# Patient Record
Sex: Male | Born: 1940 | Race: Black or African American | Hispanic: No | Smoking: Former smoker
Health system: Southern US, Community
[De-identification: ages and names within clinical notes are randomized; demographics above are authoritative.]

## PROBLEM LIST (undated history)

## (undated) DIAGNOSIS — J309 Allergic rhinitis, unspecified: Secondary | ICD-10-CM

## (undated) DIAGNOSIS — J439 Emphysema, unspecified: Secondary | ICD-10-CM

## (undated) DIAGNOSIS — N529 Male erectile dysfunction, unspecified: Secondary | ICD-10-CM

## (undated) DIAGNOSIS — M48061 Spinal stenosis, lumbar region without neurogenic claudication: Secondary | ICD-10-CM

## (undated) DIAGNOSIS — G4700 Insomnia, unspecified: Secondary | ICD-10-CM

## (undated) DIAGNOSIS — N400 Enlarged prostate without lower urinary tract symptoms: Secondary | ICD-10-CM

## (undated) DIAGNOSIS — Q396 Congenital diverticulum of esophagus: Secondary | ICD-10-CM

## (undated) DIAGNOSIS — I700 Atherosclerosis of aorta: Secondary | ICD-10-CM

## (undated) DIAGNOSIS — L568 Other specified acute skin changes due to ultraviolet radiation: Secondary | ICD-10-CM

## (undated) DIAGNOSIS — I495 Sick sinus syndrome: Secondary | ICD-10-CM

## (undated) DIAGNOSIS — M5030 Other cervical disc degeneration, unspecified cervical region: Secondary | ICD-10-CM

## (undated) DIAGNOSIS — C801 Malignant (primary) neoplasm, unspecified: Secondary | ICD-10-CM

## (undated) DIAGNOSIS — I10 Essential (primary) hypertension: Secondary | ICD-10-CM

## (undated) DIAGNOSIS — C61 Malignant neoplasm of prostate: Secondary | ICD-10-CM

## (undated) DIAGNOSIS — D369 Benign neoplasm, unspecified site: Secondary | ICD-10-CM

## (undated) DIAGNOSIS — M1990 Unspecified osteoarthritis, unspecified site: Secondary | ICD-10-CM

## (undated) DIAGNOSIS — G4733 Obstructive sleep apnea (adult) (pediatric): Secondary | ICD-10-CM

## (undated) DIAGNOSIS — E785 Hyperlipidemia, unspecified: Secondary | ICD-10-CM

## (undated) DIAGNOSIS — Z950 Presence of cardiac pacemaker: Secondary | ICD-10-CM

## (undated) DIAGNOSIS — G4730 Sleep apnea, unspecified: Secondary | ICD-10-CM

## (undated) DIAGNOSIS — E876 Hypokalemia: Secondary | ICD-10-CM

## (undated) DIAGNOSIS — I447 Left bundle-branch block, unspecified: Secondary | ICD-10-CM

## (undated) HISTORY — PX: INSERT / REPLACE / REMOVE PACEMAKER: SUR710

## (undated) HISTORY — PX: ANTERIOR CERVICAL DISCECTOMY: SHX1160

## (undated) HISTORY — PX: BACK SURGERY: SHX140

---

## 2005-02-03 ENCOUNTER — Ambulatory Visit: Payer: Self-pay | Admitting: Gastroenterology

## 2005-12-21 HISTORY — PX: HAMMER TOE SURGERY: SHX385

## 2008-02-28 ENCOUNTER — Ambulatory Visit: Payer: Self-pay | Admitting: Internal Medicine

## 2008-03-01 ENCOUNTER — Ambulatory Visit: Payer: Self-pay | Admitting: Gastroenterology

## 2008-04-11 ENCOUNTER — Ambulatory Visit (HOSPITAL_COMMUNITY): Admission: RE | Admit: 2008-04-11 | Discharge: 2008-04-12 | Payer: Self-pay | Admitting: Neurosurgery

## 2009-12-21 HISTORY — PX: PACEMAKER INSERTION: SHX728

## 2010-12-25 ENCOUNTER — Ambulatory Visit: Payer: Self-pay | Admitting: Unknown Physician Specialty

## 2011-03-06 ENCOUNTER — Ambulatory Visit: Payer: Self-pay | Admitting: Cardiology

## 2011-03-10 ENCOUNTER — Ambulatory Visit: Payer: Self-pay | Admitting: Cardiology

## 2011-03-14 ENCOUNTER — Emergency Department: Payer: Self-pay | Admitting: Emergency Medicine

## 2011-05-05 NOTE — Op Note (Signed)
Richard Alexander, Richard Alexander               ACCOUNT NO.:  [id number]   MEDICAL RECORD NO.:  [id number]          PATIENT TYPE:  OIB   LOCATION:  3172                         FACILITY:  [facility]   PHYSICIAN:  Hewitt Shorts, M.D.DATE OF BIRTH:  May 03, 1941   DATE OF PROCEDURE:  04/11/2008  DATE OF DISCHARGE:                               OPERATIVE REPORT   PREOPERATIVE DIAGNOSES:  1. C3-C4 cervical disk herniation.  2. Cervical spondylosis.  3. Cervical degenerative disease.  4. Neck pain.   POSTOPERATIVE DIAGNOSES:  1. C3-C4 cervical disk herniation.  2. Cervical spondylosis.  3. Cervical degenerative disease.  4. Neck pain.   PROCEDURE:  C3-C4 anterior cervical diskectomy arthrodesis with  allograft and Tether cervical plating.   SURGEON:  Hewitt Shorts, MD   ASSISTANT:  Clydene Fake, MD   ANESTHESIA:  General endotracheal.   HISTORY OF PRESENT ILLNESS:  The patient is a 70 year old man who  presented with neck and radicular pain, was found to have a broad based  C3-C4 cervical disk herniation with relative canal stenosis.  Decision  made to proceed with single level anterior cervical diskectomy  arthrodesis.   DESCRIPTION OF PROCEDURE:  The patient was brought to the operating room  and placed under general endotracheal anesthesia.  The patient was  placed in 10 pounds of Holter traction.  The neck was prepped with  Betadine soap and solution and draped in sterile fashion.  A horizontal  incision was made in the left side of the neck.  The line of the  incision was infiltrated with local anesthetic with epinephrine.  Dissection was carried down through the subcutaneous tissue with bipolar  electrocautery.  Electrocautery was used to maintain hemostasis.  Dissection was carried down to the platysma and then dissection was  carried out through the avascular plane leaving the sternocleidomastoid,  carotid artery, and jugular vein laterally and the trachea and esophagus  medially.  The ventral aspect of the vertebral column was identified and  localizing x-rays were taken and the C3-4 intervertebral disk space  identified.  Diskectomy was begun.  The annulus was incised.  We entered  into the disk space, and using pituitary rongeur and micro curettes,  diskectomy was performed.  Anterior osteophytic overgrowth was removed  using a Kerrison punch and then the operating microscope was draped and  brought into the field to provide additional magnification,  illumination, and visualization.  The remainder of the decompression was  performed using microdissection and microsurgical technique.  The  cartilaginous endplates were then further removed using microcurettes  along with the X-Max drill and then posterior osteophytic overgrowth was  removed using the X-Max drill along with a 2-mm Kerrison punch with a  central footplate.  The posterior longitudinal ligament was carefully  removed and we decompressed the spinal canal and thecal sac.  The  decompression extended laterally and we identified the axillary C4  nerves and those were felt to have been decompressed, and the wounds  were irrigated with Bacitracin solution.  Hemostasis was established  with the use of Gelfoam soaked in thrombin, and once decompression  had  been completed, we measured the height of the intervertebral disk  spaces, selected an 8-mm interbody implant.  This allograft was hydrated  with saline solution and then positioned in the intervertebral disk  space and countersunk.  We then selected a 14-mm Tether cervical plate.  It was secured to the vertebra with 4-mm variable-angle screws. We used  a pair of 15-mm screws at C3, a 15-mm screw on the left side of the C4,  and a 12-mm screw on the right side of C4, and x-ray showed the graft,  plate, and screws all to be in good position.  All 4 screws had final  tightening performed, and then the wound was irrigated with Bacitracin  solution  and checked for hemostasis which was established and confirmed,  and then we proceeded with closure.  The platysma was closed with  interrupted and inverted 2-0 undyed Vicryl sutures.  The subcutaneous  and subcuticular layer were closed with interrupted inverted 3-0 undyed  Vicryl sutures.  The skin was reapproximated with Dermabond.  The  procedure was tolerated well.  The estimated blood loss was 50 mL.  Sponge and needle count were correct.  Following surgery, the patient  was placed in a soft cervical collar, reversed from the anesthetic,  extubated, and transferred to the recovery room for further care.      Hewitt Shorts, M.D.  Electronically Signed     RWN/MEDQ  D:  04/11/2008  T:  04/12/2008  Job:  [id number]

## 2011-09-07 ENCOUNTER — Ambulatory Visit: Payer: Self-pay | Admitting: Urology

## 2011-09-15 LAB — CBC
HCT: 42.8
Hemoglobin: 14.4
MCHC: 33.5
MCV: 92.9
Platelets: 303
RBC: 4.61
RDW: 14
WBC: 8.1

## 2013-06-08 ENCOUNTER — Ambulatory Visit: Payer: Self-pay | Admitting: Unknown Physician Specialty

## 2014-04-18 DIAGNOSIS — E7800 Pure hypercholesterolemia, unspecified: Secondary | ICD-10-CM | POA: Insufficient documentation

## 2014-04-18 DIAGNOSIS — I495 Sick sinus syndrome: Secondary | ICD-10-CM | POA: Insufficient documentation

## 2014-04-18 DIAGNOSIS — I10 Essential (primary) hypertension: Secondary | ICD-10-CM | POA: Insufficient documentation

## 2014-09-16 DIAGNOSIS — M5030 Other cervical disc degeneration, unspecified cervical region: Secondary | ICD-10-CM | POA: Insufficient documentation

## 2014-09-16 DIAGNOSIS — G4733 Obstructive sleep apnea (adult) (pediatric): Secondary | ICD-10-CM | POA: Insufficient documentation

## 2014-09-16 DIAGNOSIS — Z9989 Dependence on other enabling machines and devices: Secondary | ICD-10-CM

## 2014-09-16 DIAGNOSIS — G4700 Insomnia, unspecified: Secondary | ICD-10-CM | POA: Insufficient documentation

## 2014-09-16 DIAGNOSIS — Z86010 Personal history of colonic polyps: Secondary | ICD-10-CM | POA: Insufficient documentation

## 2014-09-17 DIAGNOSIS — E876 Hypokalemia: Secondary | ICD-10-CM | POA: Insufficient documentation

## 2014-09-17 DIAGNOSIS — D6489 Other specified anemias: Secondary | ICD-10-CM | POA: Insufficient documentation

## 2014-09-17 DIAGNOSIS — D649 Anemia, unspecified: Secondary | ICD-10-CM | POA: Insufficient documentation

## 2014-09-17 DIAGNOSIS — T502X5A Adverse effect of carbonic-anhydrase inhibitors, benzothiadiazides and other diuretics, initial encounter: Secondary | ICD-10-CM | POA: Insufficient documentation

## 2014-09-17 DIAGNOSIS — Z8639 Personal history of other endocrine, nutritional and metabolic disease: Secondary | ICD-10-CM | POA: Insufficient documentation

## 2015-09-25 DIAGNOSIS — Z79899 Other long term (current) drug therapy: Secondary | ICD-10-CM | POA: Insufficient documentation

## 2016-01-23 DIAGNOSIS — M5417 Radiculopathy, lumbosacral region: Secondary | ICD-10-CM | POA: Insufficient documentation

## 2016-04-09 DIAGNOSIS — R200 Anesthesia of skin: Secondary | ICD-10-CM | POA: Insufficient documentation

## 2016-04-15 ENCOUNTER — Other Ambulatory Visit: Payer: Self-pay | Admitting: Neurology

## 2016-04-15 DIAGNOSIS — M5417 Radiculopathy, lumbosacral region: Secondary | ICD-10-CM

## 2016-05-01 ENCOUNTER — Ambulatory Visit
Admission: RE | Admit: 2016-05-01 | Discharge: 2016-05-01 | Disposition: A | Discharge status: Home / Self Care | Payer: BC Managed Care – PPO | Source: Ambulatory Visit | Attending: Neurology | Admitting: Neurology

## 2016-05-01 DIAGNOSIS — M5417 Radiculopathy, lumbosacral region: Secondary | ICD-10-CM

## 2016-05-12 ENCOUNTER — Other Ambulatory Visit: Payer: Self-pay | Admitting: Internal Medicine

## 2016-05-12 DIAGNOSIS — M5417 Radiculopathy, lumbosacral region: Secondary | ICD-10-CM

## 2016-06-01 ENCOUNTER — Ambulatory Visit
Admission: RE | Admit: 2016-06-01 | Discharge: 2016-06-01 | Disposition: A | Discharge status: Home / Self Care | Payer: BC Managed Care – PPO | Source: Ambulatory Visit | Attending: Internal Medicine | Admitting: Internal Medicine

## 2016-06-01 DIAGNOSIS — M5417 Radiculopathy, lumbosacral region: Secondary | ICD-10-CM | POA: Diagnosis not present

## 2016-06-01 DIAGNOSIS — M5126 Other intervertebral disc displacement, lumbar region: Secondary | ICD-10-CM | POA: Diagnosis not present

## 2016-06-01 HISTORY — DX: Essential (primary) hypertension: I10

## 2016-06-01 LAB — POCT I-STAT CREATININE: CREATININE: 0.9 mg/dL (ref 0.61–1.24)

## 2016-06-01 MED ORDER — IOPAMIDOL (ISOVUE-300) INJECTION 61%
100.0000 mL | Freq: Once | INTRAVENOUS | Status: AC | PRN
  Administered 2016-06-01: 100 mL via INTRAVENOUS

## 2016-07-21 ENCOUNTER — Ambulatory Visit: Payer: BC Managed Care – PPO | Admitting: Pain Medicine

## 2016-09-14 ENCOUNTER — Other Ambulatory Visit: Payer: Self-pay | Admitting: Neurosurgery

## 2016-09-14 DIAGNOSIS — M48062 Spinal stenosis, lumbar region with neurogenic claudication: Secondary | ICD-10-CM

## 2016-09-14 DIAGNOSIS — G9519 Other vascular myelopathies: Secondary | ICD-10-CM

## 2016-09-14 DIAGNOSIS — M4800 Spinal stenosis, site unspecified: Secondary | ICD-10-CM

## 2016-09-21 ENCOUNTER — Other Ambulatory Visit: Payer: BC Managed Care – PPO

## 2016-09-22 ENCOUNTER — Ambulatory Visit: Payer: BC Managed Care – PPO

## 2016-09-25 ENCOUNTER — Ambulatory Visit: Payer: BC Managed Care – PPO

## 2016-09-25 ENCOUNTER — Other Ambulatory Visit: Payer: BC Managed Care – PPO

## 2016-09-29 ENCOUNTER — Ambulatory Visit
Admission: RE | Admit: 2016-09-29 | Discharge: 2016-09-29 | Disposition: A | Discharge status: Home / Self Care | Payer: BC Managed Care – PPO | Source: Ambulatory Visit | Attending: Neurosurgery | Admitting: Neurosurgery

## 2016-09-29 DIAGNOSIS — M48062 Spinal stenosis, lumbar region with neurogenic claudication: Secondary | ICD-10-CM | POA: Insufficient documentation

## 2016-09-29 DIAGNOSIS — M5127 Other intervertebral disc displacement, lumbosacral region: Secondary | ICD-10-CM | POA: Insufficient documentation

## 2016-09-29 DIAGNOSIS — M1288 Other specific arthropathies, not elsewhere classified, other specified site: Secondary | ICD-10-CM | POA: Insufficient documentation

## 2016-09-29 DIAGNOSIS — G9519 Other vascular myelopathies: Secondary | ICD-10-CM

## 2016-09-29 DIAGNOSIS — M4800 Spinal stenosis, site unspecified: Secondary | ICD-10-CM

## 2016-09-29 MED ORDER — IOPAMIDOL (ISOVUE-M 300) INJECTION 61%
15.0000 mL | Freq: Once | INTRAMUSCULAR | Status: AC | PRN
  Administered 2016-09-29: 20 mL via INTRATHECAL

## 2016-10-02 ENCOUNTER — Other Ambulatory Visit: Payer: BC Managed Care – PPO

## 2016-10-19 ENCOUNTER — Encounter
Admission: RE | Admit: 2016-10-19 | Discharge: 2016-10-19 | Disposition: A | Discharge status: Home / Self Care | Payer: BC Managed Care – PPO | Source: Ambulatory Visit | Attending: Neurosurgery | Admitting: Neurosurgery

## 2016-10-19 DIAGNOSIS — Z950 Presence of cardiac pacemaker: Secondary | ICD-10-CM | POA: Diagnosis not present

## 2016-10-19 DIAGNOSIS — N529 Male erectile dysfunction, unspecified: Secondary | ICD-10-CM | POA: Diagnosis not present

## 2016-10-19 DIAGNOSIS — M1990 Unspecified osteoarthritis, unspecified site: Secondary | ICD-10-CM | POA: Diagnosis not present

## 2016-10-19 DIAGNOSIS — G4733 Obstructive sleep apnea (adult) (pediatric): Secondary | ICD-10-CM | POA: Diagnosis not present

## 2016-10-19 DIAGNOSIS — Z01812 Encounter for preprocedural laboratory examination: Secondary | ICD-10-CM | POA: Insufficient documentation

## 2016-10-19 DIAGNOSIS — Z9989 Dependence on other enabling machines and devices: Secondary | ICD-10-CM | POA: Insufficient documentation

## 2016-10-19 DIAGNOSIS — I495 Sick sinus syndrome: Secondary | ICD-10-CM | POA: Diagnosis not present

## 2016-10-19 DIAGNOSIS — M5030 Other cervical disc degeneration, unspecified cervical region: Secondary | ICD-10-CM | POA: Insufficient documentation

## 2016-10-19 DIAGNOSIS — R200 Anesthesia of skin: Secondary | ICD-10-CM | POA: Insufficient documentation

## 2016-10-19 DIAGNOSIS — E785 Hyperlipidemia, unspecified: Secondary | ICD-10-CM | POA: Insufficient documentation

## 2016-10-19 DIAGNOSIS — D6489 Other specified anemias: Secondary | ICD-10-CM | POA: Diagnosis not present

## 2016-10-19 DIAGNOSIS — M5417 Radiculopathy, lumbosacral region: Secondary | ICD-10-CM | POA: Diagnosis not present

## 2016-10-19 DIAGNOSIS — I10 Essential (primary) hypertension: Secondary | ICD-10-CM | POA: Insufficient documentation

## 2016-10-19 HISTORY — DX: Presence of cardiac pacemaker: Z95.0

## 2016-10-19 LAB — URINALYSIS COMPLETE WITH MICROSCOPIC (ARMC ONLY)
BILIRUBIN URINE: NEGATIVE
Bacteria, UA: NONE SEEN
GLUCOSE, UA: NEGATIVE mg/dL
KETONES UR: NEGATIVE mg/dL
Leukocytes, UA: NEGATIVE
NITRITE: NEGATIVE
PROTEIN: NEGATIVE mg/dL
SPECIFIC GRAVITY, URINE: 1.013 (ref 1.005–1.030)
pH: 6 (ref 5.0–8.0)

## 2016-10-19 LAB — COMPREHENSIVE METABOLIC PANEL
ALK PHOS: 61 U/L (ref 38–126)
ALT: 22 U/L (ref 17–63)
ANION GAP: 9 (ref 5–15)
AST: 24 U/L (ref 15–41)
Albumin: 4 g/dL (ref 3.5–5.0)
BILIRUBIN TOTAL: 1 mg/dL (ref 0.3–1.2)
BUN: 12 mg/dL (ref 6–20)
CALCIUM: 9.8 mg/dL (ref 8.9–10.3)
CO2: 28 mmol/L (ref 22–32)
Chloride: 96 mmol/L — ABNORMAL LOW (ref 101–111)
Creatinine, Ser: 0.89 mg/dL (ref 0.61–1.24)
GLUCOSE: 100 mg/dL — AB (ref 65–99)
POTASSIUM: 3.9 mmol/L (ref 3.5–5.1)
Sodium: 133 mmol/L — ABNORMAL LOW (ref 135–145)
TOTAL PROTEIN: 7.9 g/dL (ref 6.5–8.1)

## 2016-10-19 LAB — CBC
HEMATOCRIT: 42.8 % (ref 40.0–52.0)
HEMOGLOBIN: 14.8 g/dL (ref 13.0–18.0)
MCH: 32.2 pg (ref 26.0–34.0)
MCHC: 34.5 g/dL (ref 32.0–36.0)
MCV: 93.2 fL (ref 80.0–100.0)
Platelets: 274 10*3/uL (ref 150–440)
RBC: 4.59 MIL/uL (ref 4.40–5.90)
RDW: 14.4 % (ref 11.5–14.5)
WBC: 9.8 10*3/uL (ref 3.8–10.6)

## 2016-10-19 LAB — PROTIME-INR
INR: 0.92
PROTHROMBIN TIME: 12.3 s (ref 11.4–15.2)

## 2016-10-19 LAB — SURGICAL PCR SCREEN
MRSA, PCR: NEGATIVE
STAPHYLOCOCCUS AUREUS: NEGATIVE

## 2016-10-19 LAB — TYPE AND SCREEN
ABO/RH(D): B POS
Antibody Screen: NEGATIVE

## 2016-10-19 LAB — APTT: aPTT: 33 seconds (ref 24–36)

## 2016-10-19 NOTE — Pre-Procedure Instructions (Addendum)
Sydnee Levans, MD - 09/23/2016 9:00 AM EDT Formatting of this note may be different from the original.   Chief Complaint: Chief Complaint  Patient presents with  . Follow-up  Myoview  Date of Service: 09/23/2016 Date of Birth: Dec 04, 1941 PCP: DAVID Raeanne Barry, MD  History of Present Illness: Mr. Verble is a 75 y.o.male patient who presents for follow-up visit. Is doing fairly well from a cardiac standpoint. He denies chest pain. He has no major complaints. 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 Device is programmed at a lower rate of 60 upper rate of 130 with a sense rate of 130 beats per minute . He has no chest pain or shortness of breath. He is able to carry out his daily activities without difficulty. He is contemplating back surgery. He underwent a functional study to assess for evidence of ischemia. 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 He is absolutely asymptomatic with moderate activity. I feel he is a low risk for surgery from a cardiac standpoint.   5. Patient appears to be a low risk for surgery from a cardiac standpoint. Would proceed with routine cardiac monitoring.

## 2016-10-19 NOTE — Pre-Procedure Instructions (Signed)
    [facility] [address] [place], [id number]  Procedure: Pharmacologic Myocardial Perfusion Imaging ONE day procedure  Indication: Atypical chest pain Plan: NM myocardial perfusion SPECT multiple (stress  and rest), ECG stress test only  Ordering Physician:   Dr. Bartholome Bill   Clinical History: 74 y.o. year old male Vitals: Height: 22 in Weight: 215 lb Cardiac risk factors include:  Pacemaker, Hyperlipidemia, HTN, Family Hx CAD and Obesity    Procedure:  Pharmacologic stress testing was performed with Regadenoson using a single  use 0.4mg /28ml (0.08 mg/ml) prefilled syringe intravenously infused as a  bolus dose. The stress test was stopped due to Infusion completion.Blood  pressure response was normal.  Rest HR: 76bpm Rest BP: 140/73mmHg Max HR: 87bpm Min BP: 130/42mmHg  Stress Test Administered by: Oswald Hillock, CMA  ECG Interpretation: Rest PH:[id number] sinus rhythm, LBBB Stress PH:[id number] sinus rhythm, LBBB Recovery PH:[id number] sinus rhythm ECG Interpretation:non-diagnostic due to pharmacologic testing.   Administrations This Visit  regadenoson (LEXISCAN) 0.4 mg/5 mL inj syringe 0.4 mg  Admin Date Action Dose Route Administered By      09/17/2016 Given 0.4 mg Intravenous Roxine Caddy Goard, CNMT      technetium Tc20m sestamibi (CARDIOLITE) injection [id number] millicurie  Admin Date Action Dose Route Administered By      0000000 Given [id number] millicurie Intravenous Scott N Goard, CNMT      technetium Tc62m sestamibi (CARDIOLITE) injection 0000000 millicurie  Admin Date Action Dose Route Administered By      0000000 Given 0000000 millicurie Intravenous Scott N Goard, CNMT        Gated post-stress perfusion imaging was performed 30 minutes after stress.  Rest images were performed 30 minutes after injection.  Gated  LV Analysis:  TID Ratio: 1.13  LVEF= 45%  FINDINGS: Regional wall motion:demonstrateshypokinesis of the inferoseptal wall. The overall quality of the study is good. Artifacts noted: no Left ventricular cavity: normal.  Perfusion Analysis:SPECT images demonstrate moderate perfusion  abnormality of moderate intensity is present in the inferoseptalregion  on the stress images.  09/17/16

## 2016-10-19 NOTE — Patient Instructions (Signed)
  Your procedure is scheduled on:Wednesday Nov. 8 , 2017. Report to Same Day Surgery. To find out your arrival time please call [phone] between 1PM - 3PM on Tuesday Nov. 7, 2017.  Remember: Instructions that are not followed completely may result in serious medical risk, up to and including death, or upon the discretion of your surgeon and anesthesiologist your surgery may need to be rescheduled.    _x___ 1. Do not eat food or drink liquids after midnight. No gum chewing or hard candies.     ____ 2. No Alcohol for 24 hours before or after surgery.   ____ 3. Bring all medications with you on the day of surgery if instructed.    __x__ 4. Notify your doctor if there is any change in your medical condition     (cold, fever, infections).    _____ 5. No smoking 24 hours prior to surgery.     Do not wear jewelry, make-up, hairpins, clips or nail polish.  Do not wear lotions, powders, or perfumes.   Do not shave 48 hours prior to surgery. Men may shave face and neck.  Do not bring valuables to the hospital.    [facility] is not responsible for any belongings or valuables.               Contacts, dentures or bridgework may not be worn into surgery.  Leave your suitcase in the car. After surgery it may be brought to your room.  For patients admitted to the hospital, discharge time is determined by your treatment team.   Patients discharged the day of surgery will not be allowed to drive home.    Please read over the following fact sheets that you were given:   [facility] Preparing for Surgery  __x__ Take these medicines the morning of surgery with A SIP OF WATER:    1. gabapentin (NEURONTIN)    ____ Fleet Enema (as directed)   _x___ Use CHG Soap as directed on instruction sheet  ____ Use inhalers on the day of surgery and bring to hospital day of surgery  ____ Stop metformin 2 days prior to surgery    ____ Take 1/2 of usual insulin dose the night before surgery and none on  the morning of  surgery.   __x__ Stop aspirin 7 days prior to surgery.  _x___ Stop Anti-inflammatories such as Advil, Aleve, Ibuprofen, Motrin, Naproxen, Naprosyn, Goodies powders or aspirin products. OK to take Tylenol.   ____ Stop supplements until after surgery.    _x___ Bring C-Pap to the hospital.

## 2016-10-20 ENCOUNTER — Encounter: Payer: Self-pay | Admitting: Urology

## 2016-10-20 ENCOUNTER — Ambulatory Visit (INDEPENDENT_AMBULATORY_CARE_PROVIDER_SITE_OTHER): Payer: Medicare Other | Admitting: Urology

## 2016-10-20 VITALS — BP 155/83 | HR 81 | Ht 65.0 in | Wt 218.3 lb

## 2016-10-20 DIAGNOSIS — J309 Allergic rhinitis, unspecified: Secondary | ICD-10-CM | POA: Insufficient documentation

## 2016-10-20 DIAGNOSIS — N529 Male erectile dysfunction, unspecified: Secondary | ICD-10-CM | POA: Diagnosis not present

## 2016-10-20 DIAGNOSIS — R972 Elevated prostate specific antigen [PSA]: Secondary | ICD-10-CM

## 2016-10-20 DIAGNOSIS — L568 Other specified acute skin changes due to ultraviolet radiation: Secondary | ICD-10-CM | POA: Insufficient documentation

## 2016-10-20 DIAGNOSIS — M75102 Unspecified rotator cuff tear or rupture of left shoulder, not specified as traumatic: Secondary | ICD-10-CM | POA: Insufficient documentation

## 2016-10-20 DIAGNOSIS — N401 Enlarged prostate with lower urinary tract symptoms: Secondary | ICD-10-CM

## 2016-10-20 DIAGNOSIS — N138 Other obstructive and reflux uropathy: Secondary | ICD-10-CM

## 2016-10-20 DIAGNOSIS — M1990 Unspecified osteoarthritis, unspecified site: Secondary | ICD-10-CM | POA: Insufficient documentation

## 2016-10-20 MED ORDER — FINASTERIDE 5 MG PO TABS: 5.0000 mg | ORAL_TABLET | Freq: Every day | ORAL | 12 refills | Status: DC

## 2016-10-20 NOTE — Patient Instructions (Addendum)
Finasteride (Proscar) tablets What is this medicine? FINASTERIDE (fi NAS teer ide) is used to treat benign prostatic hyperplasia (BPH) in men. This is a condition that causes you to have an enlarged prostate. This medicine helps to control your symptoms, decrease urinary retention, and reduces your risk of needing surgery. When used in combination with certain other medicines, this drug can slow down the progression of your disease. This medicine may be used for other purposes; ask your health care provider or pharmacist if you have questions. What should I tell my health care provider before I take this medicine? They need to know if you have any of these conditions: -liver disease -an unusual or allergic reaction to finasteride, other medicines, foods, dyes, or preservatives -pregnant or trying to get pregnant -breast-feeding How should I use this medicine? Take this medicine by mouth with a glass of water. Follow the directions on the prescription label. You can take this medicine with or without food. Take your doses at regular intervals. Do not take your medicine more often than directed. Do not stop taking except on the advice of your doctor or health care professional. Talk to your pediatrician regarding the use of this medicine in children. Special care may be needed. Overdosage: If you think you have taken too much of this medicine contact a poison control center or emergency room at once. NOTE: This medicine is only for you. Do not share this medicine with others. What if I miss a dose? If you miss a dose, take it as soon as you can. If it is almost time for your next dose, take only that dose. Do not take double or extra doses. What may interact with this medicine? -saw palmetto or other dietary supplements This list may not describe all possible interactions. Give your health care provider a list of all the medicines, herbs, non-prescription drugs, or dietary supplements you use. Also  tell them if you smoke, drink alcohol, or use illegal drugs. Some items may interact with your medicine. What should I watch for while using this medicine? Do not donate blood while you are taking this medicine. This will prevent giving this medicine to a pregnant male through a blood transfusion. Ask your doctor or health care professional when it is safe to donate blood after you stop taking this medicine. Women who are pregnant or may get pregnant must not handle broken or crushed finasteride tablets. The active ingredient could harm the unborn baby. If a pregnant woman comes into contact with broken or crushed tablets she should check with her doctor or health care professional. Exposure to whole tablets is not expected to cause harm as long as they are not swallowed. Contact your doctor or health care professional if your symptoms do not start to get better. You may need to take this medicine for 6 to 12 months to get the best results. This medicine can interfere with PSA laboratory tests for prostate cancer. If you are scheduled to have a lab test for prostate cancer, tell your doctor or health care professional that you are taking this medicine. This medicine may increase your risk of getting some cancers, like breast cancer. Talk with your doctor. What side effects may I notice from receiving this medicine? Side effects that you should report to your doctor or health care professional as soon as possible: -any signs of an allergic reaction like rash, itching, hives or swelling of the lips or face -changes in breast like lumps, pain or fluids  leaking from the nipple -pain in the testicles Side effects that usually do not require medical attention (report to your doctor or health care professional if they continue or are bothersome): -sexual difficulties like decreased sexual desire or ability to get an erection -small amount of semen released during sex This list may not describe all possible  side effects. Call your doctor for medical advice about side effects. You may report side effects to FDA at 1-800-FDA-1088. Where should I keep my medicine? Keep out of the reach of children. Store at room temperature below 30 degrees C (86 degrees F). Protect from light. Keep container tightly closed. Throw away any unused medicine after the expiration date. NOTE: This sheet is a summary. It may not cover all possible information. If you have questions about this medicine, talk to your doctor, pharmacist, or health care provider.    2016, Elsevier/Gold Standard. (2015-07-25 17:24:30) Sildenafil tablets (Viagra) What is this medicine? SILDENAFIL (sil DEN a fil) is used to treat erection problems in men. This medicine may be used for other purposes; ask your health care provider or pharmacist if you have questions. What should I tell my health care provider before I take this medicine? They need to know if you have any of these conditions: -bleeding disorders -eye or vision problems, including a rare inherited eye disease called retinitis pigmentosa -anatomical deformation of the penis, Peyronie's disease, or history of priapism (painful and prolonged erection) -heart disease, angina, a history of heart attack, irregular heart beats, or other heart problems -high or low blood pressure -history of blood diseases, like sickle cell anemia or leukemia -history of stomach bleeding -kidney disease -liver disease -stroke -an unusual or allergic reaction to sildenafil, other medicines, foods, dyes, or preservatives -pregnant or trying to get pregnant -breast-feeding How should I use this medicine? Take this medicine by mouth with a glass of water. Follow the directions on the prescription label. The dose is usually taken 1 hour before sexual activity. You should not take the dose more than once per day. Do not take your medicine more often than directed. Talk to your pediatrician regarding the  use of this medicine in children. This medicine is not used in children for this condition. Overdosage: If you think you have taken too much of this medicine contact a poison control center or emergency room at once. NOTE: This medicine is only for you. Do not share this medicine with others. What if I miss a dose? This does not apply. Do not take double or extra doses. What may interact with this medicine? Do not take this medicine with any of the following medications: -cisapride -methscopolamine nitrate -nitrates like amyl nitrite, isosorbide dinitrate, isosorbide mononitrate, nitroglycerin -nitroprusside -other medicines for erectile dysfunction like avanafil, tadalafil, vardenafil -riociguat -other sildenafil products (Revatio) This medicine may also interact with the following medications: -certain drugs for high blood pressure -certain drugs for the treatment of HIV infection or AIDS -certain drugs used for fungal or yeast infections, like fluconazole, itraconazole, ketoconazole, and voriconazole -cimetidine -erythromycin -rifampin This list may not describe all possible interactions. Give your health care provider a list of all the medicines, herbs, non-prescription drugs, or dietary supplements you use. Also tell them if you smoke, drink alcohol, or use illegal drugs. Some items may interact with your medicine. What should I watch for while using this medicine? If you notice any changes in your vision while taking this drug, call your doctor or health care professional as soon as possible.  Stop using this medicine and call your health care provider right away if you have a loss of sight in one or both eyes. Contact your doctor or health care professional right away if you have an erection that lasts longer than 4 hours or if it becomes painful. This may be a sign of a serious problem and must be treated right away to prevent permanent damage. If you experience symptoms of nausea,  dizziness, chest pain or arm pain upon initiation of sexual activity after taking this medicine, you should refrain from further activity and call your doctor or health care professional as soon as possible. Do not drink alcohol to excess (examples, 5 glasses of wine or 5 shots of whiskey) when taking this medicine. When taken in excess, alcohol can increase your chances of getting a headache or getting dizzy, increasing your heart rate or lowering your blood pressure. Using this medicine does not protect you or your partner against HIV infection (the virus that causes AIDS) or other sexually transmitted diseases. What side effects may I notice from receiving this medicine? Side effects that you should report to your doctor or health care professional as soon as possible: -allergic reactions like skin rash, itching or hives, swelling of the face, lips, or tongue -breathing problems -changes in hearing -changes in vision -chest pain -fast, irregular heartbeat -prolonged or painful erection -seizures Side effects that usually do not require medical attention (report to your doctor or health care professional if they continue or are bothersome): -back pain -dizziness -flushing -headache -indigestion -muscle aches -nausea -stuffy or runny nose This list may not describe all possible side effects. Call your doctor for medical advice about side effects. You may report side effects to FDA at 1-800-FDA-1088. Where should I keep my medicine? Keep out of reach of children. Store at room temperature between 15 and 30 degrees C (59 and 86 degrees F). Throw away any unused medicine after the expiration date. NOTE: This sheet is a summary. It may not cover all possible information. If you have questions about this medicine, talk to your doctor, pharmacist, or health care provider.    2016, Elsevier/Gold Standard. (2014-04-27 13:19:04)

## 2016-10-20 NOTE — Progress Notes (Signed)
MRSA and Staph Aureus are both negative.

## 2016-10-20 NOTE — Progress Notes (Signed)
10/20/2016 11:51 AM   Richard Alexander Fila 01/11/1941 [id number]  Referring provider: Ezequiel Kayser, MD [facility] [place], [place] [address]  Chief Complaint  Patient presents with  . Elevated PSA    HPI: Patient is a 75 year old African American male who presents today for an elevated PSA by Dr. Raechel Ache.    Patient was found to have a PSA of 7.12 ng/mL on 09/25/2016 during a follow up with his PCP.  He was complaining of daytime frequency and mild dribbling.  He also has nocturia x 1-3.  Patient has sleep apnea.  He was initiated on tamsulosin 0.4 mg daily.    His IPSS score today is 8, which is moderate lower urinary tract symptomatology.  He is mostly satisfied with his quality life due to his urinary symptoms.        His major complaint today daytime frequency and urgency.  He has had these symptoms for over the last year.  He denies any dysuria, hematuria or suprapubic pain.     No previous PSA on record.       He also denies any recent fevers, chills, nausea or vomiting.  He does not have a family history of PCa.      IPSS    Row Name 10/20/16 1100         International Prostate Symptom Score   How often have you had the sensation of not emptying your bladder? Less than half the time     How often have you had to urinate less than every two hours? Less than 1 in 5 times     How often have you found you stopped and started again several times when you urinated? Not at All     How often have you found it difficult to postpone urination? More than half the time     How often have you had a weak urinary stream? Not at All     How often have you had to strain to start urination? Not at All     How many times did you typically get up at night to urinate? 1 Time     Total IPSS Score 8       Quality of Life due to urinary symptoms   If you were to spend the rest of your life with your urinary condition just the way it is now how would you feel  about that? Mostly Satisfied        Score:  1-7 Mild 8-19 Moderate 20-35 Severe  Erectile dysfunction His SHIM score is 12, which is mild to moderate ED.   He has been having difficulty with erections for one year.   His major complaint is achieving and maintaining.  His libido is preserved.   His risk factors for ED are age, BPH, lumbosacral radiculopathy, HTN, HLD, sleep apnea and blood pressure medications.   He denies any painful erections or curvatures with his erections.   He has tried Viagra in the past, but it was not effective.        SHIM    Row Name 10/20/16 1122         SHIM: Over the last 6 months:   How do you rate your confidence that you could get and keep an erection? Low     When you had erections with sexual stimulation, how often were your erections hard enough for penetration (entering your partner)? Sometimes (about half the time)  During sexual intercourse, how often were you able to maintain your erection after you had penetrated (entered) your partner? Very Difficult     During sexual intercourse, how difficult was it to maintain your erection to completion of intercourse? Difficult     When you attempted sexual intercourse, how often was it satisfactory for you? Very Difficult       SHIM Total Score   SHIM 12        Score: 1-7 Severe ED 8-11 Moderate ED 12-16 Mild-Moderate ED 17-21 Mild ED 22-25 No ED      PMH: Past Medical History:  Diagnosis Date  . Hypertension   . Presence of permanent cardiac pacemaker     Surgical History: Past Surgical History:  Procedure Laterality Date  . ANTERIOR CERVICAL DISCECTOMY  ?  . HAMMER TOE SURGERY Right 2007   2nd toe  . PACEMAKER INSERTION  2011    Home Medications:    Medication List       Accurate as of 10/20/16 11:51 AM. Always use your most recent med list.          acetaminophen 500 MG tablet Commonly known as:  TYLENOL Take 1,000 mg by mouth every 4 (four) hours as needed for  mild pain or moderate pain.   aspirin EC 81 MG tablet Take 81 mg by mouth daily.   gabapentin 100 MG capsule Commonly known as:  NEURONTIN Take 100 mg by mouth 3 (three) times daily.   hydrochlorothiazide 25 MG tablet Commonly known as:  HYDRODIURIL Take 25 mg by mouth daily.   polyethylene glycol packet Commonly known as:  MIRALAX / GLYCOLAX Take 17 g by mouth daily as needed.   potassium chloride SA 20 MEQ tablet Commonly known as:  K-DUR,KLOR-CON Take 20 mEq by mouth daily.   pravastatin 20 MG tablet Commonly known as:  PRAVACHOL Take 20 mg by mouth daily.   tamsulosin 0.4 MG Caps capsule Commonly known as:  FLOMAX Take 0.4 mg by mouth daily.       Allergies: No Known Allergies  Family History: Family History  Problem Relation Age of Onset  . Hypertension Father   . Stroke Father   . Aortic aneurysm Mother   . Prostate cancer Neg Hx   . Kidney cancer Neg Hx   . Bladder Cancer Neg Hx     Social History:  reports that he quit smoking about 41 years ago. His smoking use included Cigarettes. He has never used smokeless tobacco. He reports that he does not drink alcohol or use drugs.  ROS: UROLOGY Frequent Urination?: Yes Hard to postpone urination?: No Burning/pain with urination?: No Get up at night to urinate?: Yes Leakage of urine?: No Urine stream starts and stops?: No Trouble starting stream?: No Do you have to strain to urinate?: No Blood in urine?: No Urinary tract infection?: No Sexually transmitted disease?: No Injury to kidneys or bladder?: No Painful intercourse?: No Weak stream?: No Erection problems?: No Penile pain?: No  Gastrointestinal Nausea?: No Vomiting?: No Indigestion/heartburn?: No Diarrhea?: No Constipation?: No  Constitutional Fever: No Night sweats?: No Weight loss?: No Fatigue?: No  Skin Skin rash/lesions?: No Itching?: No  Eyes Blurred vision?: No Double vision?: No  Ears/Nose/Throat Sore throat?:  No Sinus problems?: No  Hematologic/Lymphatic Swollen glands?: No Easy bruising?: No  Cardiovascular Leg swelling?: No Chest pain?: No  Respiratory Cough?: No Shortness of breath?: No  Endocrine Excessive thirst?: No  Musculoskeletal Back pain?: Yes Joint pain?: Yes  Neurological  Headaches?: No Dizziness?: No  Psychologic Depression?: No Anxiety?: No  Physical Exam: BP (!) 155/83 (BP Location: Left Arm, Patient Position: Sitting, Cuff Size: Large)   Pulse 81   Ht 5\' 5"  (1.651 m)   Wt 218 lb 4.8 oz (99 kg)   BMI 36.33 kg/m   Constitutional: Well nourished. Alert and oriented, No acute distress. HEENT: [place] AT, moist mucus membranes. Trachea midline, no masses. Cardiovascular: No clubbing, cyanosis, or edema. Respiratory: Normal respiratory effort, no increased work of breathing. GI: Abdomen is soft, non tender, non distended, no abdominal masses. Liver and spleen not palpable.  No hernias appreciated.  Stool sample for occult testing is not indicated.   GU: No CVA tenderness.  No bladder fullness or masses.  Patient with uncircumcised phallus. Foreskin easily retracted Urethral meatus is patent.  No penile discharge. No penile lesions or rashes. Scrotum without lesions, cysts, rashes and/or edema.  Testicles are located scrotally bilaterally. No masses are appreciated in the testicles. Left and right epididymis are normal. Rectal: Patient with  normal sphincter tone. Anus and perineum without scarring or rashes. No rectal masses are appreciated. Prostate is approximately 60 grams, no nodules are appreciated. Seminal vesicles are normal. Skin: No rashes, bruises or suspicious lesions. Lymph: No cervical or inguinal adenopathy. Neurologic: Grossly intact, no focal deficits, moving all 4 extremities. Psychiatric: Normal mood and affect.  Laboratory Data: PSA History  7.12 ng/mL on 09/25/2016  Lab Results  Component Value Date   WBC 9.8 10/19/2016   HGB 14.8  10/19/2016   HCT 42.8 10/19/2016   MCV 93.2 10/19/2016   PLT 274 10/19/2016    Lab Results  Component Value Date   CREATININE 0.89 10/19/2016    Lab Results  Component Value Date   AST 24 10/19/2016   Lab Results  Component Value Date   ALT 22 10/19/2016     Assessment & Plan:    1. Elevated PSA  - I discussed with the patient that PSA is an acronym for  prostate specific antigen,  which is a protein made by the prostate gland and can be detected in the blood stream. I explained to the patient situations that would increase the PSA, such as: a man's age,  BPH, infection, recent intercourse/ejaculation, prostate infarction, recent urethroscopic manipulation (Foley placement/cystoscopy) and prostate cancer.   -  At this time, I have advised the patient that we will repeat the PSA to rule out lab error.  If that should return elevated, we could continue observation or pursue a prostate biopsy- though I would recommend observation as patient has a large prostate and would like to see how he responds to finasteride  - PSA  2. BPH with LUTS  - IPSS score is 8/2  - Continue conservative management, avoiding bladder irritants and timed voiding's  - Initiate 5 alpha reductase inhibitor (finasteride), discussed side effects  - Continue tamsulosin 0.4 mg daily  - RTC in 3 months for IPSS, PSA, PVR and exam   3. Erectile dysfunction  - SHIM score is 12   - I explained to the patient that in order to achieve an erection it takes good functioning of the nervous system (parasympathetic, sympathetic, sensory and motor), good blood flow into the erectile tissue of the penis and a desire to have sex  - I explained that conditions like diabetes, hypertension, coronary artery disease, peripheral vascular disease, smoking, alcohol consumption, age, sleep apnea and BPH can diminish the ability to have an erection  -  We discussed trying Viagra again, intra-urethral suppositories, intracavernous  vasoactive drug injection therapy or  vacuum constriction device   - RTC in 3 months for repeat SHIM score and exam    Return in about 3 months (around 01/20/2017) for IPSS, PVR, PSA and exam.  These notes generated with voice recognition software. I apologize for typographical errors.  Zara Council, [facility] [address], [place], [place] [address] [phone]

## 2016-10-20 NOTE — Pre-Procedure Instructions (Signed)
Leah in [place] called and informed of pt's [employer] pacemaker.   Also made a comment under special needs in surgical case section of epic.

## 2016-10-21 ENCOUNTER — Telehealth: Payer: Self-pay

## 2016-10-21 LAB — PSA: PROSTATE SPECIFIC AG, SERUM: 8.4 ng/mL — AB (ref 0.0–4.0)

## 2016-10-21 NOTE — Telephone Encounter (Signed)
Labs added.

## 2016-10-21 NOTE — Telephone Encounter (Signed)
-----   Message from Nori Riis, PA-C sent at 10/21/2016  8:15 AM EDT ----- Would you add a free and total PSA to his blood work?

## 2016-10-22 ENCOUNTER — Telehealth: Payer: Self-pay

## 2016-10-22 DIAGNOSIS — R972 Elevated prostate specific antigen [PSA]: Secondary | ICD-10-CM

## 2016-10-22 NOTE — Telephone Encounter (Signed)
Spoke with pt wife and requested pt return phone call.

## 2016-10-22 NOTE — Telephone Encounter (Signed)
-----   Message from Nori Riis, PA-C sent at 10/22/2016  8:47 AM EDT ----- Please let the patient know that according to his blood work he has a 23 % probability of having prostate cancer.  I suggest to continue the finasteride and tamsulosin.  We will see him in three months.  We will be rechecking a PSA at that time.

## 2016-10-25 LAB — SPECIMEN STATUS REPORT

## 2016-10-25 LAB — PSA, TOTAL AND FREE
PROSTATE SPECIFIC AG, SERUM: 8.4 ng/mL — AB (ref 0.0–4.0)
PSA FREE PCT: 16.5 %
PSA, Free: 1.39 ng/mL

## 2016-10-26 NOTE — Telephone Encounter (Signed)
-----   Message from Nori Riis, PA-C sent at 10/22/2016  8:47 AM EDT ----- Please let the patient know that according to his blood work he has a 23 % probability of having prostate cancer.  I suggest to continue the finasteride and tamsulosin.  We will see him in three months.  We will be rechecking a PSA at that time.

## 2016-10-26 NOTE — Telephone Encounter (Signed)
Spoke with pt in reference to PSA results. Pt voiced understanding. Lab ordered.

## 2016-10-28 ENCOUNTER — Observation Stay
Admission: RE | Admit: 2016-10-28 | Discharge: 2016-10-29 | Disposition: A | Discharge status: Home / Self Care | Payer: BC Managed Care – PPO | Source: Ambulatory Visit | Attending: Neurosurgery | Admitting: Neurosurgery

## 2016-10-28 ENCOUNTER — Encounter
Admission: RE | Disposition: A | Discharge status: Home / Self Care | Payer: Self-pay | Source: Ambulatory Visit | Attending: Neurosurgery

## 2016-10-28 ENCOUNTER — Ambulatory Visit: Payer: BC Managed Care – PPO

## 2016-10-28 ENCOUNTER — Ambulatory Visit: Payer: BC Managed Care – PPO | Admitting: Anesthesiology

## 2016-10-28 ENCOUNTER — Encounter: Payer: Self-pay | Admitting: *Deleted

## 2016-10-28 DIAGNOSIS — M545 Low back pain, unspecified: Secondary | ICD-10-CM

## 2016-10-28 DIAGNOSIS — Z950 Presence of cardiac pacemaker: Secondary | ICD-10-CM | POA: Insufficient documentation

## 2016-10-28 DIAGNOSIS — I499 Cardiac arrhythmia, unspecified: Secondary | ICD-10-CM | POA: Insufficient documentation

## 2016-10-28 DIAGNOSIS — M1990 Unspecified osteoarthritis, unspecified site: Secondary | ICD-10-CM | POA: Diagnosis not present

## 2016-10-28 DIAGNOSIS — Z87891 Personal history of nicotine dependence: Secondary | ICD-10-CM | POA: Insufficient documentation

## 2016-10-28 DIAGNOSIS — M48062 Spinal stenosis, lumbar region with neurogenic claudication: Secondary | ICD-10-CM | POA: Diagnosis present

## 2016-10-28 DIAGNOSIS — M6281 Muscle weakness (generalized): Secondary | ICD-10-CM

## 2016-10-28 DIAGNOSIS — D649 Anemia, unspecified: Secondary | ICD-10-CM | POA: Diagnosis not present

## 2016-10-28 DIAGNOSIS — Z419 Encounter for procedure for purposes other than remedying health state, unspecified: Secondary | ICD-10-CM

## 2016-10-28 DIAGNOSIS — G4730 Sleep apnea, unspecified: Secondary | ICD-10-CM | POA: Insufficient documentation

## 2016-10-28 DIAGNOSIS — R262 Difficulty in walking, not elsewhere classified: Secondary | ICD-10-CM

## 2016-10-28 HISTORY — PX: LUMBAR LAMINECTOMY/DECOMPRESSION MICRODISCECTOMY: SHX5026

## 2016-10-28 LAB — ABO/RH: ABO/RH(D): B POS

## 2016-10-28 SURGERY — LUMBAR LAMINECTOMY/DECOMPRESSION MICRODISCECTOMY 2 LEVELS
Anesthesia: General | Laterality: Bilateral

## 2016-10-28 MED ORDER — BUPIVACAINE-EPINEPHRINE (PF) 0.5% -1:200000 IJ SOLN
INTRAMUSCULAR | Status: DC | PRN
  Administered 2016-10-28: 10 mL via PERINEURAL

## 2016-10-28 MED ORDER — CEFAZOLIN IN D5W 1 GM/50ML IV SOLN
1.0000 g | Freq: Three times a day (TID) | INTRAVENOUS | Status: AC
  Administered 2016-10-29: 1 g via INTRAVENOUS
  Filled 2016-10-28 (×3): qty 50

## 2016-10-28 MED ORDER — METHOCARBAMOL 500 MG PO TABS
500.0000 mg | ORAL_TABLET | Freq: Four times a day (QID) | ORAL | Status: DC | PRN
  Administered 2016-10-28 – 2016-10-29 (×3): 500 mg via ORAL
  Filled 2016-10-28 (×3): qty 1

## 2016-10-28 MED ORDER — LACTATED RINGERS IV SOLN
INTRAVENOUS | Status: DC
  Administered 2016-10-28: 07:00:00 via INTRAVENOUS

## 2016-10-28 MED ORDER — SUCCINYLCHOLINE CHLORIDE 20 MG/ML IJ SOLN
INTRAMUSCULAR | Status: DC | PRN
  Administered 2016-10-28: 100 mg via INTRAVENOUS

## 2016-10-28 MED ORDER — BUPIVACAINE-EPINEPHRINE (PF) 0.5% -1:200000 IJ SOLN
INTRAMUSCULAR | Status: AC
  Filled 2016-10-28: qty 30

## 2016-10-28 MED ORDER — ONDANSETRON HCL 4 MG/2ML IJ SOLN
INTRAMUSCULAR | Status: DC | PRN
  Administered 2016-10-28: 4 mg via INTRAVENOUS

## 2016-10-28 MED ORDER — HYDROCHLOROTHIAZIDE 25 MG PO TABS
25.0000 mg | ORAL_TABLET | Freq: Every day | ORAL | Status: DC
  Administered 2016-10-28 – 2016-10-29 (×2): 25 mg via ORAL
  Filled 2016-10-28 (×2): qty 1

## 2016-10-28 MED ORDER — ROCURONIUM BROMIDE 100 MG/10ML IV SOLN
INTRAVENOUS | Status: DC | PRN
  Administered 2016-10-28: 5 mg via INTRAVENOUS
  Administered 2016-10-28: 10 mg via INTRAVENOUS
  Administered 2016-10-28: 40 mg via INTRAVENOUS
  Administered 2016-10-28: 5 mg via INTRAVENOUS
  Administered 2016-10-28: 10 mg via INTRAVENOUS

## 2016-10-28 MED ORDER — SODIUM CHLORIDE 0.9 % IJ SOLN
INTRAMUSCULAR | Status: AC
  Filled 2016-10-28: qty 10

## 2016-10-28 MED ORDER — LIDOCAINE HCL (CARDIAC) 20 MG/ML IV SOLN
INTRAVENOUS | Status: DC | PRN
  Administered 2016-10-28: 100 mg via INTRAVENOUS

## 2016-10-28 MED ORDER — LACTATED RINGERS IV SOLN
INTRAVENOUS | Status: DC
  Administered 2016-10-28 (×2): via INTRAVENOUS

## 2016-10-28 MED ORDER — GELATIN ABSORBABLE 12-7 MM EX MISC
CUTANEOUS | Status: AC
  Filled 2016-10-28: qty 1

## 2016-10-28 MED ORDER — ONDANSETRON HCL 4 MG/2ML IJ SOLN: 4.0000 mg | Freq: Once | INTRAMUSCULAR | Status: DC | PRN

## 2016-10-28 MED ORDER — FINASTERIDE 5 MG PO TABS
5.0000 mg | ORAL_TABLET | Freq: Every day | ORAL | Status: DC
  Administered 2016-10-28 – 2016-10-29 (×2): 5 mg via ORAL
  Filled 2016-10-28 (×2): qty 1

## 2016-10-28 MED ORDER — BUPIVACAINE LIPOSOME 1.3 % IJ SUSP
INTRAMUSCULAR | Status: AC
  Filled 2016-10-28: qty 20

## 2016-10-28 MED ORDER — PROPOFOL 10 MG/ML IV BOLUS
INTRAVENOUS | Status: DC | PRN
  Administered 2016-10-28: 150 mg via INTRAVENOUS

## 2016-10-28 MED ORDER — SODIUM CHLORIDE 0.9% FLUSH: 3.0000 mL | INTRAVENOUS | Status: DC | PRN

## 2016-10-28 MED ORDER — FENTANYL CITRATE (PF) 100 MCG/2ML IJ SOLN
25.0000 ug | INTRAMUSCULAR | Status: DC | PRN
  Administered 2016-10-28 (×5): 25 ug via INTRAVENOUS

## 2016-10-28 MED ORDER — FENTANYL CITRATE (PF) 100 MCG/2ML IJ SOLN
INTRAMUSCULAR | Status: DC | PRN
  Administered 2016-10-28: 50 ug via INTRAVENOUS
  Administered 2016-10-28: 100 ug via INTRAVENOUS
  Administered 2016-10-28 (×2): 50 ug via INTRAVENOUS

## 2016-10-28 MED ORDER — THROMBIN 5000 UNITS EX SOLR
CUTANEOUS | Status: AC
  Filled 2016-10-28: qty 5000

## 2016-10-28 MED ORDER — SUGAMMADEX SODIUM 200 MG/2ML IV SOLN
INTRAVENOUS | Status: DC | PRN
Start: 2016-10-28 — End: 2016-10-28
  Administered 2016-10-28: 200 mg via INTRAVENOUS

## 2016-10-28 MED ORDER — FENTANYL CITRATE (PF) 100 MCG/2ML IJ SOLN
INTRAMUSCULAR | Status: AC
  Filled 2016-10-28: qty 2

## 2016-10-28 MED ORDER — POTASSIUM CHLORIDE CRYS ER 20 MEQ PO TBCR
20.0000 meq | EXTENDED_RELEASE_TABLET | Freq: Every day | ORAL | Status: DC
  Administered 2016-10-28 – 2016-10-29 (×2): 20 meq via ORAL
  Filled 2016-10-28 (×2): qty 1

## 2016-10-28 MED ORDER — METHYLPREDNISOLONE ACETATE 40 MG/ML IJ SUSP
INTRAMUSCULAR | Status: DC | PRN
  Administered 2016-10-28: 40 mg

## 2016-10-28 MED ORDER — GELATIN ABSORBABLE 12-7 MM EX MISC
CUTANEOUS | Status: DC | PRN
  Administered 2016-10-28: 1

## 2016-10-28 MED ORDER — BACITRACIN 50000 UNITS IM SOLR
INTRAMUSCULAR | Status: AC
  Filled 2016-10-28: qty 1

## 2016-10-28 MED ORDER — SODIUM CHLORIDE 0.9% FLUSH
INTRAVENOUS | Status: DC | PRN
  Administered 2016-10-28: 10 mL via INTRAVENOUS

## 2016-10-28 MED ORDER — CEFAZOLIN SODIUM-DEXTROSE 2-4 GM/100ML-% IV SOLN
2.0000 g | Freq: Once | INTRAVENOUS | Status: AC
  Administered 2016-10-28: 2 g via INTRAVENOUS

## 2016-10-28 MED ORDER — METHOCARBAMOL 1000 MG/10ML IJ SOLN
500.0000 mg | Freq: Four times a day (QID) | INTRAVENOUS | Status: DC | PRN
  Filled 2016-10-28: qty 5

## 2016-10-28 MED ORDER — FAMOTIDINE 20 MG PO TABS
20.0000 mg | ORAL_TABLET | Freq: Once | ORAL | Status: AC
  Administered 2016-10-28: 20 mg via ORAL

## 2016-10-28 MED ORDER — ONDANSETRON HCL 4 MG/2ML IJ SOLN: 4.0000 mg | INTRAMUSCULAR | Status: DC | PRN

## 2016-10-28 MED ORDER — TAMSULOSIN HCL 0.4 MG PO CAPS
0.4000 mg | ORAL_CAPSULE | Freq: Every day | ORAL | Status: DC
  Administered 2016-10-28 – 2016-10-29 (×2): 0.4 mg via ORAL
  Filled 2016-10-28 (×2): qty 1

## 2016-10-28 MED ORDER — SODIUM CHLORIDE 0.9 % IV SOLN: 250.0000 mL | INTRAVENOUS | Status: DC

## 2016-10-28 MED ORDER — ACETAMINOPHEN 500 MG PO TABS
1000.0000 mg | ORAL_TABLET | Freq: Four times a day (QID) | ORAL | Status: DC
  Administered 2016-10-28 – 2016-10-29 (×5): 1000 mg via ORAL
  Filled 2016-10-28 (×5): qty 2

## 2016-10-28 MED ORDER — HYDROCODONE-ACETAMINOPHEN 5-325 MG PO TABS
1.0000 | ORAL_TABLET | ORAL | Status: DC | PRN
  Administered 2016-10-28: 1 via ORAL
  Administered 2016-10-28: 2 via ORAL
  Administered 2016-10-28: 1 via ORAL
  Administered 2016-10-29: 2 via ORAL
  Filled 2016-10-28: qty 2
  Filled 2016-10-28: qty 1
  Filled 2016-10-28: qty 2
  Filled 2016-10-28 (×2): qty 1

## 2016-10-28 MED ORDER — METHYLPREDNISOLONE ACETATE 40 MG/ML IJ SUSP
INTRAMUSCULAR | Status: AC
  Filled 2016-10-28: qty 1

## 2016-10-28 MED ORDER — FAMOTIDINE 20 MG PO TABS
ORAL_TABLET | ORAL | Status: AC
  Filled 2016-10-28: qty 1

## 2016-10-28 MED ORDER — BUPIVACAINE LIPOSOME 1.3 % IJ SUSP
INTRAMUSCULAR | Status: DC | PRN
  Administered 2016-10-28: 40 mL

## 2016-10-28 MED ORDER — GABAPENTIN 600 MG PO TABS
300.0000 mg | ORAL_TABLET | Freq: Three times a day (TID) | ORAL | Status: DC
  Administered 2016-10-28 – 2016-10-29 (×4): 300 mg via ORAL
  Filled 2016-10-28: qty 1
  Filled 2016-10-28: qty 2
  Filled 2016-10-28 (×2): qty 1

## 2016-10-28 MED ORDER — FENTANYL CITRATE (PF) 100 MCG/2ML IJ SOLN
INTRAMUSCULAR | Status: AC
  Administered 2016-10-28: 25 ug via INTRAVENOUS
  Filled 2016-10-28: qty 2

## 2016-10-28 MED ORDER — MENTHOL 3 MG MT LOZG
1.0000 | LOZENGE | OROMUCOSAL | Status: DC | PRN
  Filled 2016-10-28: qty 9

## 2016-10-28 MED ORDER — THROMBIN 5000 UNITS EX SOLR
OROMUCOSAL | Status: DC | PRN
  Administered 2016-10-28: 15 mL via TOPICAL

## 2016-10-28 MED ORDER — SODIUM CHLORIDE 0.9% FLUSH
3.0000 mL | Freq: Two times a day (BID) | INTRAVENOUS | Status: DC
  Administered 2016-10-28: 3 mL via INTRAVENOUS

## 2016-10-28 MED ORDER — BACITRACIN 50000 UNITS IM SOLR
INTRAMUSCULAR | Status: DC | PRN
  Administered 2016-10-28: 50000 [IU]

## 2016-10-28 MED ORDER — CEFAZOLIN SODIUM-DEXTROSE 2-4 GM/100ML-% IV SOLN
INTRAVENOUS | Status: AC
  Filled 2016-10-28: qty 100

## 2016-10-28 MED ORDER — POTASSIUM CHLORIDE IN NACL 20-0.9 MEQ/L-% IV SOLN
INTRAVENOUS | Status: DC
  Filled 2016-10-28 (×4): qty 1000

## 2016-10-28 MED ORDER — POLYETHYLENE GLYCOL 3350 17 G PO PACK
17.0000 g | PACK | Freq: Once | ORAL | Status: AC
  Administered 2016-10-28: 17 g via ORAL
  Filled 2016-10-28: qty 1

## 2016-10-28 MED ORDER — PRAVASTATIN SODIUM 20 MG PO TABS
20.0000 mg | ORAL_TABLET | Freq: Every day | ORAL | Status: DC
  Administered 2016-10-29: 20 mg via ORAL
  Filled 2016-10-28 (×2): qty 1

## 2016-10-28 MED ORDER — PHENOL 1.4 % MT LIQD
1.0000 | OROMUCOSAL | Status: DC | PRN
  Filled 2016-10-28: qty 177

## 2016-10-28 SURGICAL SUPPLY — 60 items
BAND RUBBER 3X1/6 TAN STRL (MISCELLANEOUS) ×6 IMPLANT
BLADE BOVIE TIP EXT 4 (BLADE) ×3 IMPLANT
BUR NEURO DRILL SOFT 3.0X3.8M (BURR) ×3 IMPLANT
CANISTER SUCT 1200ML W/VALVE (MISCELLANEOUS) ×3 IMPLANT
CHLORAPREP W/TINT 26ML (MISCELLANEOUS) ×3 IMPLANT
CNTNR SPEC 2.5X3XGRAD LEK (MISCELLANEOUS) ×1
CONT SPEC 4OZ STER OR WHT (MISCELLANEOUS) ×2
CONTAINER SPEC 2.5X3XGRAD LEK (MISCELLANEOUS) ×1 IMPLANT
COUNTER NEEDLE 20/40 LG (NEEDLE) ×3 IMPLANT
COVER LIGHT HANDLE STERIS (MISCELLANEOUS) ×6 IMPLANT
CUP MEDICINE 2OZ PLAST GRAD ST (MISCELLANEOUS) ×6 IMPLANT
DERMABOND ADVANCED (GAUZE/BANDAGES/DRESSINGS) ×2
DERMABOND ADVANCED .7 DNX12 (GAUZE/BANDAGES/DRESSINGS) ×1 IMPLANT
DRAPE C-ARM XRAY 36X54 (DRAPES) ×6 IMPLANT
DRAPE LAPAROTOMY 100X77 ABD (DRAPES) ×3 IMPLANT
DRAPE MICROSCOPE LEICA (MISCELLANEOUS) ×3 IMPLANT
DRAPE POUCH INSTRU U-SHP 10X18 (DRAPES) ×3 IMPLANT
DRAPE SURG 17X11 SM STRL (DRAPES) ×12 IMPLANT
DRESSING TELFA 4X3 1S ST N-ADH (GAUZE/BANDAGES/DRESSINGS) IMPLANT
DRSG TEGADERM 4X4.75 (GAUZE/BANDAGES/DRESSINGS) IMPLANT
DURASEAL APPLICATOR TIP (TIP) IMPLANT
DURASEAL SPINE SEALANT 3ML (MISCELLANEOUS) IMPLANT
ELECT CAUTERY BLADE TIP 2.5 (TIP) ×3
ELECT EZSTD 165MM 6.5IN (MISCELLANEOUS) ×3
ELECTRODE CAUTERY BLDE TIP 2.5 (TIP) ×1 IMPLANT
ELECTRODE EZSTD 165MM 6.5IN (MISCELLANEOUS) ×1 IMPLANT
FRAME EYE SHIELD (PROTECTIVE WEAR) ×3 IMPLANT
GLOVE SURG SYN 8.5  E (GLOVE) ×6
GLOVE SURG SYN 8.5 E (GLOVE) ×3 IMPLANT
GOWN STRL REUS W/ TWL LRG LVL3 (GOWN DISPOSABLE) ×1 IMPLANT
GOWN STRL REUS W/ TWL XL LVL3 (GOWN DISPOSABLE) ×1 IMPLANT
GOWN STRL REUS W/TWL LRG LVL3 (GOWN DISPOSABLE) ×2
GOWN STRL REUS W/TWL XL LVL3 (GOWN DISPOSABLE) ×2
GRADUATE 1200CC STRL 31836 (MISCELLANEOUS) ×3 IMPLANT
KIT SPINAL PRONEVIEW (KITS) ×3 IMPLANT
KNIFE BAYONET SHORT DISCETOMY (MISCELLANEOUS) IMPLANT
MARKER SKIN DUAL TIP RULER LAB (MISCELLANEOUS) ×6 IMPLANT
NDL SAFETY ECLIPSE 18X1.5 (NEEDLE) ×1 IMPLANT
NEEDLE HYPO 18GX1.5 SHARP (NEEDLE) ×2
NEEDLE HYPO 22GX1.5 SAFETY (NEEDLE) ×3 IMPLANT
NS IRRIG 1000ML POUR BTL (IV SOLUTION) ×3 IMPLANT
PACK LAMINECTOMY NEURO (CUSTOM PROCEDURE TRAY) ×3 IMPLANT
PAD ARMBOARD 7.5X6 YLW CONV (MISCELLANEOUS) ×3 IMPLANT
PATTIES SURGICAL .5X1.5 (GAUZE/BANDAGES/DRESSINGS) IMPLANT
SPOGE SURGIFLO 8M (HEMOSTASIS) ×6
SPONGE SURGIFLO 8M (HEMOSTASIS) ×3 IMPLANT
STAPLER SKIN PROX 35W (STAPLE) IMPLANT
SUT DVC VLOC 3-0 CL 6 P-12 (SUTURE) ×6 IMPLANT
SUT NURALON 4 0 TR CR/8 (SUTURE) IMPLANT
SUT VIC AB 0 CT1 27 (SUTURE) ×4
SUT VIC AB 0 CT1 27XCR 8 STRN (SUTURE) ×2 IMPLANT
SUT VIC AB 2-0 CT1 18 (SUTURE) ×9 IMPLANT
SUT VIC AB 3-0 SH 27 (SUTURE) ×2
SUT VIC AB 3-0 SH 27X BRD (SUTURE) ×1 IMPLANT
SUT VICRYL 0 UR6 27IN ABS (SUTURE) ×3 IMPLANT
SYR 20CC LL (SYRINGE) ×6 IMPLANT
SYRINGE 10CC LL (SYRINGE) ×3 IMPLANT
TOWEL OR 17X26 4PK STRL BLUE (TOWEL DISPOSABLE) ×6 IMPLANT
TUBING CONNECTING 10 (TUBING) ×2 IMPLANT
TUBING CONNECTING 10' (TUBING) ×1

## 2016-10-28 NOTE — Anesthesia Procedure Notes (Signed)
Procedure Name: Intubation Performed by: Lance Muss Pre-anesthesia Checklist: Patient identified, Patient being monitored, Timeout performed, Emergency Drugs available and Suction available Patient Re-evaluated:Patient Re-evaluated prior to inductionOxygen Delivery Method: Circle system utilized Preoxygenation: Pre-oxygenation with 100% oxygen Intubation Type: IV induction Ventilation: Mask ventilation without difficulty, Two handed mask ventilation required and Oral airway inserted - appropriate to patient size Laryngoscope Size: 3 and Glidescope Grade View: Grade I Tube type: Oral Tube size: 7.5 mm Number of attempts: 1 Airway Equipment and Method: Rigid stylet and Video-laryngoscopy Placement Confirmation: ETT inserted through vocal cords under direct vision,  positive ETCO2 and breath sounds checked- equal and bilateral Secured at: 22 cm Tube secured with: Tape Dental Injury: Teeth and Oropharynx as per pre-operative assessment

## 2016-10-28 NOTE — NC FL2 (Signed)
[place] LEVEL OF CARE SCREENING TOOL     IDENTIFICATION  Patient Name: Richard Alexander Birthdate: 09-15-1941 Sex: male Admission Date (Current Location): 10/28/2016  [place] and [place] Number:  Engineering geologist and Address:  [facility], [address], [place], [place] [address]      Provider Number: [id number]  Attending Physician Name and Address:  Meade Maw, MD  Relative Name and Phone Number:       Current Level of Care: Hospital Recommended Level of Care: [place] Prior Approval Number:    Date Approved/Denied:   PASRR Number:  ([id number])  Discharge Plan: SNF    Current Diagnoses: Patient Active Problem List   Diagnosis Date Noted  . 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 Allergic rhinitis 10/20/2016  . Arthritis 10/20/2016  . Erectile dysfunction 10/20/2016  . Photoallergic dermatitis 10/20/2016  . Rotator cuff syndrome of left shoulder 10/20/2016  . Left leg numbness 04/09/2016  . Lumbosacral radiculopathy at S1 01/23/2016  . High risk medication use 09/25/2015  . Anemia due to medication 09/17/2014  . History of hypokalemia 09/17/2014  . DDD (degenerative disc disease), cervical 09/16/2014  . History of adenomatous polyp of colon 09/16/2014  . Insomnia 09/16/2014  . OSA on CPAP 09/16/2014  . Benign essential hypertension 04/18/2014  . Sinoatrial node dysfunction ([place]) 04/18/2014  . Pure hypercholesterolemia 04/18/2014    Orientation RESPIRATION BLADDER Height & Weight     Self, Time, Situation, Place  O2 (2 Liters Oxygen ) Continent Weight: 218 lb (98.9 kg) Height:  5\' 5"  (165.1 cm)  BEHAVIORAL SYMPTOMS/MOOD NEUROLOGICAL BOWEL NUTRITION STATUS   (none)  (none) Continent Diet (Diet: Regular )  AMBULATORY STATUS COMMUNICATION OF NEEDS Skin   Extensive Assist Verbally Surgical wounds (Incision: Back )                       Personal Care Assistance  Level of Assistance  Bathing, Feeding, Dressing Bathing Assistance: Limited assistance Feeding assistance: Independent Dressing Assistance: Limited assistance     Functional Limitations Info  Sight, Hearing, Speech Sight Info: Adequate Hearing Info: Adequate Speech Info: Adequate    SPECIAL CARE FACTORS FREQUENCY  PT (By licensed PT), OT (By licensed OT)     PT Frequency:  (5) OT Frequency:  (5)            Contractures      Additional Factors Info  Code Status, Allergies Code Status Info:  (Full Code. ) Allergies Info:  (No Known Allergies. )           Current Medications (10/28/2016):  This is the current hospital active medication list Current Facility-Administered Medications  Medication Dose Route Frequency Provider Last Rate Last Dose  . 0.9 %  sodium chloride infusion  250 mL Intravenous Continuous Meade Maw, MD      . 0.9 % NaCl with KCl 20 mEq/ L  infusion   Intravenous Continuous Meade Maw, MD      . acetaminophen (TYLENOL) tablet 1,000 mg  1,000 mg Oral Q6H Meade Maw, MD   1,000 mg at 10/28/16 1514  . ceFAZolin (ANCEF) IVPB 1 g/50 mL premix  1 g Intravenous Q8H Meade Maw, MD      . famotidine (PEPCID) 20 MG tablet           . fentaNYL (SUBLIMAZE) 100 MCG/2ML injection           . finasteride (PROSCAR) tablet 5 mg  5 mg Oral Daily Meade Maw, MD   5 mg at 10/28/16 1515  . gabapentin (NEURONTIN) tablet 300 mg  300 mg Oral TID Meade Maw, MD   300 mg at 10/28/16 1515  . hydrochlorothiazide (HYDRODIURIL) tablet 25 mg  25 mg Oral Daily Meade Maw, MD   25 mg at 10/28/16 1515  . HYDROcodone-acetaminophen (NORCO/VICODIN) 5-325 MG per tablet 1-2 tablet  1-2 tablet Oral Q4H PRN Meade Maw, MD   1 tablet at 10/28/16 1515  . lactated ringers infusion   Intravenous Continuous Gunnar Bulla, MD 50 mL/hr at 10/28/16 [phone]    . menthol-cetylpyridinium (CEPACOL) lozenge 3 mg  1 lozenge Oral PRN Meade Maw, MD        Or  . phenol (CHLORASEPTIC) mouth spray 1 spray  1 spray Mouth/Throat PRN Meade Maw, MD      . methocarbamol (ROBAXIN) tablet 500 mg  500 mg Oral Q6H PRN Meade Maw, MD       Or  . methocarbamol (ROBAXIN) 500 mg in dextrose 5 % 50 mL IVPB  500 mg Intravenous Q6H PRN Meade Maw, MD      . ondansetron [facility]) injection 4 mg  4 mg Intravenous Q4H PRN Meade Maw, MD      . potassium chloride SA (K-DUR,KLOR-CON) CR tablet 20 mEq  20 mEq Oral Daily Meade Maw, MD   20 mEq at 10/28/16 1515  . pravastatin (PRAVACHOL) tablet 20 mg  20 mg Oral Daily Meade Maw, MD      . sodium chloride flush (NS) 0.9 % injection 3 mL  3 mL Intravenous Q12H Meade Maw, MD      . sodium chloride flush (NS) 0.9 % injection 3 mL  3 mL Intravenous PRN Meade Maw, MD      . tamsulosin (FLOMAX) capsule 0.4 mg  0.4 mg Oral Daily Meade Maw, MD   0.4 mg at 10/28/16 1515     Discharge Medications: Please see discharge summary for a list of discharge medications.  Relevant Imaging Results:  Relevant Lab Results:   Additional Information  (SSN: [id number])  Lenix Benoist, [name], LCSW

## 2016-10-28 NOTE — H&P (Signed)
I have reviewed and confirmed my history and physical from 10/01/16 .  with no additions or changes. Plan for Lumbar decompression including laminectomy and facetectomy with foraminotomy from L3 to S1.  Risks and benefits reviewed.

## 2016-10-28 NOTE — Anesthesia Postprocedure Evaluation (Signed)
Anesthesia Post Note  Patient: CAMERAN OSTOS  Procedure(s) Performed: Procedure(s) (LRB): LUMBAR LAMINECTOMY/DECOMPRESSION MICRODISCECTOMY 3 LEVELS (Bilateral)  Patient location during evaluation: PACU Anesthesia Type: General Level of consciousness: awake and alert Pain management: pain level controlled Vital Signs Assessment: post-procedure vital signs reviewed and stable Respiratory status: spontaneous breathing, nonlabored ventilation, respiratory function stable and patient connected to nasal cannula oxygen Cardiovascular status: blood pressure returned to baseline and stable Postop Assessment: no signs of nausea or vomiting Anesthetic complications: no    Last Vitals:  Vitals:   10/28/16 1519 10/28/16 1602  BP: 130/74 126/69  Pulse: 75 74  Resp: 18 16  Temp: 36.9 C 36.6 C    Last Pain:  Vitals:   10/28/16 1602  TempSrc: Oral  PainSc:                  Izak Anding S

## 2016-10-28 NOTE — Anesthesia Preprocedure Evaluation (Signed)
Anesthesia Evaluation  Patient identified by MRN, date of birth, ID band Patient awake    Reviewed: Allergy & Precautions, NPO status , Patient's Chart, lab work & pertinent test results, reviewed documented beta blocker date and time   Airway Mallampati: III  TM Distance: >3 FB     Dental  (+) Chipped   Pulmonary sleep apnea and Continuous Positive Airway Pressure Ventilation , former smoker,           Cardiovascular hypertension, Pt. on medications + pacemaker      Neuro/Psych  Neuromuscular disease    GI/Hepatic   Endo/Other    Renal/GU      Musculoskeletal  (+) Arthritis ,   Abdominal   Peds  Hematology  (+) anemia ,   Anesthesia Other Findings Neck movement ok. Hx of arrhythmias.  Reproductive/Obstetrics                             Anesthesia Physical Anesthesia Plan  ASA: III  Anesthesia Plan: General   Post-op Pain Management:    Induction: Intravenous  Airway Management Planned: Oral ETT  Additional Equipment:   Intra-op Plan:   Post-operative Plan:   Informed Consent: I have reviewed the patients History and Physical, chart, labs and discussed the procedure including the risks, benefits and alternatives for the proposed anesthesia with the patient or authorized representative who has indicated his/her understanding and acceptance.     Plan Discussed with: CRNA  Anesthesia Plan Comments:         Anesthesia Quick Evaluation

## 2016-10-28 NOTE — Transfer of Care (Signed)
Immediate Anesthesia Transfer of Care Note  Patient: Richard Alexander  Procedure(s) Performed: Procedure(s) with comments: LUMBAR LAMINECTOMY/DECOMPRESSION MICRODISCECTOMY 3 LEVELS (Bilateral) - L3-4, L4-5,L5-S1 Laminectomies and bilateral foraminotomies  Patient Location: PACU  Anesthesia Type:General  Level of Consciousness: sedated  Airway & Oxygen Therapy: Patient Spontanous Breathing and Patient connected to face mask oxygen  Post-op Assessment: Report given to RN and Post -op Vital signs reviewed and stable  Post vital signs: Reviewed and stable  Last Vitals:  Vitals:   10/28/16 0608 10/28/16 1150  BP: 133/76   Pulse: 85 70  Resp: 18 17  Temp: 36.9 C 0000000 C    Complications: No apparent anesthesia complications

## 2016-10-28 NOTE — Op Note (Addendum)
Indications: Mr. Hammock is a 75 yo male who suffers from neurogenic claudication and was found to have severe lumbar stenosis from L3 to S1. He failed conservative management, and would like to proceed with surgery.  Findings: severe lumbar spondylosis and stenosis from L3 to S1  Preoperative Note:    Risks of surgery discussed include: infection, bleeding, stroke, coma, death, paralysis, CSF leak, nerve/spinal cord injury, numbness, tingling, weakness, complex regional pain syndrome, recurrent stenosis and/or disc herniation, vascular injury, development of instability, neck/back pain, need for further surgery, persistent symptoms, development of deformity, and the risks of anesthesia. They understood these risks and have agreed to proceed.  Operative Note:   The patient was then brought from the preoperative center with intravenous access established.  They underwent general anesthesia and endotracheal tube intubation.  They were then rotated on the [facility] table where all pressure points were appropriately padded.  An incision was marked with flouroscopy. The skin was then thoroughly cleansed.  Perioperative antibiotic prophylaxis was administered.  Sterile prep and drapes were then applied and a timeout was then observed.    Once this was complete an incision was opened with the use of a #10 blade knife.  The paraspinus muscles bilaterally were subperiosteally dissected until the facets was visualized. Flouroscopy was used to confirm the L3-4 level. A self-retaining retractor was placed. The exposure was the completed down to the S1 level with visualization of L3-4, L4-5, and L5-S1 facets bilaterally.   The microscope was then sterilely brought into the field. Using a high speed drill, complete L4 and L5 laminectomies were performed.  The inferior portion of the L3 lamina was also removed. Then, successively from L3-4, then L4-5, then L5-S1, the dure was dissected free of the ligamentum flavum  until the dura was visualized. Using curettes and Kerrison punches, complete bilateral central decompression was performed at each level. At L3-4, L4-5, and L5-S1, medial facetectomies and foraminotomies were performed until the bilateral L4, L5, and S1 nerve roots were noted to have complete decompression.  After laminectomy, foraminotomies, and facetectomies were performed at L3-4, L4-5, and L5-S1, a Woodson elevator was used to confirm decompression.  Floseal was placed in the gutters and no active bleeding was noted. The wound was copiously irrigated. The self-retaining retractor was removed and hemostasis achieved with bipolar electrocautery.  The wound was copiously irrigated an additional time, and the remainder of the Floseal was placed along the muscles. 20 ml of exparel was injected bilaterally into the paraspinus muscles.  The fascial layer was reapproximated with the use of a 0- Vicryl suture.  Subcutaneous tissue layer was reapproximated using 2-0 Vicryl suture.  The skin was closed with 3-0 monocryl V-lock, then cleansed and Dermabond was used to close the skin opening.  Patient was then rotated back to the preoperative bed awakened from anesthesia and taken to recovery. all counts are correct in this case.  I performed the entire procedure.   Meade Maw MD  EBL: 350 ml IVF: 1500 ml

## 2016-10-29 DIAGNOSIS — M48062 Spinal stenosis, lumbar region with neurogenic claudication: Secondary | ICD-10-CM | POA: Diagnosis not present

## 2016-10-29 MED ORDER — GABAPENTIN 300 MG PO CAPS: 300.0000 mg | ORAL_CAPSULE | Freq: Three times a day (TID) | ORAL | 0 refills | Status: DC

## 2016-10-29 MED ORDER — HYDROCODONE-ACETAMINOPHEN 5-325 MG PO TABS: 1.0000 | ORAL_TABLET | ORAL | 0 refills | Status: DC | PRN

## 2016-10-29 MED ORDER — METHOCARBAMOL 500 MG PO TABS: 500.0000 mg | ORAL_TABLET | Freq: Four times a day (QID) | ORAL | 0 refills | Status: DC | PRN

## 2016-10-29 NOTE — Evaluation (Signed)
Physical Therapy Evaluation Patient Details Name: Richard Alexander MRN: [id number] DOB: 11-20-1941 Today's Date: 10/29/2016   History of Present Illness  admitted for acute hospitalization status post lumbar decompression, laminectomy, facetectomy and foraminotomy (L3-S1) 10/28/16.  Clinical Impression  Upon evaluation, patient alert and oriented to basic information; follows commands, but demonstrates noted difficulty with recall and comprehension/integration of back precautions.  Appears intermittent confused throughout session with difficulty accurately relaying social history, PLOF at times.  Rates back pain at 4/10; meds received prior to session.  Bilat LE strength and ROM grossly WFL; no sensory deficit reported per patient.  Currently requiring min assist for bed mobility, step by step cuing for log rolling technique; min/mod assist for sit/stand, basic transfers and short-distance gait (5') without assist device.  Demonstrates very broad BOS, staggered and unsteady steps with frequent posterior weight shift/LOB (constantly reaching/grabbing for furniture for external stabilization). Issued RW for additional mobility efforts.  Gait does improve to more consistent min assist, though still demonstrates balance deficits and overall instability; single episode of L LE buckling requiring min assist for correction. Did initiate stair assessment-unsafe/unable to complete without UE support (patient reports he is unable to utilize handrail at entry/exit of home) due to balance deficits/fall risk.  Requires constant min/mod assist from therapist, even with single rail (if able to access).  Constant cuing for technique and overall safety.  Transitioned to curb management with RW (patient reports having second entrance with single step to patio, second step to home); continues to require min assist for posterior LOB and constant instruction for technique.  Do recommend use of entrance with patio and will  require +1 assist for entry/exit of home upon discharge. Would benefit from skilled PT to address above deficits and promote optimal return to PLOF; recommend transition to STR upon discharge from acute hospitalization given above-noted mobility deficits and associated fall risk.  Will plan to see patient for additional visit this PM to further assess/educate.     Follow Up Recommendations SNF    Equipment Recommendations  Rolling walker with 5" wheels    Recommendations for Other Services       Precautions / Restrictions Precautions Precautions: Back Restrictions Weight Bearing Restrictions: No      Mobility  Bed Mobility Overal bed mobility: Needs Assistance Bed Mobility: Supine to Sit     Supine to sit: Min assist     General bed mobility comments: education for log rolling technique for adherence to back precautions  Transfers Overall transfer level: Needs assistance   Transfers: Sit to/from Stand Sit to Stand: Min assist         General transfer comment: broad BOS, requires UE support to initiate/complete; excessive posterior weight shift, min assist to prevent posterior LOB  Ambulation/Gait Ambulation/Gait assistance: Mod assist;Min assist Ambulation Distance (Feet): 5 Feet Assistive device: None       General Gait Details: broad BOS, very staggering and unsteady; unable to complete without UE support/assist (constant reaching/grabbing for furniture)  Stairs            Wheelchair Mobility    Modified Rankin (Stroke Patients Only)       Balance Overall balance assessment: Needs assistance Sitting-balance support: No upper extremity supported;Feet supported Sitting balance-Leahy Scale: Good     Standing balance support: No upper extremity supported Standing balance-Leahy Scale: Poor  Pertinent Vitals/Pain Pain Assessment: 0-10 Pain Score: 4  Pain Location: back Pain Descriptors / Indicators:  Aching;Grimacing;Guarding Pain Intervention(s): Limited activity within patient's tolerance;Monitored during session;Premedicated before session;Repositioned    Home Living Family/patient expects to be discharged to:: Private residence Living Arrangements: Spouse/significant other Available Help at Discharge: Family Type of Home: House Home Access: Stairs to enter Entrance Stairs-Rails: Left (reports he cannot hold railing while on steps; does have second entrance with 1 step onto porch, 1 step into home) Entrance Stairs-Number of Steps: 2 Home Layout: One level Home Equipment: None      Prior Function Level of Independence: Independent         Comments: Indep with ADLs, household and community mobility; 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, newspaper        Extremity/Trunk Assessment   Upper Extremity Assessment: Overall WFL for tasks assessed           Lower Extremity Assessment: Generalized weakness (grossly 5/5 throughout; denies paresthesia)         Communication   Communication: No difficulties  Cognition Arousal/Alertness: Awake/alert Behavior During Therapy: WFL for tasks assessed/performed Overall Cognitive Status: Difficult to assess (intermittent confusion-difficult relaying accurate information about home environment; poor recall and integration of back precautions)                      General Comments      Exercises Other Exercises Other Exercises: 200' with RW, min assist--improved gait fluidity, but still with inconsistent step height/length; excessive lateral sway and poor dynamic balance abilities.  Frequent cuing for walker position.  Single episode of L LE buckling requiring assist from therapist to recover.  Do recommend use of RW and +1 at all times Other Exercises: Curb management with RW, min assist from therapist; constant instruction for sequencing/technique and walker position/management.  Posterior LOB x1  requiring min assist for recovery.  Recommend use of entry/exit of home with patio vs sequential steps (no railings available)   Assessment/Plan    PT Assessment Patient needs continued PT services  PT Problem List Decreased strength;Decreased range of motion;Decreased activity tolerance;Decreased balance;Decreased mobility;Decreased cognition;Decreased knowledge of use of DME;Decreased safety awareness;Decreased knowledge of precautions;Obesity;Pain          PT Treatment Interventions DME instruction;Gait training;Stair training;Functional mobility training;Therapeutic activities;Therapeutic exercise;Balance training;Patient/family education    PT Goals (Current goals can be found in the Care Plan section)  Acute Rehab PT Goals Patient Stated Goal: to get back home PT Goal Formulation: With patient Time For Goal Achievement: 11/12/16 Potential to Achieve Goals: Good Additional Goals Additional Goal #1: Indep recall and adherence to back precautions with all functional activities.    Frequency 7X/week   Barriers to discharge Decreased caregiver support      Co-evaluation               End of Session Equipment Utilized During Treatment: Gait belt Activity Tolerance: Patient limited by pain Patient left: in chair;with call bell/phone within reach Nurse Communication: Mobility status    Functional Assessment Tool Used: clinical judgement Functional Limitation: Mobility: Walking and moving around Mobility: Walking and Moving Around Current Status [id number]): At least 20 percent but less than 40 percent impaired, limited or restricted Mobility: Walking and Moving Around Goal Status [phone]): At least 1 percent but less than 20 percent impaired, limited or restricted    Time: 1032-1058 PT Time Calculation (min) (ACUTE ONLY): 26 min   Charges:  PT Evaluation $PT Eval Low Complexity: 1 Procedure PT Treatments $Gait Training: 8-22 mins   PT G Codes:   PT G-Codes **NOT FOR  INPATIENT CLASS** Functional Assessment Tool Used: clinical judgement Functional Limitation: Mobility: Walking and moving around Mobility: Walking and Moving Around Current Status [id number]): At least 20 percent but less than 40 percent impaired, limited or restricted Mobility: Walking and Moving Around Goal Status [phone]): At least 1 percent but less than 20 percent impaired, limited or restricted    Tanya Marvin H. Owens Shark, PT, DPT, NCS 10/29/16, 11:56 AM [phone]

## 2016-10-29 NOTE — Progress Notes (Signed)
Notified Dr Izora Ribas regarding pt's  Low blood pressure. No new orders received

## 2016-10-29 NOTE — Progress Notes (Signed)
Shift assessment completed. Pt resting in bed, alert and oriented. Lungs are clear bilat but decreased to l base. HR is regular, abdomen is soft, bs heard. Pt denied difficulty voiding. PPP, no edema noted. Pt has small dressing to lower back intact and dry.

## 2016-10-29 NOTE — Progress Notes (Signed)
Physical Therapy Treatment Patient Details Name: Richard Alexander MRN: [id number] DOB: 29-Jun-1941 Today's Date: 10/29/2016    History of Present Illness Pt. is a 75 y.o. male who was admitted to [facility] for a Lumbar Decompression, Laminectomy, and foraminotomy (L3-S1)    PT Comments    Improved mental clarity and processing this PM.  Improving stability with mobility efforts, but does still require use of RW and +1 with all mobility (intermittent posterior LOB with initial transition to upright, turns).   Frequent cuing for integration/adherence of back precautions; frequent cuing for safety awareness/insight. Patient agreeable to using front entrance of home with patio/landing to allow for use of RW.   Follow Up Recommendations  Home health PT     Equipment Recommendations  Rolling walker with 5" wheels    Recommendations for Other Services       Precautions / Restrictions Precautions Precautions: Back Restrictions Weight Bearing Restrictions: No    Mobility  Bed Mobility               General bed mobility comments: seated in recliner beginning/end of session  Transfers Overall transfer level: Needs assistance Equipment used: Rolling walker (2 wheeled) Transfers: Sit to/from Stand Sit to Stand: Min guard;Supervision         General transfer comment: cuing for hand placement to prevent pulling on RW; broad BOS, increased use of momentum, UE assist for full anterior weight translation  Ambulation/Gait Ambulation/Gait assistance: Min guard;Min assist Ambulation Distance (Feet): 200 Feet Assistive device: Rolling walker (2 wheeled)       General Gait Details: difficulty maintaining body position within BOS of RW, especially with turns.  Generally unsteady with turns, obstacle negotiation and surface changes.   Stairs Stairs: Yes Stairs assistance: Min assist Stair Management: With walker Number of Stairs: 1 (x2) General stair comments: limited carry-over  of technique; somewhat impulsive with decreased functional adherence to back precautions.  Patient agreeable to using entrance of home with patio and single step to allow for use of RW  Did experience single L LE buckling when attempting to ascend step with L LE; min assist for recovery.  Wheelchair Mobility    Modified Rankin (Stroke Patients Only)       Balance Overall balance assessment: Needs assistance Sitting-balance support: No upper extremity supported;Feet supported Sitting balance-Leahy Scale: Good     Standing balance support: Bilateral upper extremity supported Standing balance-Leahy Scale: Fair                      Cognition Arousal/Alertness: Awake/alert Behavior During Therapy: WFL for tasks assessed/performed Overall Cognitive Status: Within Functional Limits for tasks assessed (improved clarity in PM)                      Exercises Other Exercises Other Exercises: Upper and lower body dressing, min assist for adherence to back precautions (to avoid bending) while threading LEs and donning shoes.  sit/stand with and without assist device throughout dressing routine, close sup; heavy use of bilat LEs against seating surface for external stabilization with each rep    General Comments        Pertinent Vitals/Pain Pain Assessment: 0-10 Pain Score: 4  Pain Location: back Pain Descriptors / Indicators: Aching;Grimacing Pain Intervention(s): Limited activity within patient's tolerance;Monitored during session;Premedicated before session;Repositioned    Home Living Family/patient expects to be discharged to:: Private residence Living Arrangements: Spouse/significant other Available Help at Discharge: Family Type of Home: House Home Access:  Stairs to enter Entrance Stairs-Rails: Left Home Layout: One level Home Equipment: None      Prior Function Level of Independence: Independent      Comments: Pt. 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   PT Goals (current goals can now be found in the care plan section) Acute Rehab PT Goals Patient Stated Goal: To regain independence PT Goal Formulation: With patient Time For Goal Achievement: 11/12/16 Potential to Achieve Goals: Good Additional Goals Additional Goal #1: Indep recall and adherence to back precautions with all functional activities. Progress towards PT goals: Progressing toward goals    Frequency    7X/week      PT Plan Discharge plan needs to be updated    Co-evaluation             End of Session Equipment Utilized During Treatment: Gait belt Activity Tolerance: Patient tolerated treatment well Patient left: in chair;with call bell/phone within reach     Time: [id number] PT Time Calculation (min) (ACUTE ONLY): 24 min  Charges:  $Gait Training: 8-22 mins $Therapeutic Activity: 8-22 mins                    G Codes:      Mariaguadalupe Fialkowski H. Owens Shark, PT, DPT, NCS 10/29/16, 4:20 PM [phone]

## 2016-10-29 NOTE — Evaluation (Signed)
Occupational Therapy Evaluation Patient Details Name: Richard Alexander MRN: [id number] DOB: 14-Feb-1941 Today's Date: 10/29/2016    History of Present Illness Pt. is a 75 y.o. male who was admitted to [facility] for a Lumbar Decompression, Laminectomy, and foraminotomy (L3-S1)   Clinical Impression   Pt. Is a 75 y.o. Male who was admitted to [facility] for a Lumbar Decompression, Laminectomy, and foraminotomy (L3-S1) Pt. was orthostatic upon standing. BP: 112/64 in sitting, 92/54 in standing. Pt. presents with pain, limited ROM, decreased functional mobility, and weakness which hinder his ability to complete ADL and IADL functioning. Pt. could benefit from skilled OT services for ADL training, A/E training, and pt. Education about home modification, and DME. Pt. Is appropriate for SNF with follow-up OT services.    Follow Up Recommendations  SNF    Equipment Recommendations       Recommendations for Other Services       Precautions / Restrictions Precautions Precautions: Back Restrictions Weight Bearing Restrictions: No              ADL Overall ADL's : Needs assistance/impaired Eating/Feeding: Set up   Grooming: Set up;Sitting           Upper Body Dressing : Maximal assistance                   Functional mobility during ADLs: Minimal assistance General ADL Comments: Pt. education was provided about A/E use for LE ADLs.      Vision     Perception     Praxis      Pertinent Vitals/Pain Pain Assessment: 0-10 Pain Score: 4  Pain Location: back Pain Descriptors / Indicators: Aching;Grimacing;Guarding Pain Intervention(s): Limited activity within patient's tolerance;Monitored during session;Premedicated before session;Repositioned     Hand Dominance Right   Extremity/Trunk Assessment Upper Extremity Assessment Upper Extremity Assessment: Overall WFL for tasks assessed     Communication Communication Communication: No difficulties   Cognition  Arousal/Alertness: Awake/alert Behavior During Therapy: WFL for tasks assessed/performed Overall Cognitive Status: Difficult to assess                     General Comments       Exercises     Shoulder Instructions      Home Living Family/patient expects to be discharged to:: Private residence Living Arrangements: Spouse/significant other Available Help at Discharge: Family Type of Home: House Home Access: Stairs to enter Technical brewer of Steps: 2 Entrance Stairs-Rails: Left Home Layout: One level               Home Equipment: None          Prior Functioning/Environment Level of Independence: Independent        Comments: Pt. 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        OT Problem List: Decreased strength;Pain;Decreased activity tolerance;Decreased knowledge of use of DME or AE   OT Treatment/Interventions: Self-care/ADL training;Therapeutic exercise;DME and/or AE instruction;Patient/family education;Therapeutic activities    OT Goals(Current goals can be found in the care plan section) Acute Rehab OT Goals Patient Stated Goal: To regain independence OT Goal Formulation: With patient Potential to Achieve Goals: Good  OT Frequency: Min 1X/week   Barriers to D/C:            Co-evaluation              End of Session Equipment Utilized During Treatment: Gait  belt  Activity Tolerance: Patient tolerated treatment well Patient left: in chair;with call bell/phone within reach   Time: 1310-1340 OT Time Calculation (min): 30 min Charges:  OT General Charges $OT Visit: 1 Procedure OT Evaluation $OT Eval Moderate Complexity: 1 Procedure OT Treatments $Self Care/Home Management : 8-22 mins G-Codes: OT G-codes **NOT FOR INPATIENT CLASS** Functional Assessment Tool Used: CLinical judgement based on pt. current functional level Functional Limitation:  Self care Self Care Current Status [id number]): At least 20 percent but less than 40 percent impaired, limited or restricted Self Care Goal Status [id number]): 0 percent impaired, limited or restricted  Harrel Carina, MS, OTR/L 10/29/2016, 2:17 PM

## 2016-10-29 NOTE — Progress Notes (Signed)
    Attending Progress Note  History: Richard Alexander is here for lumbar stenosis and neurogenic claudication. He is doing well. He has some back discomfort, but no leg pain.  Physical Exam: Vitals:   10/29/16 0617 10/29/16 0735  BP: (!) 99/58 102/60  Pulse:  62  Resp:  18  Temp:      AA Ox3 CNI  Strength:5/5 throughout BLE Incision c/d/i  Data:  No results for input(s): NA, K, CL, CO2, BUN, CREATININE, LABGLOM, GLUCOSE, CALCIUM in the last 168 hours. No results for input(s): AST, ALT, ALKPHOS in the last 168 hours.  Invalid input(s): TBILI   No results for input(s): WBC, HGB, HCT, PLT in the last 168 hours. No results for input(s): APTT, INR in the last 168 hours.       Other tests/results: none  Assessment/Plan:  Richard Alexander is POD1 from lumbar decompression. Plan for mobilization today and discharge home.  Meade Maw MD, [facility] Department of Neurosurgery

## 2016-10-29 NOTE — Discharge Summary (Signed)
Physician Discharge Summary  Patient ID: Richard Alexander MRN: [id number] DOB/AGE: [birth year] 74 y.o.  Admit date: 10/28/2016 Discharge date: 10/29/2016  Admission Diagnoses: Lumbar stenosis with neurogenic claudication  Discharge Diagnoses:  Active Problems:   Lumbar stenosis with neurogenic claudication   Discharged Condition: good  Hospital Course: [name] was admitted for lumbar decompression. He tolerated the procedure well and was stable for discharge on POD1 with stable neurological exam and pain under control with PO medications.  Consults: None  Significant Diagnostic Studies: none  Treatments: surgery: L3-S1 lumbar decompression including laminectomies and bilateral facetectomies and foraminotomies  Discharge Exam: Blood pressure 102/60, pulse 62, temperature 98.4 F (36.9 C), temperature source Oral, resp. rate 18, height 5\' 5"  (1.651 m), weight 98.9 kg (218 lb), SpO2 96 %. AAOx3. CNI. 5/5 BLE. Incision c/d/i  Disposition:      Medication List    STOP taking these medications   acetaminophen 500 MG tablet Commonly known as:  TYLENOL     TAKE these medications   aspirin EC 81 MG tablet Take 81 mg by mouth daily.   finasteride 5 MG tablet Commonly known as:  PROSCAR Take 1 tablet (5 mg total) by mouth daily.   gabapentin 300 MG capsule Commonly known as:  NEURONTIN Take 1 capsule (300 mg total) by mouth 3 (three) times daily. What changed:  medication strength  how much to take   hydrochlorothiazide 25 MG tablet Commonly known as:  HYDRODIURIL Take 25 mg by mouth daily.   HYDROcodone-acetaminophen 5-325 MG tablet Commonly known as:  NORCO/VICODIN Take 1-2 tablets by mouth every 4 (four) hours as needed (mild pain).   methocarbamol 500 MG tablet Commonly known as:  ROBAXIN Take 1 tablet (500 mg total) by mouth every 6 (six) hours as needed for muscle spasms.   polyethylene glycol packet Commonly known as:  MIRALAX / GLYCOLAX Take 17 g by  mouth daily as needed.   potassium chloride SA 20 MEQ tablet Commonly known as:  K-DUR,KLOR-CON Take 20 mEq by mouth daily.   pravastatin 20 MG tablet Commonly known as:  PRAVACHOL Take 20 mg by mouth daily.   tamsulosin 0.4 MG Caps capsule Commonly known as:  FLOMAX Take 0.4 mg by mouth daily.      Follow-up Information    Meade Maw, MD Follow up on 12/03/2016.   Specialty:  Neurosurgery Why:  at 2:45 pm Contact information: [place] [address] [phone]           Signed: Meade Maw 10/29/2016, 7:50 AM

## 2016-10-29 NOTE — Clinical Social Work Note (Signed)
Clinical Social Work Assessment  Patient Details  Name: Richard Alexander MRN: [id number] Date of Birth: 1941/11/17  Date of referral:  10/29/16               Reason for consult:  Facility Placement, Insurance Barriers                Permission sought to share information with:  Case Manager Permission granted to share information::  Yes, Verbal Permission Granted  Name::        Agency::     Relationship::     Contact Information:     Housing/Transportation Living arrangements for the past 2 months:  Single Family Home Source of Information:  Patient Patient Interpreter Needed:  None Criminal Activity/Legal Involvement Pertinent to Current Situation/Hospitalization:  No - Comment as needed Significant Relationships:  Adult Children, Spouse Lives with:  Spouse Do you feel safe going back to the place where you live?  Yes Need for family participation in patient care:  Yes (Comment)  Care giving concerns:  Patient lives in [place] with his wife Richard Alexander who is a hospice patient.    Social Worker assessment / plan:  Holiday representative (CSW) received verbal consult from PT that recommendation is SNF. Per PT patient ambulated with a walker 200 feet around the nurses station and did stairs. Per PT patient did not do well and was losing his balance. Patient's primary insurance is BCBS, patient is still working for the school system. Patient is going to be discharged today per RN in progression rounds. CSW discussed case with Engineer, agricultural. BCBS will not cover SNF based on patient's PT note and current needs. CSW director reported that an LOG was not appropriate at this time. The options for patient are to go home with home health or pay privately for SNF.   CSW met with patient to discuss D/C plan. Patient was alert and oriented and sitting up in the chair eating lunch. Patient reported that he still works for the school system and lives in [place] with his wife. Patient reported that he usually  takes care of his wife but she also has a caregiver that comes in a few hours per day. CSW explained that patient can pay out of pocket for SNF or go home with home health. Patient reported that he could not pay privately for SNF. Patient reported that he also has Medicare. CSW explained that BCBS is his primary insurance because he is still working and that is what the SNF would have to bill. CSW also explained that Medicare would not cover SNF this time because he is under observation and would require a 3 night qualifying inpatient stay at a hospital in order for Medicare to pay for SNF. Patient verbalized his understanding. Plan is for patient to go home with home health. RN case manager is aware of above. Please reconsult if future social work needs arise. CSW signing off.    Employment status:  Therapist, music:  Managed Care PT Recommendations:  [place] / Referral to community resources:  Other (Comment Required) (Patient will D/C home with home health. )  Patient/Family's Response to care:  Patient is agreeable to going home and reported that he could not pay out of pocket for SNF.   Patient/Family's Understanding of and Emotional Response to Diagnosis, Current Treatment, and Prognosis:  Patient was pleasant and thanked CSW for assistance.   Emotional Assessment Appearance:  Appears stated age Attitude/Demeanor/Rapport:  Affect (typically observed):  Accepting, Adaptable, Pleasant Orientation:  Oriented to Self, Oriented to Place, Oriented to  Time, Oriented to Situation Alcohol / Substance use:  Not Applicable Psych involvement (Current and /or in the community):  No (Comment)  Discharge Needs  Concerns to be addressed:  Discharge Planning Concerns, No discharge needs identified Readmission within the last 30 days:  No Current discharge risk:  None Barriers to Discharge:  No Barriers Identified   Richard Alexander, Richard Beets, LCSW 10/29/2016, 3:01  PM

## 2016-10-29 NOTE — Discharge Instructions (Signed)
°Your surgeon has performed an operation on your lumbar spine (low back) to relieve pressure on one or more nerves. Many times, patients feel better immediately after surgery and can “overdo it.” Even if you feel well, it is important that you follow these activity guidelines. If you do not let your back heal properly from the surgery, you can increase the chance of a disc herniation and return of your symptoms. The following are instructions to help in your recovery once you have been discharged from the hospital. ° °* Do not take anti-inflammatory medications for 5 days after surgery (naproxen [Aleve], ibuprofen [Advil, Motrin], celecoxib [Celebrex], etc.) ° °Activity  °  °No bending, lifting, or twisting (“BLT”). Avoid lifting objects heavier than 10 pounds (gallon milk jug).  Where possible, avoid household activities that involve lifting, bending, pushing, or pulling such as laundry, vacuuming, grocery shopping, and childcare. Try to arrange for help from friends and family for these activities while your back heals. ° °Increase physical activity slowly as tolerated.  Taking short walks is encouraged, but avoid strenuous exercise. Do not jog, run, bicycle, lift weights, or participate in any other exercises unless specifically allowed by your doctor. Avoid prolonged sitting, including car rides. ° °Talk to your doctor before resuming sexual activity. ° °You should not drive until cleared by your doctor. ° °Until released by your doctor, you should not return to work or school.  You should rest at home and let your body heal.  ° °You may shower three days after your surgery.  After showering, lightly dab your incision dry. Do not take a tub bath or go swimming until approved by your doctor at your follow-up appointment. ° °If you smoke, we strongly recommend that you quit.  Smoking has been proven to interfere with normal healing in your back and will dramatically reduce the success rate of your surgery. Please  contact [facility] (800-QUIT-NOW) and use the resources at www.[facility].com for assistance in stopping smoking. ° °Surgical Incision °  °If you have a dressing on your incision, you may remove it two days after your surgery. Keep your incision area clean and dry. ° °If you have staples or stitches on your incision, you should have a follow up scheduled for removal. If you do not have staples or stitches, you will have steri-strips (small pieces of surgical tape) or Dermabond glue. The steri-strips/glue should begin to peel away within about a week (it is fine if the steri-strips fall off before then). If the strips are still in place one week after your surgery, you may gently remove them. ° °Diet          ° ° You may return to your usual diet. Be sure to stay hydrated. ° °When to Contact Us ° °Although your surgery and recovery will likely be uneventful, you may have some residual numbness, aches, and pains in your back and/or legs. This is normal and should improve in the next few weeks. ° °However, should you experience any of the following, contact us immediately: °• New numbness or weakness °• Pain that is progressively getting worse, and is not relieved by your pain medications or rest °• Bleeding, redness, swelling, pain, or drainage from surgical incision °• Chills or flu-like symptoms °• Fever greater than 101.0 F (38.3 C) °• Problems with bowel or bladder functions °• Difficulty breathing or shortness of breath °• Warmth, tenderness, or swelling in your calf ° °Contact Information °• During office hours (Monday-Friday 9 am to 5   pm), please call your physician at [phone] °• After hours and weekends, please call the [facility] Operator at [phone] and ask for the Neurosurgery Resident On Call  °• For a life-threatening emergency, call 911 ° °

## 2016-10-29 NOTE — Progress Notes (Signed)
Pt has been dc'd home. Prior to d/c, this writer removed PIVs from pt's rac and his rfa with catheters intact, pt tolerated well. Pt received scripts for robaxin, oxycodone, and neurontin, and d/c instructions were briefly reviewed. Pt in no distress, continued to c/o soreness to incision site, but able to dress himself and visit with people at bedside.

## 2016-10-29 NOTE — Care Management Note (Signed)
Case Management Note  Patient Details  Name: Richard Alexander MRN: [id number] Date of Birth: June 14, 1941  Subjective/Objective:    Met with patient at bedside to discuss discharge plan. Patient is sitting up in chair. He is alert, oriented and cooperative. No confusion or memory issues noted during our conversation. Patient states he lives at home with his wife who is a hospice patient. They have a hired Actuary from [place] to 2p each day. Son has been staying at night with the her.  Discussed home health PT with patient. He c/o weakness and some pain at surgical site. He is open to home health PT with no agency preference. [facility]erral to [facility]. Patient has a walker and cane at home if needed. PCP is Dr. Dorthula Perfect.              Action/Plan: Home Health PT with [facility].   Expected Discharge Date:                  Expected Discharge Plan:  [place]  In-House Referral:  Clinical Social Work  Discharge planning Services  CM Consult  Post Acute Care Choice:  Home Health Choice offered to:  Patient  DME Arranged:    DME Agency:     HH Arranged:  PT [place]:  [facility]  Status of Service:  Completed, signed off  If discussed at [place] of Stay Meetings, dates discussed:    Additional Comments:  Jolly Mango, RN 10/29/2016, 2:45 PM

## 2016-11-02 ENCOUNTER — Encounter: Payer: Self-pay | Admitting: Neurosurgery

## 2016-11-23 DIAGNOSIS — N400 Enlarged prostate without lower urinary tract symptoms: Secondary | ICD-10-CM | POA: Insufficient documentation

## 2016-11-23 DIAGNOSIS — R972 Elevated prostate specific antigen [PSA]: Secondary | ICD-10-CM

## 2017-01-14 ENCOUNTER — Other Ambulatory Visit: Payer: BC Managed Care – PPO

## 2017-01-14 DIAGNOSIS — R972 Elevated prostate specific antigen [PSA]: Secondary | ICD-10-CM | POA: Diagnosis not present

## 2017-01-15 LAB — PSA: PROSTATE SPECIFIC AG, SERUM: 2.6 ng/mL (ref 0.0–4.0)

## 2017-01-20 ENCOUNTER — Ambulatory Visit (INDEPENDENT_AMBULATORY_CARE_PROVIDER_SITE_OTHER): Payer: BC Managed Care – PPO | Admitting: Urology

## 2017-01-20 ENCOUNTER — Encounter: Payer: Self-pay | Admitting: Urology

## 2017-01-20 VITALS — BP 122/72 | HR 86 | Ht 63.0 in | Wt 214.1 lb

## 2017-01-20 DIAGNOSIS — N401 Enlarged prostate with lower urinary tract symptoms: Secondary | ICD-10-CM | POA: Diagnosis not present

## 2017-01-20 DIAGNOSIS — N138 Other obstructive and reflux uropathy: Secondary | ICD-10-CM | POA: Diagnosis not present

## 2017-01-20 DIAGNOSIS — Z87898 Personal history of other specified conditions: Secondary | ICD-10-CM | POA: Diagnosis not present

## 2017-01-20 LAB — BLADDER SCAN AMB NON-IMAGING: Scan Result: 0

## 2017-01-20 MED ORDER — FINASTERIDE 5 MG PO TABS: 5.0000 mg | ORAL_TABLET | Freq: Every day | ORAL | 3 refills | Status: DC

## 2017-01-20 MED ORDER — TAMSULOSIN HCL 0.4 MG PO CAPS: 0.4000 mg | ORAL_CAPSULE | Freq: Every day | ORAL | 3 refills | Status: DC

## 2017-01-20 NOTE — Progress Notes (Signed)
01/20/2017 11:12 AM   Richard Alexander 01-26-1941 [id number]  Referring provider: Ezequiel Kayser, MD [facility] [place], [place] [address]  Chief Complaint  Patient presents with  . Benign Prostatic Hypertrophy    3 month follow up   . Elevated PSA    HPI: Patient is a 76 year old African American male who presents today for a three month follow up for an elevated PSA, BPH with LU TS and ED who was placed on finasteride 5 mg daily.  Background history Patient was referred by Dr. Raechel Ache.  Patient was found to have a PSA of 7.12 ng/mL on 09/25/2016 during a follow up with his PCP.  He was complaining of daytime frequency and mild dribbling.  He also has nocturia x 1-3.  Patient has sleep apnea.  He was initiated on tamsulosin 0.4 mg daily.  His IPSS score was 8, which is moderate lower urinary tract symptomatology.  He was mostly satisfied with his quality life due to his urinary symptoms.  His major complaint today daytime frequency and urgency.  He has had these symptoms for over the last year.  He denies any dysuria, hematuria or suprapubic pain.   No previous PSA on record.  He also denies any recent fevers, chills, nausea or vomiting.  He does not have a family history of PCa.    He was started on finasteride 5 mg daily three months ago.  His I PSS score today is 9, which is moderate LU TS.  He is mixed with his quality of life due to his urinary symptoms.  His complaint today is daytime frequency and nocturia.  He denies any dysuria, hematuria or suprapubic pain.  He also denies any recent fevers, chills, nausea or vomiting.  His recent PSA was 2.6 ng/mL on 01/14/2017.        IPSS    Row Name 01/20/17 1000         International Prostate Symptom Score   How often have you had the sensation of not emptying your bladder? Less than 1 in 5     How often have you had to urinate less than every two hours? More than half the time     How often have you found  you stopped and started again several times when you urinated? Not at All     How often have you found it difficult to postpone urination? About half the time     How often have you had a weak urinary stream? Not at All     How often have you had to strain to start urination? Not at All     How many times did you typically get up at night to urinate? 1 Time     Total IPSS Score 9       Quality of Life due to urinary symptoms   If you were to spend the rest of your life with your urinary condition just the way it is now how would you feel about that? Mixed        Score:  1-7 Mild 8-19 Moderate 20-35 Severe   PMH: Past Medical History:  Diagnosis Date  . Hypertension   . Presence of permanent cardiac pacemaker     Surgical History: Past Surgical History:  Procedure Laterality Date  . ANTERIOR CERVICAL DISCECTOMY  ?  . HAMMER TOE SURGERY Right 2007   2nd toe  . LUMBAR LAMINECTOMY/DECOMPRESSION MICRODISCECTOMY Bilateral 10/28/2016   Procedure:  LUMBAR LAMINECTOMY/DECOMPRESSION MICRODISCECTOMY 3 LEVELS;  Surgeon: Meade Maw, MD;  Location: [facility] ORS;  Service: Neurosurgery;  Laterality: Bilateral;  L3-4, L4-5,L5-S1 Laminectomies and bilateral foraminotomies  . PACEMAKER INSERTION  2011    Home Medications:  Allergies as of 01/20/2017   No Known Allergies     Medication List       Accurate as of 01/20/17 11:12 AM. Always use your most recent med list.          aspirin EC 81 MG tablet Take 81 mg by mouth daily.   finasteride 5 MG tablet Commonly known as:  PROSCAR Take 1 tablet (5 mg total) by mouth daily.   gabapentin 300 MG capsule Commonly known as:  NEURONTIN Take 1 capsule (300 mg total) by mouth 3 (three) times daily.   hydrochlorothiazide 25 MG tablet Commonly known as:  HYDRODIURIL Take 25 mg by mouth daily.   HYDROcodone-acetaminophen 5-325 MG tablet Commonly known as:  NORCO/VICODIN Take 1-2 tablets by mouth every 4 (four) hours as needed (mild  pain).   methocarbamol 500 MG tablet Commonly known as:  ROBAXIN Take 1 tablet (500 mg total) by mouth every 6 (six) hours as needed for muscle spasms.   polyethylene glycol packet Commonly known as:  MIRALAX / GLYCOLAX Take 17 g by mouth daily as needed.   potassium chloride SA 20 MEQ tablet Commonly known as:  K-DUR,KLOR-CON Take 20 mEq by mouth daily.   pravastatin 20 MG tablet Commonly known as:  PRAVACHOL Take 20 mg by mouth daily.   tamsulosin 0.4 MG Caps capsule Commonly known as:  FLOMAX Take 1 capsule (0.4 mg total) by mouth daily.       Allergies: No Known Allergies  Family History: Family History  Problem Relation Age of Onset  . Hypertension Father   . Stroke Father   . Aortic aneurysm Mother   . Prostate cancer Neg Hx   . Kidney cancer Neg Hx   . Bladder Cancer Neg Hx     Social History:  reports that he quit smoking about 41 years ago. His smoking use included Cigarettes. He has never used smokeless tobacco. He reports that he does not drink alcohol or use drugs.  ROS: UROLOGY Frequent Urination?: Yes Hard to postpone urination?: No Burning/pain with urination?: No Get up at night to urinate?: Yes Leakage of urine?: No Urine stream starts and stops?: No Trouble starting stream?: No Do you have to strain to urinate?: No Blood in urine?: No Urinary tract infection?: No Sexually transmitted disease?: No Injury to kidneys or bladder?: No Painful intercourse?: No Weak stream?: No Erection problems?: No Penile pain?: No  Gastrointestinal Nausea?: No Vomiting?: No Indigestion/heartburn?: No Diarrhea?: No Constipation?: No  Constitutional Fever: No Night sweats?: No Weight loss?: No Fatigue?: No  Skin Skin rash/lesions?: No Itching?: No  Eyes Blurred vision?: No Double vision?: No  Ears/Nose/Throat Sore throat?: No Sinus problems?: No  Hematologic/Lymphatic Swollen glands?: No Easy bruising?: No  Cardiovascular Leg  swelling?: No Chest pain?: No  Respiratory Cough?: No Shortness of breath?: No  Endocrine Excessive thirst?: No  Musculoskeletal Back pain?: No Joint pain?: No  Neurological Headaches?: No Dizziness?: No  Psychologic Depression?: No Anxiety?: No  Physical Exam: BP 122/72   Pulse 86   Ht 5\' 3"  (1.6 m)   Wt 214 lb 1.6 oz (97.1 kg)   BMI 37.93 kg/m   Constitutional: Well nourished. Alert and oriented, No acute distress. HEENT: [place] AT, moist mucus membranes. Trachea midline, no masses.  Cardiovascular: No clubbing, cyanosis, or edema. Respiratory: Normal respiratory effort, no increased work of breathing. Skin: No rashes, bruises or suspicious lesions. Lymph: No cervical or inguinal adenopathy. Neurologic: Grossly intact, no focal deficits, moving all 4 extremities. Psychiatric: Normal mood and affect.  Laboratory Data: PSA History  7.12 ng/mL on 09/25/2016  2.6 ng/mL on 01/14/2017  Lab Results  Component Value Date   WBC 9.8 10/19/2016   HGB 14.8 10/19/2016   HCT 42.8 10/19/2016   MCV 93.2 10/19/2016   PLT 274 10/19/2016    Lab Results  Component Value Date   CREATININE 0.89 10/19/2016    Lab Results  Component Value Date   AST 24 10/19/2016   Lab Results  Component Value Date   ALT 22 10/19/2016     Assessment & Plan:    1. History of elevated PSA  - PSA has reduced to 2.6  - continue finasteride  2. BPH with LUTS  - IPSS score is 9/3, it is worsening  - Continue conservative management, avoiding bladder irritants and timed voiding's  - Continue tamsulosin 0.4 mg daily and finasteride 5 mg daily: refills given  - RTC in 6 months for IPSS, PSA, PVR and exam   3. Erectile dysfunction  - Not addressed at this visit  Return in about 6 months (around 07/20/2017) for IPSS, SHIM and exam.  These notes generated with voice recognition software. I apologize for typographical errors.  Zara Council, [facility] [address] [place], [place] [address] [phone]

## 2017-01-20 NOTE — Progress Notes (Signed)
1 

## 2017-02-02 ENCOUNTER — Other Ambulatory Visit: Payer: Self-pay

## 2017-02-02 DIAGNOSIS — N401 Enlarged prostate with lower urinary tract symptoms: Secondary | ICD-10-CM

## 2017-02-02 MED ORDER — FINASTERIDE 5 MG PO TABS: 5.0000 mg | ORAL_TABLET | Freq: Every day | ORAL | 3 refills | Status: DC

## 2017-06-21 DIAGNOSIS — E669 Obesity, unspecified: Secondary | ICD-10-CM | POA: Insufficient documentation

## 2017-06-29 ENCOUNTER — Other Ambulatory Visit: Payer: Self-pay | Admitting: Orthopedic Surgery

## 2017-06-29 DIAGNOSIS — G8929 Other chronic pain: Secondary | ICD-10-CM

## 2017-06-29 DIAGNOSIS — M5440 Lumbago with sciatica, unspecified side: Principal | ICD-10-CM

## 2017-07-02 ENCOUNTER — Ambulatory Visit
Admission: RE | Admit: 2017-07-02 | Discharge: 2017-07-02 | Disposition: A | Discharge status: Home / Self Care | Payer: Medicare Other | Source: Ambulatory Visit | Attending: Orthopedic Surgery | Admitting: Orthopedic Surgery

## 2017-07-02 DIAGNOSIS — M5440 Lumbago with sciatica, unspecified side: Secondary | ICD-10-CM | POA: Diagnosis present

## 2017-07-02 DIAGNOSIS — Z9889 Other specified postprocedural states: Secondary | ICD-10-CM | POA: Insufficient documentation

## 2017-07-02 DIAGNOSIS — M48061 Spinal stenosis, lumbar region without neurogenic claudication: Secondary | ICD-10-CM | POA: Diagnosis not present

## 2017-07-02 DIAGNOSIS — M47816 Spondylosis without myelopathy or radiculopathy, lumbar region: Secondary | ICD-10-CM | POA: Insufficient documentation

## 2017-07-02 DIAGNOSIS — G8929 Other chronic pain: Secondary | ICD-10-CM | POA: Diagnosis not present

## 2017-07-02 DIAGNOSIS — M4317 Spondylolisthesis, lumbosacral region: Secondary | ICD-10-CM | POA: Diagnosis not present

## 2017-07-02 DIAGNOSIS — M2578 Osteophyte, vertebrae: Secondary | ICD-10-CM | POA: Diagnosis not present

## 2017-07-19 ENCOUNTER — Other Ambulatory Visit: Payer: Medicare Other

## 2017-07-19 DIAGNOSIS — R972 Elevated prostate specific antigen [PSA]: Secondary | ICD-10-CM

## 2017-07-20 LAB — PSA: Prostate Specific Ag, Serum: 3.3 ng/mL (ref 0.0–4.0)

## 2017-07-20 NOTE — Progress Notes (Signed)
07/21/2017 11:05 AM   Richard Alexander Fila March 15, 1941 [id number]  Referring provider: Ezequiel Kayser, MD [facility] [place], [place] [address]  Chief Complaint  Patient presents with  . Benign Prostatic Hypertrophy    6 month follow up    HPI: Patient is a 76 year old African American male who presents today for a six months follow up for an elevated PSA, BPH with LU TS and ED who was placed on finasteride 5 mg daily.  History of elevated PSA Patient was referred by Dr. Raechel Ache.  Patient was found to have a PSA of 7.12 ng/mL on 09/25/2016 during a follow up with his PCP.   His current PSA is 3.3 ng/mL on 07/19/2017.    BPH WITH LUTS  (prostate and/or bladder) His IPSS score today is 8, which is moderate lower urinary tract symptomatology. He is pleased with his quality life due to his urinary symptoms.  His previous IPSS score was 9/3.  His major complaint today frequency, urgency and nocturia x 2.  He has had these symptoms for several years.  He denies any dysuria, hematuria or suprapubic pain.  He currently taking tamsulosin and finasteride.  He also denies any recent fevers, chills, nausea or vomiting.  He does not have a family history of PCa.      IPSS    Row Name 07/21/17 1000         International Prostate Symptom Score   How often have you had the sensation of not emptying your bladder? Less than 1 in 5     How often have you had to urinate less than every two hours? About half the time     How often have you found you stopped and started again several times when you urinated? Not at All     How often have you found it difficult to postpone urination? Less than half the time     How often have you had a weak urinary stream? Not at All     How often have you had to strain to start urination? Not at All     How many times did you typically get up at night to urinate? 2 Times     Total IPSS Score 8       Quality of Life due to urinary symptoms   If you were to spend the rest of your life with your urinary condition just the way it is now how would you feel about that? Pleased        Score:  1-7 Mild 8-19 Moderate 20-35 Severe  Erectile dysfunction His SHIM score is 9, which is moderate ED.   He has been having difficulty with erections for several years.   His is not interested in therapy.        SHIM    Row Name 07/21/17 1023         SHIM: Over the last 6 months:   How do you rate your confidence that you could get and keep an erection? Very Low     When you had erections with sexual stimulation, how often were your erections hard enough for penetration (entering your partner)? A Few Times (much less than half the time)     During sexual intercourse, how often were you able to maintain your erection after you had penetrated (entered) your partner? Almost Never or Never     During sexual intercourse, how difficult was it to maintain your  erection to completion of intercourse? Difficult     When you attempted sexual intercourse, how often was it satisfactory for you? A Few Times (much less than half the time)       SHIM Total Score   SHIM 9        Score: 1-7 Severe ED 8-11 Moderate ED 12-16 Mild-Moderate ED 17-21 Mild ED 22-25 No ED       PMH: Past Medical History:  Diagnosis Date  . Hypertension   . Presence of permanent cardiac pacemaker     Surgical History: Past Surgical History:  Procedure Laterality Date  . ANTERIOR CERVICAL DISCECTOMY  ?  . HAMMER TOE SURGERY Right 2007   2nd toe  . LUMBAR LAMINECTOMY/DECOMPRESSION MICRODISCECTOMY Bilateral 10/28/2016   Procedure: LUMBAR LAMINECTOMY/DECOMPRESSION MICRODISCECTOMY 3 LEVELS;  Surgeon: Meade Maw, MD;  Location: [facility] ORS;  Service: Neurosurgery;  Laterality: Bilateral;  L3-4, L4-5,L5-S1 Laminectomies and bilateral foraminotomies  . PACEMAKER INSERTION  2011    Home Medications:  Allergies as of 07/21/2017   No Known Allergies       Medication List       Accurate as of 07/21/17 11:05 AM. Always use your most recent med list.          aspirin EC 81 MG tablet Take 81 mg by mouth daily.   finasteride 5 MG tablet Commonly known as:  PROSCAR Take 1 tablet (5 mg total) by mouth daily.   gabapentin 300 MG capsule Commonly known as:  NEURONTIN Take 1 capsule (300 mg total) by mouth 3 (three) times daily.   hydrochlorothiazide 25 MG tablet Commonly known as:  HYDRODIURIL Take 25 mg by mouth daily.   HYDROcodone-acetaminophen 5-325 MG tablet Commonly known as:  NORCO/VICODIN Take 1-2 tablets by mouth every 4 (four) hours as needed (mild pain).   methocarbamol 500 MG tablet Commonly known as:  ROBAXIN Take 1 tablet (500 mg total) by mouth every 6 (six) hours as needed for muscle spasms.   polyethylene glycol packet Commonly known as:  MIRALAX / GLYCOLAX Take 17 g by mouth daily as needed.   potassium chloride SA 20 MEQ tablet Commonly known as:  K-DUR,KLOR-CON Take 20 mEq by mouth daily.   pravastatin 20 MG tablet Commonly known as:  PRAVACHOL Take 20 mg by mouth daily.   tamsulosin 0.4 MG Caps capsule Commonly known as:  FLOMAX Take 1 capsule (0.4 mg total) by mouth daily.       Allergies: No Known Allergies  Family History: Family History  Problem Relation Age of Onset  . Hypertension Father   . Stroke Father   . Colon cancer Father   . Aortic aneurysm Mother   . Prostate cancer Neg Hx   . Kidney cancer Neg Hx   . Bladder Cancer Neg Hx     Social History:  reports that he quit smoking about 41 years ago. His smoking use included Cigarettes. He has never used smokeless tobacco. He reports that he does not drink alcohol or use drugs.  ROS: UROLOGY Frequent Urination?: No Hard to postpone urination?: Yes Burning/pain with urination?: No Get up at night to urinate?: No Leakage of urine?: No Urine stream starts and stops?: No Trouble starting stream?: No Do you have to strain to  urinate?: No Blood in urine?: No Urinary tract infection?: No Sexually transmitted disease?: No Injury to kidneys or bladder?: No Painful intercourse?: No Weak stream?: No Erection problems?: No Penile pain?: No  Gastrointestinal Nausea?: No Vomiting?: No Indigestion/heartburn?: No  Diarrhea?: No Constipation?: No  Constitutional Fever: No Night sweats?: No Weight loss?: No Fatigue?: No  Skin Skin rash/lesions?: No Itching?: No  Eyes Blurred vision?: No Double vision?: No  Ears/Nose/Throat Sore throat?: No Sinus problems?: No  Hematologic/Lymphatic Swollen glands?: No Easy bruising?: No  Cardiovascular Leg swelling?: No Chest pain?: No  Respiratory Cough?: No Shortness of breath?: No  Endocrine Excessive thirst?: No  Musculoskeletal Back pain?: No Joint pain?: No  Neurological Headaches?: No Dizziness?: No  Psychologic Depression?: No Anxiety?: No  Physical Exam: BP 117/69   Pulse 74   Ht 5\' 3"  (1.6 m)   Wt 214 lb (97.1 kg)   BMI 37.91 kg/m   Constitutional: Well nourished. Alert and oriented, No acute distress. HEENT: [place] AT, moist mucus membranes. Trachea midline, no masses. Cardiovascular: No clubbing, cyanosis, or edema. Respiratory: Normal respiratory effort, no increased work of breathing. GI: Abdomen is soft, non tender, non distended, no abdominal masses. Liver and spleen not palpable.  No hernias appreciated.  Stool sample for occult testing is not indicated.   GU: No CVA tenderness.  No bladder fullness or masses.  Patient with uncircumcised phallus.  Foreskin easily retracted Urethral meatus is patent.  No penile discharge. No penile lesions or rashes. Scrotum without lesions, cysts, rashes and/or edema.  Testicles are located scrotally bilaterally. No masses are appreciated in the testicles. Left and right epididymis are normal. Rectal: Patient with  normal sphincter tone. Anus and perineum without scarring or rashes. No rectal  masses are appreciated. Prostate is approximately 45 grams, no nodules are appreciated. Seminal vesicles are normal. Skin: No rashes, bruises or suspicious lesions. Lymph: No cervical or inguinal adenopathy. Neurologic: Grossly intact, no focal deficits, moving all 4 extremities. Psychiatric: Normal mood and affect.  Laboratory Data: PSA History  7.12 ng/mL on 09/25/2016  2.6 ng/mL on 01/14/2017  3.3 ng/mL on 07/19/2017  Lab Results  Component Value Date   WBC 9.8 10/19/2016   HGB 14.8 10/19/2016   HCT 42.8 10/19/2016   MCV 93.2 10/19/2016   PLT 274 10/19/2016    Lab Results  Component Value Date   CREATININE 0.89 10/19/2016    Lab Results  Component Value Date   AST 24 10/19/2016   Lab Results  Component Value Date   ALT 22 10/19/2016   I have reviewed the labs  Assessment & Plan:    1. History of elevated PSA  - PSA has reduced to 3.3  - continue finasteride  2. BPH with LUTS  - IPSS score is 8/1, it is improving  - Continue conservative management, avoiding bladder irritants and timed voiding's  - Continue tamsulosin 0.4 mg daily and finasteride 5 mg daily: refills given  - RTC in 6 months for IPSS, PSA and exam   3. Erectile dysfunction  - Not addressed at this visit  Return in about 6 months (around 01/21/2018) for IPSS, PSA and exam.  These notes generated with voice recognition software. I apologize for typographical errors.  Zara Council, [facility] [address], [place], [place] [address] [phone]

## 2017-07-21 ENCOUNTER — Ambulatory Visit: Payer: Medicare Other | Admitting: Urology

## 2017-07-21 ENCOUNTER — Encounter: Payer: Self-pay | Admitting: Urology

## 2017-07-21 VITALS — BP 117/69 | HR 74 | Ht 63.0 in | Wt 214.0 lb

## 2017-07-21 DIAGNOSIS — N401 Enlarged prostate with lower urinary tract symptoms: Secondary | ICD-10-CM | POA: Diagnosis not present

## 2017-07-21 DIAGNOSIS — N529 Male erectile dysfunction, unspecified: Secondary | ICD-10-CM | POA: Diagnosis not present

## 2017-07-21 DIAGNOSIS — N138 Other obstructive and reflux uropathy: Secondary | ICD-10-CM | POA: Diagnosis not present

## 2017-07-21 DIAGNOSIS — Z87898 Personal history of other specified conditions: Secondary | ICD-10-CM

## 2018-02-01 ENCOUNTER — Other Ambulatory Visit: Payer: Medicare Other

## 2018-02-01 ENCOUNTER — Other Ambulatory Visit: Payer: Self-pay

## 2018-02-01 DIAGNOSIS — Z87898 Personal history of other specified conditions: Secondary | ICD-10-CM

## 2018-02-02 LAB — PSA: Prostate Specific Ag, Serum: 5.2 ng/mL — ABNORMAL HIGH (ref 0.0–4.0)

## 2018-02-02 NOTE — Progress Notes (Signed)
02/03/2018 11:08 AM   Richard Alexander Carver Fila [birth year] [id number]  Referring provider: Ezequiel Kayser, MD [facility] [place], [place] [address]  Chief Complaint  Patient presents with  . Follow-up    HPI: Patient is a 77 year old African American male who presents today for a six months follow up for an elevated PSA, BPH with LU TS and ED who was placed on finasteride 5 mg daily.  History of elevated PSA Patient was referred by Dr. Raechel Ache.  Patient was found to have a PSA of 7.12 ng/mL on 09/25/2016 during a follow up with his PCP. His current PSA is 5.2 ng/mL on 02/01/2018.   He is taking the finasteride daily.    BPH WITH LUTS  (prostate and/or bladder) His IPSS score today is 13, which is moderate lower urinary tract symptomatology. He is mostly satisfied with his quality life due to his urinary symptoms.  His previous IPSS score was 8/1.  His major complaint: nocturia  X 1.  He has had these symptoms for several years.  He denies any dysuria, hematuria or suprapubic pain.  He currently taking tamsulosin and finasteride.  He also denies any recent fevers, chills, nausea or vomiting.  He does not have a family history of PCa. IPSS    Row Name 02/03/18 1000         International Prostate Symptom Score   How often have you had the sensation of not emptying your bladder?  Almost always     How often have you had to urinate less than every two hours?  About half the time     How often have you found you stopped and started again several times when you urinated?  Not at All     How often have you found it difficult to postpone urination?  More than half the time     How often have you had a weak urinary stream?  Not at All     How often have you had to strain to start urination?  Not at All     How many times did you typically get up at night to urinate?  1 Time     Total IPSS Score  13       Quality of Life due to urinary symptoms   If you were to spend the  rest of your life with your urinary condition just the way it is now how would you feel about that?  Mostly Satisfied        Score:  1-7 Mild 8-19 Moderate 20-35 Severe  Erectile dysfunction He has been having difficulty with erections for several years.   His is not interested in therapy.    PMH: Past Medical History:  Diagnosis Date  . Hypertension   . Presence of permanent cardiac pacemaker     Surgical History: Past Surgical History:  Procedure Laterality Date  . ANTERIOR CERVICAL DISCECTOMY  ?  . HAMMER TOE SURGERY Right 2007   2nd toe  . LUMBAR LAMINECTOMY/DECOMPRESSION MICRODISCECTOMY Bilateral 10/28/2016   Procedure: LUMBAR LAMINECTOMY/DECOMPRESSION MICRODISCECTOMY 3 LEVELS;  Surgeon: Meade Maw, MD;  Location: [facility] ORS;  Service: Neurosurgery;  Laterality: Bilateral;  L3-4, L4-5,L5-S1 Laminectomies and bilateral foraminotomies  . PACEMAKER INSERTION  2011    Home Medications:  Allergies as of 02/03/2018   No Known Allergies     Medication List        Accurate as of 02/03/18 11:08 AM. Always use your most  recent med list.          aspirin EC 81 MG tablet Take 81 mg by mouth daily.   finasteride 5 MG tablet Commonly known as:  PROSCAR Take 1 tablet (5 mg total) by mouth daily.   gabapentin 300 MG capsule Commonly known as:  NEURONTIN Take 1 capsule (300 mg total) by mouth 3 (three) times daily.   hydrochlorothiazide 25 MG tablet Commonly known as:  HYDRODIURIL Take 25 mg by mouth daily.   polyethylene glycol packet Commonly known as:  MIRALAX / GLYCOLAX Take 17 g by mouth daily as needed.   potassium chloride SA 20 MEQ tablet Commonly known as:  K-DUR,KLOR-CON Take 20 mEq by mouth daily.   pravastatin 20 MG tablet Commonly known as:  PRAVACHOL Take 20 mg by mouth daily.   sildenafil 20 MG tablet Commonly known as:  REVATIO Take by mouth.   tamsulosin 0.4 MG Caps capsule Commonly known as:  FLOMAX Take 1 capsule (0.4 mg total) by  mouth daily.       Allergies: No Known Allergies  Family History: Family History  Problem Relation Age of Onset  . Hypertension Father   . Stroke Father   . Colon cancer Father   . Aortic aneurysm Mother   . Prostate cancer Neg Hx   . Kidney cancer Neg Hx   . Bladder Cancer Neg Hx     Social History:  reports that he quit smoking about 42 years ago. His smoking use included cigarettes. he has never used smokeless tobacco. He reports that he does not drink alcohol or use drugs.  ROS: UROLOGY Frequent Urination?: No Hard to postpone urination?: No Burning/pain with urination?: No Get up at night to urinate?: Yes Leakage of urine?: No Urine stream starts and stops?: No Trouble starting stream?: No Do you have to strain to urinate?: No Blood in urine?: No Urinary tract infection?: No Sexually transmitted disease?: No Injury to kidneys or bladder?: No Painful intercourse?: No Weak stream?: No Erection problems?: Yes Penile pain?: No  Gastrointestinal Nausea?: No Vomiting?: No Indigestion/heartburn?: No Diarrhea?: No Constipation?: No  Constitutional Fever: No Night sweats?: No Weight loss?: No Fatigue?: No  Skin Skin rash/lesions?: No Itching?: No  Eyes Blurred vision?: No Double vision?: No  Ears/Nose/Throat Sore throat?: No Sinus problems?: No  Hematologic/Lymphatic Swollen glands?: No Easy bruising?: No  Cardiovascular Leg swelling?: No Chest pain?: No  Respiratory Cough?: No Shortness of breath?: No  Endocrine Excessive thirst?: No  Musculoskeletal Back pain?: No Joint pain?: No  Neurological Headaches?: No Dizziness?: No  Psychologic Depression?: No Anxiety?: No  Physical Exam: BP (!) 157/90   Pulse 78   Ht 5\' 3"  (1.6 m)   Wt 225 lb (102.1 kg)   BMI 39.86 kg/m   Constitutional: Well nourished. Alert and oriented, No acute distress. HEENT: [place] AT, moist mucus membranes. Trachea midline, no masses. Cardiovascular: No  clubbing, cyanosis, or edema. Respiratory: Normal respiratory effort, no increased work of breathing. GI: Abdomen is soft, non tender, non distended, no abdominal masses. Liver and spleen not palpable.  No hernias appreciated.  Stool sample for occult testing is not indicated.   GU: No CVA tenderness.  No bladder fullness or masses.  Patient with uncircumcised phallus.  Foreskin easily retracted   Urethral meatus is patent.  No penile discharge. No penile lesions or rashes. Scrotum without lesions, cysts, rashes and/or edema.  Testicles are located scrotally bilaterally. No masses are appreciated in the testicles. Left and right  epididymis are normal. Rectal: Patient with  normal sphincter tone. Anus and perineum without scarring or rashes. No rectal masses are appreciated. Prostate is approximately 45 grams, firmer in the right lobe, no nodules are appreciated. Seminal vesicles are normal. Skin: No rashes, bruises or suspicious lesions. Lymph: No cervical or inguinal adenopathy. Neurologic: Grossly intact, no focal deficits, moving all 4 extremities. Psychiatric: Normal mood and affect.   Laboratory Data: PSA History  7.12 ng/mL on 09/25/2016  2.6 ng/mL on 01/14/2017  3.3 ng/mL on 07/19/2017   Component     Latest Ref Rng & Units 02/01/2018          Prostate Specific Ag, Serum     0.0 - 4.0 ng/mL 5.2 (H)    Lab Results  Component Value Date   WBC 9.8 10/19/2016   HGB 14.8 10/19/2016   HCT 42.8 10/19/2016   MCV 93.2 10/19/2016   PLT 274 10/19/2016    Lab Results  Component Value Date   CREATININE 0.89 10/19/2016    Lab Results  Component Value Date   AST 24 10/19/2016   Lab Results  Component Value Date   ALT 22 10/19/2016   I have reviewed the labs  Assessment & Plan:    1. History of elevated PSA  - PSA has increased to 5.2   - discussed with the patient that this may indicate a cancerous process in his prostate and due to his age his options at this time is to  stop screening and only address symptoms with the understanding of the risk of developing metastatic prostate cancer which may lead to pathological fractures, increase his monitoring intervals to q 3 months understanding the risk of delaying a cancer diagnosis, undergoing a MRI of the prostate or undergoing a prostate biopsy that has a 2% sepsis risk - his wife is currently terminally ill and he would like to defer his decision at this time - he will RTC in one month for a PSA  - continue finasteride  2. BPH with LUTS  - IPSS score is 13/2, it is worsening  - Continue conservative management, avoiding bladder irritants and timed voiding's  - Continue tamsulosin 0.4 mg daily and finasteride 5 mg daily: refills given  - patient returning in one month for PSA recheck   Return in about 1 month (around 03/03/2018) for PSA only.  These notes generated with voice recognition software. I apologize for typographical errors.  Zara Council, [facility] [address] [place], [place] [address] [phone]

## 2018-02-03 ENCOUNTER — Encounter: Payer: Self-pay | Admitting: Urology

## 2018-02-03 ENCOUNTER — Ambulatory Visit: Payer: Medicare Other | Admitting: Urology

## 2018-02-03 VITALS — BP 157/90 | HR 78 | Ht 63.0 in | Wt 225.0 lb

## 2018-02-03 DIAGNOSIS — Z87898 Personal history of other specified conditions: Secondary | ICD-10-CM | POA: Diagnosis not present

## 2018-02-03 DIAGNOSIS — N138 Other obstructive and reflux uropathy: Secondary | ICD-10-CM

## 2018-02-03 DIAGNOSIS — N401 Enlarged prostate with lower urinary tract symptoms: Secondary | ICD-10-CM

## 2018-02-03 MED ORDER — FINASTERIDE 5 MG PO TABS: 5.0000 mg | ORAL_TABLET | Freq: Every day | ORAL | 3 refills | Status: DC

## 2018-02-11 ENCOUNTER — Telehealth: Payer: Self-pay | Admitting: Urology

## 2018-02-11 ENCOUNTER — Other Ambulatory Visit: Payer: Self-pay | Admitting: Family Medicine

## 2018-02-11 DIAGNOSIS — N401 Enlarged prostate with lower urinary tract symptoms: Secondary | ICD-10-CM

## 2018-02-11 MED ORDER — FINASTERIDE 5 MG PO TABS: 5.0000 mg | ORAL_TABLET | Freq: Every day | ORAL | 3 refills | Status: DC

## 2018-03-02 ENCOUNTER — Other Ambulatory Visit: Payer: Self-pay

## 2018-03-02 DIAGNOSIS — Z87898 Personal history of other specified conditions: Secondary | ICD-10-CM

## 2018-03-03 ENCOUNTER — Other Ambulatory Visit: Payer: Medicare Other

## 2018-03-03 DIAGNOSIS — Z87898 Personal history of other specified conditions: Secondary | ICD-10-CM

## 2018-03-04 ENCOUNTER — Telehealth: Payer: Self-pay

## 2018-03-04 LAB — PSA: PROSTATE SPECIFIC AG, SERUM: 5 ng/mL — AB (ref 0.0–4.0)

## 2018-03-04 NOTE — Telephone Encounter (Signed)
-----   Message from Nori Riis, PA-C sent at 03/04/2018  7:45 AM EDT ----- Please add a free and total PSA to his blood work.

## 2018-03-04 NOTE — Telephone Encounter (Signed)
Labs were added via Marinus Maw with [employer].

## 2018-03-07 ENCOUNTER — Telehealth: Payer: Self-pay

## 2018-03-07 DIAGNOSIS — R972 Elevated prostate specific antigen [PSA]: Secondary | ICD-10-CM

## 2018-03-07 NOTE — Telephone Encounter (Signed)
Spoke with pt in reference PSA results and testing again in 3 months. Pt voiced understanding. Lab appt made and orders placed.

## 2018-03-07 NOTE — Telephone Encounter (Signed)
-----   Message from Nori Riis, PA-C sent at 03/06/2018  5:06 PM EDT ----- Please let Mr. Quinto know that his PSA remains at five.  According to the additional blood work, he has a 9% probability of having prostate cancer at this time.  I would suggest repeating the PSA in 3 months and continue the finasteride 5 mg daily.

## 2018-03-08 LAB — PSA, TOTAL AND FREE
PSA FREE PCT: 29.2 %
PSA FREE: 1.49 ng/mL
Prostate Specific Ag, Serum: 5.1 ng/mL — ABNORMAL HIGH (ref 0.0–4.0)

## 2018-03-08 LAB — SPECIMEN STATUS REPORT

## 2018-03-26 ENCOUNTER — Other Ambulatory Visit: Payer: Self-pay | Admitting: Urology

## 2018-04-20 NOTE — Telephone Encounter (Signed)
Error

## 2018-06-08 HISTORY — PX: OTHER SURGICAL HISTORY: SHX169

## 2018-06-09 ENCOUNTER — Other Ambulatory Visit: Payer: Self-pay

## 2018-06-13 ENCOUNTER — Other Ambulatory Visit: Payer: Medicare Other

## 2018-06-13 DIAGNOSIS — R972 Elevated prostate specific antigen [PSA]: Secondary | ICD-10-CM

## 2018-06-14 ENCOUNTER — Telehealth: Payer: Self-pay | Admitting: Family Medicine

## 2018-06-14 LAB — PSA: PROSTATE SPECIFIC AG, SERUM: 5.3 ng/mL — AB (ref 0.0–4.0)

## 2018-06-14 NOTE — Telephone Encounter (Signed)
-----   Message from Nori Riis, PA-C sent at 06/14/2018 11:46 AM EDT ----- Please let Mr. Temme know that his PSA has not decreased while he has been on the finasteride.  As we discussed at his visit, this is concerning for prostate cancer and I would recommend a biopsy at this time.

## 2018-06-14 NOTE — Telephone Encounter (Signed)
Patient notified and appointment for Biopsy has been scheduled. Instructions were given over the phone, Mailed to patient with appointment day and time. A Clearance sheet was faxed to Dr. Ubaldo Glassing to stop Aspirin. (Pending response)

## 2018-06-14 NOTE — Telephone Encounter (Signed)
-----   Message from Nori Riis, PA-C sent at 06/14/2018 11:46 AM EDT ----- Please let Mr. Burdell know that his PSA has not decreased while he has been on the finasteride.  As we discussed at his visit, this is concerning for prostate cancer and I would recommend a biopsy at this time.

## 2018-07-22 ENCOUNTER — Encounter: Payer: Self-pay | Admitting: *Deleted

## 2018-07-22 ENCOUNTER — Other Ambulatory Visit: Payer: Medicare Other | Admitting: Urology

## 2018-07-25 ENCOUNTER — Ambulatory Visit: Payer: Medicare Other | Admitting: Anesthesiology

## 2018-07-25 ENCOUNTER — Ambulatory Visit
Admission: RE | Admit: 2018-07-25 | Discharge: 2018-07-25 | Disposition: A | Discharge status: Home / Self Care | Payer: Medicare Other | Source: Ambulatory Visit | Attending: Unknown Physician Specialty | Admitting: Unknown Physician Specialty

## 2018-07-25 ENCOUNTER — Encounter: Payer: Self-pay | Admitting: *Deleted

## 2018-07-25 ENCOUNTER — Other Ambulatory Visit: Payer: Self-pay

## 2018-07-25 ENCOUNTER — Encounter
Admission: RE | Disposition: A | Discharge status: Home / Self Care | Payer: Self-pay | Source: Ambulatory Visit | Attending: Unknown Physician Specialty

## 2018-07-25 DIAGNOSIS — Z86010 Personal history of colonic polyps: Secondary | ICD-10-CM | POA: Diagnosis not present

## 2018-07-25 DIAGNOSIS — G4730 Sleep apnea, unspecified: Secondary | ICD-10-CM | POA: Insufficient documentation

## 2018-07-25 DIAGNOSIS — M5030 Other cervical disc degeneration, unspecified cervical region: Secondary | ICD-10-CM | POA: Diagnosis not present

## 2018-07-25 DIAGNOSIS — K5730 Diverticulosis of large intestine without perforation or abscess without bleeding: Secondary | ICD-10-CM | POA: Diagnosis not present

## 2018-07-25 DIAGNOSIS — K640 First degree hemorrhoids: Secondary | ICD-10-CM | POA: Insufficient documentation

## 2018-07-25 DIAGNOSIS — I10 Essential (primary) hypertension: Secondary | ICD-10-CM | POA: Insufficient documentation

## 2018-07-25 DIAGNOSIS — Z87891 Personal history of nicotine dependence: Secondary | ICD-10-CM | POA: Diagnosis not present

## 2018-07-25 DIAGNOSIS — Z7982 Long term (current) use of aspirin: Secondary | ICD-10-CM | POA: Insufficient documentation

## 2018-07-25 DIAGNOSIS — Z1211 Encounter for screening for malignant neoplasm of colon: Secondary | ICD-10-CM | POA: Insufficient documentation

## 2018-07-25 DIAGNOSIS — Z79899 Other long term (current) drug therapy: Secondary | ICD-10-CM | POA: Diagnosis not present

## 2018-07-25 HISTORY — DX: Unspecified osteoarthritis, unspecified site: M19.90

## 2018-07-25 HISTORY — PX: COLONOSCOPY WITH PROPOFOL: SHX5780

## 2018-07-25 HISTORY — DX: Other cervical disc degeneration, unspecified cervical region: M50.30

## 2018-07-25 HISTORY — DX: Allergic rhinitis, unspecified: J30.9

## 2018-07-25 HISTORY — DX: Sleep apnea, unspecified: G47.30

## 2018-07-25 SURGERY — COLONOSCOPY WITH PROPOFOL
Anesthesia: General

## 2018-07-25 MED ORDER — MIDAZOLAM HCL 5 MG/5ML IJ SOLN
INTRAMUSCULAR | Status: DC | PRN
  Administered 2018-07-25 (×2): 1 mg via INTRAVENOUS

## 2018-07-25 MED ORDER — SODIUM CHLORIDE 0.9 % IV SOLN: INTRAVENOUS | Status: DC

## 2018-07-25 MED ORDER — SODIUM CHLORIDE 0.9 % IV SOLN
INTRAVENOUS | Status: DC
  Administered 2018-07-25: 13:00:00 via INTRAVENOUS

## 2018-07-25 MED ORDER — PROPOFOL 10 MG/ML IV BOLUS
INTRAVENOUS | Status: DC | PRN
  Administered 2018-07-25: 30 mg via INTRAVENOUS
  Administered 2018-07-25: 10 mg via INTRAVENOUS

## 2018-07-25 MED ORDER — LIDOCAINE HCL (PF) 2 % IJ SOLN
INTRAMUSCULAR | Status: AC
  Filled 2018-07-25: qty 10

## 2018-07-25 MED ORDER — LIDOCAINE HCL (PF) 2 % IJ SOLN
INTRAMUSCULAR | Status: DC | PRN
  Administered 2018-07-25: 100 mg

## 2018-07-25 MED ORDER — FENTANYL CITRATE (PF) 100 MCG/2ML IJ SOLN
INTRAMUSCULAR | Status: AC
  Filled 2018-07-25: qty 2

## 2018-07-25 MED ORDER — PROPOFOL 500 MG/50ML IV EMUL
INTRAVENOUS | Status: DC | PRN
  Administered 2018-07-25: 50 ug/kg/min via INTRAVENOUS

## 2018-07-25 MED ORDER — MIDAZOLAM HCL 2 MG/2ML IJ SOLN
INTRAMUSCULAR | Status: AC
  Filled 2018-07-25: qty 2

## 2018-07-25 MED ORDER — FENTANYL CITRATE (PF) 100 MCG/2ML IJ SOLN
INTRAMUSCULAR | Status: DC | PRN
  Administered 2018-07-25 (×2): 50 ug via INTRAVENOUS

## 2018-07-25 NOTE — H&P (Signed)
Primary Care Physician:  Ezequiel Kayser, MD Primary Gastroenterologist:  Dr. Vira Agar  Pre-Procedure History & Physical: HPI:  Richard Alexander is a 77 y.o. male is here for an colonoscopy.  For personal history of colon polyps.   Past Medical History:  Diagnosis Date  . Allergic rhinitis   . Arthritis   . DDD (degenerative disc disease), cervical   . Hypertension   . Presence of permanent cardiac pacemaker   . Sleep apnea    CPAP    Past Surgical History:  Procedure Laterality Date  . adenomatous polyp  06/08/2018  . ANTERIOR CERVICAL DISCECTOMY  ?  Marland Kitchen BACK SURGERY    . HAMMER TOE SURGERY Right 2007   2nd toe  . INSERT / REPLACE / REMOVE PACEMAKER    . LUMBAR LAMINECTOMY/DECOMPRESSION MICRODISCECTOMY Bilateral 10/28/2016   Procedure: LUMBAR LAMINECTOMY/DECOMPRESSION MICRODISCECTOMY 3 LEVELS;  Surgeon: Meade Maw, MD;  Location: [facility] ORS;  Service: Neurosurgery;  Laterality: Bilateral;  L3-4, L4-5,L5-S1 Laminectomies and bilateral foraminotomies  . PACEMAKER INSERTION  2011    Prior to Admission medications   Medication Sig Start Date End Date Taking? Authorizing Provider  aspirin EC 81 MG tablet Take 81 mg by mouth daily.   Yes [provider]  fexofenadine-pseudoephedrine (ALLEGRA-D 24) 180-240 MG 24 hr tablet Take 1 tablet by mouth daily as needed.   Yes [provider]  finasteride (PROSCAR) 5 MG tablet Take 1 tablet (5 mg total) by mouth daily. 02/11/18  Yes McGowan, Larene Beach A, PA-C  hydrochlorothiazide (HYDRODIURIL) 25 MG tablet Take 25 mg by mouth daily.   Yes [provider]  polyethylene glycol (MIRALAX / GLYCOLAX) packet Take 17 g by mouth daily as needed.    Yes [provider]  potassium chloride SA (K-DUR,KLOR-CON) 20 MEQ tablet Take 20 mEq by mouth daily.    Yes [provider]  pravastatin (PRAVACHOL) 20 MG tablet Take 20 mg by mouth daily.   Yes [provider]  sildenafil (REVATIO) 20 MG tablet Take by  mouth. 03/25/15  Yes [provider]  tamsulosin (FLOMAX) 0.4 MG CAPS capsule TAKE 1 CAPSULE BY MOUTH ONCE DAILY 03/27/18  Yes McGowan, Larene Beach A, PA-C  benzonatate (TESSALON) 200 MG capsule Take 200 mg by mouth 3 (three) times daily as needed for cough.    [provider]  cefUROXime (CEFTIN) 500 MG tablet Take 500 mg by mouth 2 (two) times daily with a meal.    [provider]  gabapentin (NEURONTIN) 300 MG capsule Take 1 capsule (300 mg total) by mouth 3 (three) times daily. Patient not taking: Reported on 07/25/2018 10/29/16   Meade Maw, MD    Allergies as of 05/23/2018  . (No Known Allergies)    Family History  Problem Relation Age of Onset  . Hypertension Father   . Stroke Father   . Colon cancer Father   . Aortic aneurysm Mother   . Prostate cancer Neg Hx   . Kidney cancer Neg Hx   . Bladder Cancer Neg Hx     Social History   Socioeconomic History  . Marital status: Married    Spouse name: Not on file  . Number of children: Not on file  . Years of education: Not on file  . Highest education level: Not on file  Occupational History  . Not on file  Social Needs  . Financial resource strain: Not on file  . Food insecurity:    Worry: Not on file    Inability:  Not on file  . Transportation needs:    Medical: Not on file    Non-medical: Not on file  Tobacco Use  . Smoking status: Former Smoker    Types: Cigarettes    Last attempt to quit: 10/20/1975    Years since quitting: 42.7  . Smokeless tobacco: Never Used  Substance and Sexual Activity  . Alcohol use: No  . Drug use: No  . Sexual activity: Not on file  Lifestyle  . Physical activity:    Days per week: Not on file    Minutes per session: Not on file  . Stress: Not on file  Relationships  . Social connections:    Talks on phone: Not on file    Gets together: Not on file    Attends religious service: Not on file    Active member of club or organization: Not on file     Attends meetings of clubs or organizations: Not on file    Relationship status: Not on file  . Intimate partner violence:    Fear of current or ex partner: Not on file    Emotionally abused: Not on file    Physically abused: Not on file    Forced sexual activity: Not on file  Other Topics Concern  . Not on file  Social History Narrative  . Not on file    Review of Systems: See HPI, otherwise negative ROS  Physical Exam: BP (!) 141/85   Pulse 81   Temp (!) 95.4 F (35.2 C) (Tympanic)   Resp 20   Ht 5\' 3"  (1.6 m)   Wt 102.1 kg (225 lb)   SpO2 99%   BMI 39.86 kg/m  General:   Alert,  pleasant and cooperative in NAD Head:  Normocephalic and atraumatic. Neck:  Supple; no masses or thyromegaly. Lungs:  Clear throughout to auscultation.    Heart:  Regular rate and rhythm. Abdomen:  Soft, nontender and nondistended. Normal bowel sounds, without guarding, and without rebound.   Neurologic:  Alert and  oriented x4;  grossly normal neurologically.  Impression/Plan: Richard Alexander is here for an colonoscopy to be performed for personal history of colon polyps.  Risks, benefits, limitations, and alternatives regarding  colonoscopy have been reviewed with the patient.  Questions have been answered.  All parties agreeable.   Gaylyn Cheers, MD  07/25/2018, 1:46 PM

## 2018-07-25 NOTE — Anesthesia Post-op Follow-up Note (Signed)
Anesthesia QCDR form completed.        

## 2018-07-25 NOTE — Transfer of Care (Signed)
Immediate Anesthesia Transfer of Care Note  Patient: Richard Alexander  Procedure(s) Performed: COLONOSCOPY WITH PROPOFOL (N/A )  Patient Location: PACU  Anesthesia Type:General  Level of Consciousness: sedated  Airway & Oxygen Therapy: Patient Spontanous Breathing and Patient connected to nasal cannula oxygen  Post-op Assessment: Report given to RN and Post -op Vital signs reviewed and stable  Post vital signs: Reviewed and stable  Last Vitals:  Vitals Value Taken Time  BP    Temp    Pulse    Resp    SpO2      Last Pain:  Vitals:   07/25/18 1255  TempSrc: Tympanic  PainSc: 0-No pain         Complications: No apparent anesthesia complications

## 2018-07-25 NOTE — Anesthesia Preprocedure Evaluation (Signed)
Anesthesia Evaluation  Patient identified by MRN, date of birth, ID band Patient awake    Reviewed: Allergy & Precautions, H&P , NPO status , Patient's Chart, lab work & pertinent test results, reviewed documented beta blocker date and time   Airway Mallampati: II   Neck ROM: full    Dental  (+) Poor Dentition   Pulmonary neg pulmonary ROS, sleep apnea and Continuous Positive Airway Pressure Ventilation , former smoker,    Pulmonary exam normal        Cardiovascular Exercise Tolerance: Good hypertension, On Medications negative cardio ROS Normal cardiovascular exam+ pacemaker  Rhythm:regular Rate:Normal     Neuro/Psych  Neuromuscular disease negative neurological ROS  negative psych ROS   GI/Hepatic negative GI ROS, Neg liver ROS,   Endo/Other  negative endocrine ROS  Renal/GU negative Renal ROS  negative genitourinary   Musculoskeletal   Abdominal   Peds  Hematology negative hematology ROS (+) anemia ,   Anesthesia Other Findings Past Medical History: No date: Allergic rhinitis No date: Arthritis No date: DDD (degenerative disc disease), cervical No date: Hypertension No date: Presence of permanent cardiac pacemaker No date: Sleep apnea     Comment:  CPAP Past Surgical History: 06/08/2018: adenomatous polyp ?: ANTERIOR CERVICAL DISCECTOMY No date: BACK SURGERY 2007: HAMMER TOE SURGERY; Right     Comment:  2nd toe No date: INSERT / REPLACE / REMOVE PACEMAKER 10/28/2016: LUMBAR LAMINECTOMY/DECOMPRESSION MICRODISCECTOMY; Bilateral     Comment:  Procedure: LUMBAR LAMINECTOMY/DECOMPRESSION               MICRODISCECTOMY 3 LEVELS;  Surgeon: Meade Maw,               MD;  Location: [facility] ORS;  Service: Neurosurgery;                Laterality: Bilateral;  L3-4, L4-5,L5-S1 Laminectomies               and bilateral foraminotomies 2011: PACEMAKER INSERTION BMI    Body Mass Index:  39.86 kg/m     Reproductive/Obstetrics negative OB ROS                             Anesthesia Physical Anesthesia Plan  ASA: III  Anesthesia Plan: General   Post-op Pain Management:    Induction:   PONV Risk Score and Plan:   Airway Management Planned:   Additional Equipment:   Intra-op Plan:   Post-operative Plan:   Informed Consent: I have reviewed the patients History and Physical, chart, labs and discussed the procedure including the risks, benefits and alternatives for the proposed anesthesia with the patient or authorized representative who has indicated his/her understanding and acceptance.   Dental Advisory Given  Plan Discussed with: CRNA  Anesthesia Plan Comments:         Anesthesia Quick Evaluation

## 2018-07-25 NOTE — Op Note (Signed)
[facility] Gastroenterology Patient Name: Richard Alexander Procedure Date: 07/25/2018 1:48 PM MRN: [id number] Account #: [id number] Date of Birth: 01/03/1941 Admit Type: Outpatient Age: 77 Room: [facility] ENDO ROOM 3 Gender: Male Note Status: Finalized Procedure:            Colonoscopy Indications:          High risk colon cancer surveillance: Personal history                        of colonic polyps Providers:            Manya Silvas, MD Referring MD:         Christena Flake. Raechel Ache, MD (Referring MD) Medicines:            Propofol per Anesthesia Complications:        No immediate complications. Procedure:            Pre-Anesthesia Assessment:                       - After reviewing the risks and benefits, the patient                        was deemed in satisfactory condition to undergo the                        procedure.                       After obtaining informed consent, the colonoscope was                        passed under direct vision. Throughout the procedure,                        the patient's blood pressure, pulse, and oxygen                        saturations were monitored continuously. The                        Colonoscope was introduced through the anus and                        advanced to the the cecum, identified by appendiceal                        orifice and ileocecal valve. The colonoscopy was                        performed without difficulty. The patient tolerated the                        procedure well. The quality of the bowel preparation                        was excellent. Findings:      A few small-mouthed diverticula were found in the descending colon,       hepatic flexure and ascending colon.      Internal hemorrhoids were found during endoscopy. The hemorrhoids were       small, medium-sized and Grade I (internal hemorrhoids  that do not       prolapse).      The exam was otherwise without abnormality. Impression:            - Diverticulosis in the descending colon, at the                        hepatic flexure and in the ascending colon.                       - Internal hemorrhoids.                       - The examination was otherwise normal.                       - No specimens collected. Recommendation:       - Repeat colonoscopy in 5 years for surveillance. Manya Silvas, MD 07/25/2018 2:20:58 PM This report has been signed electronically. Number of Addenda: 0 Note Initiated On: 07/25/2018 1:48 PM Scope Withdrawal Time: 0 hours 11 minutes 11 seconds  Total Procedure Duration: 0 hours 19 minutes 48 seconds       [facility]

## 2018-07-26 ENCOUNTER — Encounter: Payer: Self-pay | Admitting: Unknown Physician Specialty

## 2018-07-26 NOTE — Anesthesia Postprocedure Evaluation (Signed)
Anesthesia Post Note  Patient: [name]  Procedure(s) Performed: COLONOSCOPY WITH PROPOFOL (N/A )  Patient location during evaluation: PACU Anesthesia Type: General Level of consciousness: awake and alert Pain management: pain level controlled Vital Signs Assessment: post-procedure vital signs reviewed and stable Respiratory status: spontaneous breathing, nonlabored ventilation, respiratory function stable and patient connected to nasal cannula oxygen Cardiovascular status: blood pressure returned to baseline and stable Postop Assessment: no apparent nausea or vomiting Anesthetic complications: no     Last Vitals:  Vitals:   07/25/18 1255 07/25/18 1420  BP: (!) 141/85 115/70  Pulse: 81 71  Resp: 20 16  Temp: (!) 35.2 C (!) 36.4 C  SpO2: 99%     Last Pain:  Vitals:   07/26/18 0749  TempSrc:   PainSc: 0-No pain                 Molli Barrows

## 2018-08-05 ENCOUNTER — Ambulatory Visit: Payer: Medicare Other | Admitting: Urology

## 2018-08-12 ENCOUNTER — Other Ambulatory Visit: Payer: Self-pay | Admitting: Urology

## 2018-08-12 ENCOUNTER — Encounter: Payer: Self-pay | Admitting: Urology

## 2018-08-12 ENCOUNTER — Ambulatory Visit: Payer: Medicare Other | Admitting: Urology

## 2018-08-12 VITALS — BP 120/62 | HR 71 | Ht 63.0 in | Wt 204.0 lb

## 2018-08-12 DIAGNOSIS — R972 Elevated prostate specific antigen [PSA]: Secondary | ICD-10-CM | POA: Diagnosis not present

## 2018-08-12 MED ORDER — LEVOFLOXACIN 500 MG PO TABS
500.0000 mg | ORAL_TABLET | Freq: Once | ORAL | Status: AC
  Administered 2018-08-12: 500 mg via ORAL

## 2018-08-12 MED ORDER — GENTAMICIN SULFATE 40 MG/ML IJ SOLN
80.0000 mg | Freq: Once | INTRAMUSCULAR | Status: AC
  Administered 2018-08-12: 80 mg via INTRAMUSCULAR

## 2018-08-12 NOTE — Progress Notes (Signed)
   08/12/18  CC:  Chief Complaint  Patient presents with  . Prostate Biopsy    HPI: 77 year old male with elevated PSA recently started on finasteride without a decline in his PSA as anticipated.  As such, he was counseled to undergo prostate biopsy.  His PSA has continued to climb.  His most recent PSA was 5.3 on 06/13/2018.    Prostate exam by Zara Council was noted to be firmer on the right but no discrete nodules.  Blood pressure 120/62, pulse 71, height 5\' 3"  (1.6 m), weight 204 lb (92.5 kg). NED. A&Ox3.   No respiratory distress   Abd soft, NT, ND Normal sphincter tone  Prostate Biopsy Procedure   Informed consent was obtained after discussing risks/benefits of the procedure.  A time out was performed to ensure correct patient identity.  Pre-Procedure: - Gentamicin given prophylactically - Levaquin 500 mg administered PO -Transrectal Ultrasound performed revealing a 26 gm prostate -No median lobe appreciated although the prostate had a nodular appearance particularly on the right side.  Procedure: - Prostate block performed using 10 cc 1% lidocaine and biopsies taken from sextant areas, a total of 12 under ultrasound guidance.  Post-Procedure: - Patient tolerated the procedure well - He was counseled to seek immediate medical attention if experiences any severe pain, significant bleeding, or fevers - Return in one to two week to discuss biopsy results   Hollice Espy, MD

## 2018-08-17 ENCOUNTER — Other Ambulatory Visit: Payer: Self-pay | Admitting: Urology

## 2018-08-17 LAB — PATHOLOGY REPORT

## 2018-08-30 ENCOUNTER — Ambulatory Visit (INDEPENDENT_AMBULATORY_CARE_PROVIDER_SITE_OTHER): Payer: Medicare Other | Admitting: Urology

## 2018-08-30 ENCOUNTER — Encounter: Payer: Self-pay | Admitting: Urology

## 2018-08-30 VITALS — BP 114/69 | HR 81 | Ht 63.0 in | Wt 206.0 lb

## 2018-08-30 DIAGNOSIS — N401 Enlarged prostate with lower urinary tract symptoms: Secondary | ICD-10-CM

## 2018-08-30 DIAGNOSIS — C61 Malignant neoplasm of prostate: Secondary | ICD-10-CM | POA: Diagnosis not present

## 2018-08-30 DIAGNOSIS — N138 Other obstructive and reflux uropathy: Secondary | ICD-10-CM | POA: Diagnosis not present

## 2018-08-30 DIAGNOSIS — N529 Male erectile dysfunction, unspecified: Secondary | ICD-10-CM | POA: Diagnosis not present

## 2018-08-30 NOTE — Progress Notes (Signed)
08/30/2018 1:08 PM   Vernard Gambles Carver Fila 1941/07/01 [id number]  Referring provider: Ezequiel Kayser, MD [place] [facility] [place], [place] [address]  Chief Complaint  Patient presents with  . Prostate Cancer    biopsy results    HPI: 77 year old male who presents today to discuss his newly diagnosed prostate cancer.  He underwent prostate biopsy on 08/12/2018 for an elevated PSA rising on finasteride, 5.3 at the time of biopsy (10.6 adjusted).  He was also noted to have induration on the right side of his prostate gland on rectal exam.  Transrectal ultrasound volume 26 g.  Prostate biopsy showed 7/12 cores positive of Gleason 3+4 and 3+3 prostate cancer.  His primary tumor was on the right with 3 cores of Gleason 3+4 involving up to 86% of the tissue right apex and mid gland.  The remainder of the prostate showed low volume Gleason 3+3 dispersed throughout the prostate.  Urinary symptoms are fairly well-controlled on finasteride and Flomax.  He does have baseline erectile dysfunction.  IPSS syndrome as below.  IPSS    Row Name 08/30/18 1100         International Prostate Symptom Score   How often have you had the sensation of not emptying your bladder?  Almost always     How often have you had to urinate less than every two hours?  About half the time     How often have you found you stopped and started again several times when you urinated?  Not at All     How often have you found it difficult to postpone urination?  About half the time     How often have you had a weak urinary stream?  Not at All     How often have you had to strain to start urination?  Not at All     How many times did you typically get up at night to urinate?  None     Total IPSS Score  11       Quality of Life due to urinary symptoms   If you were to spend the rest of your life with your urinary condition just the way it is now how would you feel about that?  Pleased        Score:    1-7 Mild 8-19 Moderate 20-35 Severe  SHIM    Row Name 08/30/18 1129         SHIM: Over the last 6 months:   How do you rate your confidence that you could get and keep an erection?  Low     When you had erections with sexual stimulation, how often were your erections hard enough for penetration (entering your partner)?  Sometimes (about half the time)     During sexual intercourse, how often were you able to maintain your erection after you had penetrated (entered) your partner?  Sometimes (about half the time)     During sexual intercourse, how difficult was it to maintain your erection to completion of intercourse?  Difficult     When you attempted sexual intercourse, how often was it satisfactory for you?  A Few Times (much less than half the time)       SHIM Total Score   SHIM  13           PMH: Past Medical History:  Diagnosis Date  . Allergic rhinitis   . Arthritis   . DDD (degenerative disc disease), cervical   .  Hypertension   . Presence of permanent cardiac pacemaker   . Sleep apnea    CPAP    Surgical History: Past Surgical History:  Procedure Laterality Date  . adenomatous polyp  06/08/2018  . ANTERIOR CERVICAL DISCECTOMY  ?  Marland Kitchen BACK SURGERY    . COLONOSCOPY WITH PROPOFOL N/A 07/25/2018   Procedure: COLONOSCOPY WITH PROPOFOL;  Surgeon: Manya Silvas, MD;  Location: [facility] ENDOSCOPY;  Service: Endoscopy;  Laterality: N/A;  . HAMMER TOE SURGERY Right 2007   2nd toe  . INSERT / REPLACE / REMOVE PACEMAKER    . LUMBAR LAMINECTOMY/DECOMPRESSION MICRODISCECTOMY Bilateral 10/28/2016   Procedure: LUMBAR LAMINECTOMY/DECOMPRESSION MICRODISCECTOMY 3 LEVELS;  Surgeon: Meade Maw, MD;  Location: [facility] ORS;  Service: Neurosurgery;  Laterality: Bilateral;  L3-4, L4-5,L5-S1 Laminectomies and bilateral foraminotomies  . PACEMAKER INSERTION  2011    Home Medications:  Allergies as of 08/30/2018   No Known Allergies     Medication List        Accurate as of  08/30/18  1:08 PM. Always use your most recent med list.          aspirin EC 81 MG tablet Take 81 mg by mouth daily.   benzonatate 200 MG capsule Commonly known as:  TESSALON Take 200 mg by mouth 3 (three) times daily as needed for cough.   cefUROXime 500 MG tablet Commonly known as:  CEFTIN Take 500 mg by mouth 2 (two) times daily with a meal.   fexofenadine-pseudoephedrine 180-240 MG 24 hr tablet Commonly known as:  ALLEGRA-D 24 Take 1 tablet by mouth daily as needed.   finasteride 5 MG tablet Commonly known as:  PROSCAR Take 1 tablet (5 mg total) by mouth daily.   gabapentin 300 MG capsule Commonly known as:  NEURONTIN Take 1 capsule (300 mg total) by mouth 3 (three) times daily.   hydrochlorothiazide 25 MG tablet Commonly known as:  HYDRODIURIL Take 25 mg by mouth daily.   polyethylene glycol packet Commonly known as:  MIRALAX / GLYCOLAX Take 17 g by mouth daily as needed.   potassium chloride SA 20 MEQ tablet Commonly known as:  K-DUR,KLOR-CON Take 20 mEq by mouth daily.   pravastatin 20 MG tablet Commonly known as:  PRAVACHOL Take 20 mg by mouth daily.   sildenafil 20 MG tablet Commonly known as:  REVATIO Take by mouth.   tamsulosin 0.4 MG Caps capsule Commonly known as:  FLOMAX TAKE 1 CAPSULE BY MOUTH ONCE DAILY       Allergies: No Known Allergies  Family History: Family History  Problem Relation Age of Onset  . Hypertension Father   . Stroke Father   . Colon cancer Father   . Aortic aneurysm Mother   . Prostate cancer Neg Hx   . Kidney cancer Neg Hx   . Bladder Cancer Neg Hx     Social History:  reports that he quit smoking about 42 years ago. His smoking use included cigarettes. He has never used smokeless tobacco. He reports that he does not drink alcohol or use drugs.  ROS: UROLOGY Frequent Urination?: No Hard to postpone urination?: No Burning/pain with urination?: No Get up at night to urinate?: No Leakage of urine?: No Urine  stream starts and stops?: No Trouble starting stream?: No Do you have to strain to urinate?: No Blood in urine?: No Urinary tract infection?: No Sexually transmitted disease?: No Injury to kidneys or bladder?: No Painful intercourse?: No Weak stream?: No Erection problems?: Yes Penile pain?: No  Gastrointestinal Nausea?: No Vomiting?: No Indigestion/heartburn?: No Diarrhea?: No Constipation?: No  Constitutional Fever: No Night sweats?: No Weight loss?: No Fatigue?: No  Skin Skin rash/lesions?: No Itching?: No  Eyes Blurred vision?: No Double vision?: No  Ears/Nose/Throat Sore throat?: No Sinus problems?: No  Hematologic/Lymphatic Swollen glands?: No Easy bruising?: No  Cardiovascular Leg swelling?: No Chest pain?: No  Respiratory Cough?: No Shortness of breath?: No  Endocrine Excessive thirst?: No  Musculoskeletal Back pain?: No Joint pain?: No  Neurological Headaches?: No Dizziness?: No  Psychologic Depression?: No Anxiety?: No  Physical Exam: BP 114/69   Pulse 81   Ht 5\' 3"  (1.6 m)   Wt 206 lb (93.4 kg)   BMI 36.49 kg/m   Constitutional:  Alert and oriented, No acute distress. HEENT: [place] AT, moist mucus membranes.  Trachea midline, no masses. Cardiovascular: No clubbing, cyanosis, or edema. Respiratory: Normal respiratory effort, no increased work of breathing. Neurologic: Grossly intact, no focal deficits, moving all 4 extremities. Psychiatric: Normal mood and affect.  Laboratory Data: Lab Results  Component Value Date   WBC 9.8 10/19/2016   HGB 14.8 10/19/2016   HCT 42.8 10/19/2016   MCV 93.2 10/19/2016   PLT 274 10/19/2016    Lab Results  Component Value Date   CREATININE 0.89 10/19/2016    Component     Latest Ref Rng & Units 10/20/2016 10/20/2016 01/14/2017 07/19/2017        10:51 AM 10:51 AM    Prostate Specific Ag, Serum     0.0 - 4.0 ng/mL 8.4 (H) 8.4 (H) 2.6 3.3   Component     Latest Ref Rng & Units  02/01/2018 03/03/2018 03/03/2018 06/13/2018         11:10 AM 11:10 AM   Prostate Specific Ag, Serum     0.0 - 4.0 ng/mL 5.2 (H) 5.0 (H) 5.1 (H) 5.3 (H)    Urinalysis N/a  Pertinent Imaging: n/a  Assessment & Plan:    1. Prostate cancer [facility]) Newly diagnosed Gleason 3+4 intermediate risk disease, cT2  The patient was counseled about the natural history of prostate cancer and the standard treatment options that are available for prostate cancer. It was explained to him how his age and life expectancy, clinical stage, Gleason score, and PSA affect his prognosis, the decision to proceed with additional staging studies, as well as how that information influences recommended treatment strategies. We discussed the roles for active surveillance, radiation therapy, surgical therapy, androgen deprivation, as well as ablative therapy options for the treatment of prostate cancer as appropriate to his individual cancer situation. We discussed the risks and benefits of these options with regard to their impact on cancer control and also in terms of potential adverse events, complications, and impact on quality of life particularly related to urinary, bowel, and sexual function. The patient was encouraged to ask questions throughout the discussion today and all questions were answered to his stated satisfaction. In addition, the patient was providedwith and/or directed to appropriate resources and literature for further education about prostate cancer treatment options.  Given his age, he would be a better candidate for either brachytherapy or external beam radiation with Lupron x6 months.  Will refer to radiation oncology, Dr. Baruch Gouty to further discussion of this.  We did go ahead and discuss side effects of radiation today somewhat as well as side effects of Lupron including hot flashes, weight gain, loss of muscle mass, decreased libido amongst others.  All questions were answered today.  We will  follow-up  with him depending on how he proceeds with Dr. Baruch Gouty.  - Ambulatory referral to Radiation Oncology  2. BPH with obstruction/lower urinary tract symptoms Continue Flomax and finasteride, symptoms fairly well controlled on these 2 medications  3. Erectile dysfunction of organic origin Baseline erectile dysfunction, uses sildenafil as needed  Hollice Espy, MD  [place] [address], [place], [place] [address] [phone]  I spent 25 min with this patient of which greater than 50% was spent in counseling and coordination of care with the patient.

## 2018-09-05 ENCOUNTER — Institutional Professional Consult (permissible substitution): Payer: Medicare Other | Admitting: Radiation Oncology

## 2018-09-12 ENCOUNTER — Encounter: Payer: Self-pay | Admitting: Radiation Oncology

## 2018-09-12 ENCOUNTER — Ambulatory Visit
Admission: RE | Admit: 2018-09-12 | Discharge: 2018-09-12 | Disposition: A | Discharge status: Home / Self Care | Payer: Medicare Other | Source: Ambulatory Visit | Attending: Radiation Oncology | Admitting: Radiation Oncology

## 2018-09-12 ENCOUNTER — Other Ambulatory Visit: Payer: Self-pay

## 2018-09-12 VITALS — BP 132/70 | HR 103 | Temp 98.7°F | Resp 18 | Wt 207.8 lb

## 2018-09-12 DIAGNOSIS — Z87891 Personal history of nicotine dependence: Secondary | ICD-10-CM | POA: Insufficient documentation

## 2018-09-12 DIAGNOSIS — M129 Arthropathy, unspecified: Secondary | ICD-10-CM | POA: Insufficient documentation

## 2018-09-12 DIAGNOSIS — I10 Essential (primary) hypertension: Secondary | ICD-10-CM | POA: Diagnosis not present

## 2018-09-12 DIAGNOSIS — C61 Malignant neoplasm of prostate: Secondary | ICD-10-CM | POA: Insufficient documentation

## 2018-09-12 DIAGNOSIS — G4730 Sleep apnea, unspecified: Secondary | ICD-10-CM | POA: Diagnosis not present

## 2018-09-12 DIAGNOSIS — Z7982 Long term (current) use of aspirin: Secondary | ICD-10-CM | POA: Diagnosis not present

## 2018-09-12 DIAGNOSIS — M5030 Other cervical disc degeneration, unspecified cervical region: Secondary | ICD-10-CM | POA: Diagnosis not present

## 2018-09-12 DIAGNOSIS — Z800 Family history of malignant neoplasm of digestive organs: Secondary | ICD-10-CM | POA: Insufficient documentation

## 2018-09-12 DIAGNOSIS — N529 Male erectile dysfunction, unspecified: Secondary | ICD-10-CM | POA: Insufficient documentation

## 2018-09-12 DIAGNOSIS — Z79899 Other long term (current) drug therapy: Secondary | ICD-10-CM | POA: Diagnosis not present

## 2018-09-12 NOTE — Consult Note (Signed)
NEW PATIENT EVALUATION  Name: Richard Alexander  MRN: [id number]  Date:   09/12/2018     DOB: [birth year]   This 77 y.o. 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  REFERRING PHYSICIAN: Ezequiel Kayser, MD  CHIEF COMPLAINT:  Chief Complaint  Patient presents with  . Prostate Cancer    Pt is here for initial consultation of prostate cancer.     DIAGNOSIS: The encounter diagnosis was Malignant neoplasm of prostate ([place]).   PREVIOUS INVESTIGATIONS:  Stage IIa (T2 1 N0 M0) Gleason 7 (3+4) adenocarcinoma the prostate presenting with a PSA of 5.3  HPI: patient is a 77 year old male whose been followed for a slightly rising PSA and was put on finasteride for that. Even on finasteride continued to decline in June 2019 it was 5.3. On examination he was noted to have a slightly firm nodule in the right lateral lobe. He underwent transrectal ultrasound-guided biopsy positive for Gleason 7 mostly (3+4) in 7 of 12 cores.is gland measured approximately 26 cc. Patient does have erectile dysfunction otherwise has well-controlled urinary symptoms on finasteride and Flomax. Treatment options have been discussed with urology he is now referred to radiation oncology for opinion.  PLANNED TREATMENT REGIMEN: I-125 interstitial implant  PAST MEDICAL HISTORY:  has a past medical history of Allergic rhinitis, Arthritis, DDD (degenerative disc disease), cervical, Hypertension, Presence of permanent cardiac pacemaker, and Sleep apnea.    PAST SURGICAL HISTORY:  Past Surgical History:  Procedure Laterality Date  . adenomatous polyp  06/08/2018  . ANTERIOR CERVICAL DISCECTOMY  ?  Marland Kitchen BACK SURGERY    . COLONOSCOPY WITH PROPOFOL N/A 07/25/2018   Procedure: COLONOSCOPY WITH PROPOFOL;  Surgeon: Manya Silvas, MD;  Location: [facility] ENDOSCOPY;  Service: Endoscopy;  Laterality: N/A;  . HAMMER TOE  SURGERY Right 2007   2nd toe  . INSERT / REPLACE / REMOVE PACEMAKER    . LUMBAR LAMINECTOMY/DECOMPRESSION MICRODISCECTOMY Bilateral 10/28/2016   Procedure: LUMBAR LAMINECTOMY/DECOMPRESSION MICRODISCECTOMY 3 LEVELS;  Surgeon: Meade Maw, MD;  Location: [facility] ORS;  Service: Neurosurgery;  Laterality: Bilateral;  L3-4, L4-5,L5-S1 Laminectomies and bilateral foraminotomies  . PACEMAKER INSERTION  2011    FAMILY HISTORY: family history includes Aortic aneurysm in his mother; Colon cancer in his father; Hypertension in his father; Stroke in his father.  SOCIAL HISTORY:  reports that he quit smoking about 42 years ago. His smoking use included cigarettes. He has never used smokeless tobacco. He reports that he does not drink alcohol or use drugs.  ALLERGIES: Patient has no known allergies.  MEDICATIONS:  Current Outpatient Medications  Medication Sig Dispense Refill  . aspirin EC 81 MG tablet Take 81 mg by mouth daily.    . fexofenadine-pseudoephedrine (ALLEGRA-D 24) 180-240 MG 24 hr tablet Take 1 tablet by mouth daily as needed.    . finasteride (PROSCAR) 5 MG tablet Take 1 tablet (5 mg total) by mouth daily. 90 tablet 3  . hydrochlorothiazide (HYDRODIURIL) 25 MG tablet Take 25 mg by mouth daily.    . polyethylene glycol (MIRALAX / GLYCOLAX) packet Take 17 g by mouth daily as needed.     . potassium chloride SA (K-DUR,KLOR-CON) 20 MEQ tablet Take 20 mEq by mouth daily.     . pravastatin (PRAVACHOL) 20 MG tablet Take 20 mg by mouth daily.    . sildenafil (REVATIO) 20 MG tablet Take by mouth.    Marland Kitchen  tamsulosin (FLOMAX) 0.4 MG CAPS capsule TAKE 1 CAPSULE BY MOUTH ONCE DAILY 90 capsule 3   No current facility-administered medications for this encounter.     ECOG PERFORMANCE STATUS:  0 - Asymptomatic  REVIEW OF SYSTEMS:  Patient denies any weight loss, fatigue, weakness, fever, chills or night sweats. Patient denies any loss of vision, blurred vision. Patient denies any ringing  of the ears  or hearing loss. No irregular heartbeat. Patient denies heart murmur or history of fainting. Patient denies any chest pain or pain radiating to her upper extremities. Patient denies any shortness of breath, difficulty breathing at night, cough or hemoptysis. Patient denies any swelling in the lower legs. Patient denies any nausea vomiting, vomiting of blood, or coffee ground material in the vomitus. Patient denies any stomach pain. Patient states has had normal bowel movements no significant constipation or diarrhea. Patient denies any dysuria, hematuria or significant nocturia. Patient denies any problems walking, swelling in the joints or loss of balance. Patient denies any skin changes, loss of hair or loss of weight. Patient denies any excessive worrying or anxiety or significant depression. Patient denies any problems with insomnia. Patient denies excessive thirst, polyuria, polydipsia. Patient denies any swollen glands, patient denies easy bruising or easy bleeding. Patient denies any recent infections, allergies or URI. Patient "s visual fields have not changed significantly in recent time.    PHYSICAL EXAM: BP 132/70 (BP Location: Left Arm, Patient Position: Sitting)   Pulse (!) 103   Temp 98.7 F (37.1 C) (Tympanic)   Resp 18   Wt 207 lb 12.5 oz (94.3 kg)   BMI 36.81 kg/m  On rectal exam rectal sphincter tone is good prostate has slight nodularity in the right lateral lobe with sulcus preserved. No other prosthetic abnormalities identified No other rectal abnormalities noted.Well-developed well-nourished patient in NAD. HEENT reveals PERLA, EOMI, discs not visualized.  Oral cavity is clear. No oral mucosal lesions are identified. Neck is clear without evidence of cervical or supraclavicular adenopathy. Lungs are clear to A&P. Cardiac examination is essentially unremarkable with regular rate and rhythm without murmur rub or thrill. Abdomen is benign with no organomegaly or masses noted. Motor  sensory and DTR levels are equal and symmetric in the upper and lower extremities. Cranial nerves II through XII are grossly intact. Proprioception is intact. No peripheral adenopathy or edema is identified. No motor or sensory levels are noted. Crude visual fields are within normal range.  LABORATORY DATA: pathology reports reviewed    RADIOLOGY RESULTS:no current films for review   IMPRESSION: stage IIa Gleason 7 (3+4) adenocarcinoma the prostate in 77 year old male  PLAN: at this time I got over treatment recommendations including both external beam image guided radiation therapy as well as I-125 interstitial implant. Risks and benefits of both treatments including increased lower urinary tract symptoms diarrhea fatigue alteration of blood countsand operative risk all were discussed in detail. Patient is favoring I-125 interstitial implant. We will go ahead and proceed with volume studyfollowed by I-125 interstitial implant. Radiation safety precautions risks and benefits of treatment again were all reviewed with the patient and his daughter. They both seem to comprehend my treatment plan well.  I would like to take this opportunity to thank you for allowing me to participate in the care of your patient.Noreene Filbert, MD

## 2018-09-26 ENCOUNTER — Telehealth: Payer: Self-pay | Admitting: Radiology

## 2018-09-26 ENCOUNTER — Other Ambulatory Visit: Payer: Self-pay | Admitting: Radiology

## 2018-09-26 DIAGNOSIS — C61 Malignant neoplasm of prostate: Secondary | ICD-10-CM

## 2018-09-26 NOTE — Telephone Encounter (Signed)
Patient has been scheduled for Prostate Volume Study on 10/10/2018 & seed implants on 11/07/2018 with Dr Erlene Quan.

## 2018-09-30 DIAGNOSIS — C61 Malignant neoplasm of prostate: Secondary | ICD-10-CM | POA: Insufficient documentation

## 2018-10-10 ENCOUNTER — Ambulatory Visit: Admission: RE | Admit: 2018-10-10 | Payer: Medicare Other | Source: Ambulatory Visit | Admitting: Urology

## 2018-10-10 ENCOUNTER — Ambulatory Visit
Admission: RE | Admit: 2018-10-10 | Discharge: 2018-10-10 | Disposition: A | Discharge status: Home / Self Care | Payer: Medicare Other | Source: Ambulatory Visit | Attending: Radiation Oncology | Admitting: Radiation Oncology

## 2018-10-10 ENCOUNTER — Encounter: Payer: Self-pay | Admitting: Radiation Oncology

## 2018-10-10 ENCOUNTER — Other Ambulatory Visit: Payer: Self-pay

## 2018-10-10 VITALS — BP 119/78 | HR 78 | Temp 96.2°F | Resp 18 | Wt 210.5 lb

## 2018-10-10 DIAGNOSIS — C61 Malignant neoplasm of prostate: Secondary | ICD-10-CM

## 2018-10-10 DIAGNOSIS — G4730 Sleep apnea, unspecified: Secondary | ICD-10-CM | POA: Diagnosis not present

## 2018-10-10 DIAGNOSIS — Z87891 Personal history of nicotine dependence: Secondary | ICD-10-CM | POA: Insufficient documentation

## 2018-10-10 DIAGNOSIS — Z800 Family history of malignant neoplasm of digestive organs: Secondary | ICD-10-CM | POA: Diagnosis not present

## 2018-10-10 DIAGNOSIS — Z79899 Other long term (current) drug therapy: Secondary | ICD-10-CM | POA: Diagnosis not present

## 2018-10-10 DIAGNOSIS — I10 Essential (primary) hypertension: Secondary | ICD-10-CM | POA: Insufficient documentation

## 2018-10-10 DIAGNOSIS — M5030 Other cervical disc degeneration, unspecified cervical region: Secondary | ICD-10-CM | POA: Insufficient documentation

## 2018-10-10 DIAGNOSIS — M129 Arthropathy, unspecified: Secondary | ICD-10-CM | POA: Diagnosis not present

## 2018-10-10 DIAGNOSIS — Z7982 Long term (current) use of aspirin: Secondary | ICD-10-CM | POA: Insufficient documentation

## 2018-10-10 SURGERY — ULTRASOUND, PROSTATE, FOR VOLUME DETERMINATION
Anesthesia: Choice

## 2018-10-10 NOTE — Progress Notes (Signed)
Radiation Oncology Follow up Note  Name: Richard Alexander   Date:   09/26/2018 MRN:  [id number] DOB: Nov 12, 1941    This 77 y.o. male presents to the hospital today for volume study in anticipation of I-125 interstitial implant for stage IIa adenocarcinoma the prostate  REFERRING PROVIDER: No ref. provider found  HPI: patient is a 77 year old male seen in the hospital today for history and physical as well as life study in anticipation ofI-125 interstitial implant for stage IIa (T2 1 N0 M0) adenocarcinoma prostate Gleason score of 7 (3+4) presenting with a PSA of 5.3  COMPLICATIONS OF TREATMENT: none  FOLLOW UP COMPLIANCE: keeps appointments   PHYSICAL EXAM:  There were no vitals taken for this visit. Well-developed well-nourished patient in NAD. HEENT reveals PERLA, EOMI, discs not visualized.  Oral cavity is clear. No oral mucosal lesions are identified. Neck is clear without evidence of cervical or supraclavicular adenopathy. Lungs are clear to A&P. Cardiac examination is essentially unremarkable with regular rate and rhythm without murmur rub or thrill. Abdomen is benign with no organomegaly or masses noted. Motor sensory and DTR levels are equal and symmetric in the upper and lower extremities. Cranial nerves II through XII are grossly intact. Proprioception is intact. No peripheral adenopathy or edema is identified. No motor or sensory levels are noted. Crude visual fields are within normal range.  RADIOLOGY RESULTS: ultrasound used for volume study  PLAN: Patient was taken to the cystoscopy suite in the OR. Patient was placed in the low lithotomy position. Foley catheter was placed. Trans-rectal ultrasound probe was inserted into the rectum and prostate seminal vesicles were visualized as well as bladder base. stepping images were performed on a 5 mm increments. Images will be placed in [facility] treatment planning system to determine seed placement coordinates for eventual I-125  interstitial implant. Images will be reviewed with the physics and dosimetry staff for final quality approval. I personally was present for the volume study and assisted in delineation of contour volumes.  At the end of the procedure Foley catheter was removed, rectal ultrasound probe was removed. Patient tolerated his procedures extremely well with no side effects or complaints. Patient has given appointment for interstitial implant date. Consent was signed today as well as history and physical performed in preparation for his outpatient surgical implant.      Noreene Filbert, MD

## 2018-10-10 NOTE — H&P (Signed)
NEW PATIENT EVALUATION  Name: Richard Alexander  MRN: [id number]  Date:   10/10/2018     DOB: 26-Jul-1941   This 77 y.o. male patient presents to the clinic forHistory and physical prior to I-125 interstitial implant  REFERRING PHYSICIAN: Ezequiel Kayser, MD  CHIEF COMPLAINT:  Chief Complaint  Patient presents with  . Prostate Cancer    Pt is here for post volume study    DIAGNOSIS: The encounter diagnosis was Malignant neoplasm of prostate ([place]).   PREVIOUS INVESTIGATIONS:  Previous notes reviewed  HPI: patient is a 77 year old male presented with a slight rise in his PSA on finasteride underwent transrectal ultrasound-guided biopsy positive for mostly Gleason 7 (3+4) in 712 cores for 26 cc gland. He was recommended for I-125 interstitial implant seen today for volume study and history and physical prior study is doing well continues to have some increased lower urinary tract symptoms although currently is on finasteride finasteride as well as Flomax.  PLANNED TREATMENT REGIMEN: I-125 interstitial implant  PAST MEDICAL HISTORY:  has a past medical history of Allergic rhinitis, Arthritis, DDD (degenerative disc disease), cervical, Hypertension, Presence of permanent cardiac pacemaker, and Sleep apnea.    PAST SURGICAL HISTORY:  Past Surgical History:  Procedure Laterality Date  . adenomatous polyp  06/08/2018  . ANTERIOR CERVICAL DISCECTOMY  ?  Marland Kitchen BACK SURGERY    . COLONOSCOPY WITH PROPOFOL N/A 07/25/2018   Procedure: COLONOSCOPY WITH PROPOFOL;  Surgeon: Manya Silvas, MD;  [facility] ENDOSCOPY;  Service: Endoscopy;  Laterality: N/A;  . HAMMER TOE SURGERY Right 2007   2nd toe  . INSERT / REPLACE / REMOVE PACEMAKER    . LUMBAR LAMINECTOMY/DECOMPRESSION MICRODISCECTOMY Bilateral 10/28/2016   Procedure: LUMBAR LAMINECTOMY/DECOMPRESSION MICRODISCECTOMY 3 LEVELS;  Surgeon: Meade Maw, MD;  Location: [facility] ORS;  Service: Neurosurgery;  Laterality: Bilateral;  L3-4, L4-5,L5-S1  Laminectomies and bilateral foraminotomies  . PACEMAKER INSERTION  2011    FAMILY HISTORY: family history includes Aortic aneurysm in his mother; Colon cancer in his father; Hypertension in his father; Stroke in his father.  SOCIAL HISTORY:  reports that he quit smoking about 43 years ago. His smoking use included cigarettes. He has never used smokeless tobacco. He reports that he does not drink alcohol or use drugs.  ALLERGIES: Patient has no known allergies.  MEDICATIONS:  Current Outpatient Medications  Medication Sig Dispense Refill  . acetaminophen (TYLENOL) 500 MG tablet Take 1,000 mg by mouth every 6 (six) hours as needed (for pain.).    Marland Kitchen aspirin EC 81 MG tablet Take 81 mg by mouth daily.    Marland Kitchen azaTHIOprine (IMURAN) 50 MG tablet Take 50 mg by mouth daily as needed (for rash related to heat/sun exposure (Summer Months Only)).    Marland Kitchen docusate sodium (COLACE) 100 MG capsule Take 100 mg by mouth daily.    . Emollient (CERAVE EX) Apply 1 application topically 3 (three) times daily as needed (for skin irritation).    . fexofenadine-pseudoephedrine (ALLEGRA-D 24) 180-240 MG 24 hr tablet Take 1 tablet by mouth daily as needed (allergy).     . finasteride (PROSCAR) 5 MG tablet Take 1 tablet (5 mg total) by mouth daily. 90 tablet 3  . fluocinonide cream (LIDEX) 5.28 % Apply 1 application topically 2 (two) times daily as needed (for skin irritation).    . hydrochlorothiazide (HYDRODIURIL) 25 MG tablet Take 25 mg by mouth daily.    . hydrocortisone 2.5 % cream Apply 1 application topically 2 (two) times daily as needed (  for skin irritation).    . polyethylene glycol (MIRALAX / GLYCOLAX) packet Take 17 g by mouth daily as needed (constipation.).     Marland Kitchen potassium chloride SA (K-DUR,KLOR-CON) 20 MEQ tablet Take 20 mEq by mouth daily.     . pravastatin (PRAVACHOL) 20 MG tablet Take 20 mg by mouth every evening.     . sildenafil (REVATIO) 20 MG tablet Take 40-100 mg by mouth daily as needed (erectile  dysfunction.).     Marland Kitchen tamsulosin (FLOMAX) 0.4 MG CAPS capsule TAKE 1 CAPSULE BY MOUTH ONCE DAILY (Patient taking differently: Take 0.4 mg by mouth daily after breakfast. ) 90 capsule 3  . vitamin B-12 (CYANOCOBALAMIN) 1000 MCG tablet Take 1,000 mcg by mouth daily.     No current facility-administered medications for this encounter.     ECOG PERFORMANCE STATUS:  0 - Asymptomatic  REVIEW OF SYSTEMS:  Patient denies any weight loss, fatigue, weakness, fever, chills or night sweats. Patient denies any loss of vision, blurred vision. Patient denies any ringing  of the ears or hearing loss. No irregular heartbeat. Patient denies heart murmur or history of fainting. Patient denies any chest pain or pain radiating to her upper extremities. Patient denies any shortness of breath, difficulty breathing at night, cough or hemoptysis. Patient denies any swelling in the lower legs. Patient denies any nausea vomiting, vomiting of blood, or coffee ground material in the vomitus. Patient denies any stomach pain. Patient states has had normal bowel movements no significant constipation or diarrhea. Patient denies any dysuria, hematuria or significant nocturia. Patient denies any problems walking, swelling in the joints or loss of balance. Patient denies any skin changes, loss of hair or loss of weight. Patient denies any excessive worrying or anxiety or significant depression. Patient denies any problems with insomnia. Patient denies excessive thirst, polyuria, polydipsia. Patient denies any swollen glands, patient denies easy bruising or easy bleeding. Patient denies any recent infections, allergies or URI. Patient "s visual fields have not changed significantly in recent time.    PHYSICAL EXAM: BP 119/78 (BP Location: Left Arm, Patient Position: Sitting)   Pulse 78   Temp (!) 96.2 F (35.7 C) (Tympanic)   Resp 18   Wt 210 lb 8.6 oz (95.5 kg)   BMI 37.30 kg/m  Well-developed well-nourished patient in NAD. HEENT  reveals PERLA, EOMI, discs not visualized.  Oral cavity is clear. No oral mucosal lesions are identified. Neck is clear without evidence of cervical or supraclavicular adenopathy. Lungs are clear to A&P. Cardiac examination is essentially unremarkable with regular rate and rhythm without murmur rub or thrill. Abdomen is benign with no organomegaly or masses noted. Motor sensory and DTR levels are equal and symmetric in the upper and lower extremities. Cranial nerves II through XII are grossly intact. Proprioception is intact. No peripheral adenopathy or edema is identified. No motor or sensory levels are noted. Crude visual fields are within normal range.  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 Risks and benefits of treatment including increased lower urinary tract symptoms possible diarrhea fatigue all were discussed in detail we also went over radiation safety precautions for the 2 months he is will be radioactive from his implant. Patient, cancer treatment plan well. Volume study was successfully performed we'll now do treatment  planning to determine source placement in his gland.  I would like to take this opportunity to thank you for allowing me to participate in the care of your patient.Noreene Filbert, MD

## 2018-10-10 NOTE — H&P (View-Only) (Signed)
NEW PATIENT EVALUATION  Name: Richard Alexander  MRN: [id number]  Date:   10/10/2018     DOB: 1941/06/30   This 77 y.o. male patient presents to the clinic forHistory and physical prior to I-125 interstitial implant  REFERRING PHYSICIAN: Ezequiel Kayser, MD  CHIEF COMPLAINT:  Chief Complaint  Patient presents with  . Prostate Cancer    Pt is here for post volume study    DIAGNOSIS: The encounter diagnosis was Malignant neoplasm of prostate ([place]).   PREVIOUS INVESTIGATIONS:  Previous notes reviewed  HPI: patient is a 77 year old male presented with a slight rise in his PSA on finasteride underwent transrectal ultrasound-guided biopsy positive for mostly Gleason 7 (3+4) in 712 cores for 26 cc gland. He was recommended for I-125 interstitial implant seen today for volume study and history and physical prior study is doing well continues to have some increased lower urinary tract symptoms although currently is on finasteride finasteride as well as Flomax.  PLANNED TREATMENT REGIMEN: I-125 interstitial implant  PAST MEDICAL HISTORY:  has a past medical history of Allergic rhinitis, Arthritis, DDD (degenerative disc disease), cervical, Hypertension, Presence of permanent cardiac pacemaker, and Sleep apnea.    PAST SURGICAL HISTORY:  Past Surgical History:  Procedure Laterality Date  . adenomatous polyp  06/08/2018  . ANTERIOR CERVICAL DISCECTOMY  ?  Marland Kitchen BACK SURGERY    . COLONOSCOPY WITH PROPOFOL N/A 07/25/2018   Procedure: COLONOSCOPY WITH PROPOFOL;  Surgeon: Manya Silvas, MD;  Location: [facility] ENDOSCOPY;  Service: Endoscopy;  Laterality: N/A;  . HAMMER TOE SURGERY Right 2007   2nd toe  . INSERT / REPLACE / REMOVE PACEMAKER    . LUMBAR LAMINECTOMY/DECOMPRESSION MICRODISCECTOMY Bilateral 10/28/2016   Procedure: LUMBAR LAMINECTOMY/DECOMPRESSION MICRODISCECTOMY 3 LEVELS;  Surgeon: Meade Maw, MD;  Location: [facility] ORS;  Service: Neurosurgery;  Laterality: Bilateral;  L3-4, L4-5,L5-S1  Laminectomies and bilateral foraminotomies  . PACEMAKER INSERTION  2011    FAMILY HISTORY: family history includes Aortic aneurysm in his mother; Colon cancer in his father; Hypertension in his father; Stroke in his father.  SOCIAL HISTORY:  reports that he quit smoking about 43 years ago. His smoking use included cigarettes. He has never used smokeless tobacco. He reports that he does not drink alcohol or use drugs.  ALLERGIES: Patient has no known allergies.  MEDICATIONS:  Current Outpatient Medications  Medication Sig Dispense Refill  . acetaminophen (TYLENOL) 500 MG tablet Take 1,000 mg by mouth every 6 (six) hours as needed (for pain.).    Marland Kitchen aspirin EC 81 MG tablet Take 81 mg by mouth daily.    Marland Kitchen azaTHIOprine (IMURAN) 50 MG tablet Take 50 mg by mouth daily as needed (for rash related to heat/sun exposure (Summer Months Only)).    Marland Kitchen docusate sodium (COLACE) 100 MG capsule Take 100 mg by mouth daily.    . Emollient (CERAVE EX) Apply 1 application topically 3 (three) times daily as needed (for skin irritation).    . fexofenadine-pseudoephedrine (ALLEGRA-D 24) 180-240 MG 24 hr tablet Take 1 tablet by mouth daily as needed (allergy).     . finasteride (PROSCAR) 5 MG tablet Take 1 tablet (5 mg total) by mouth daily. 90 tablet 3  . fluocinonide cream (LIDEX) 6.81 % Apply 1 application topically 2 (two) times daily as needed (for skin irritation).    . hydrochlorothiazide (HYDRODIURIL) 25 MG tablet Take 25 mg by mouth daily.    . hydrocortisone 2.5 % cream Apply 1 application topically 2 (two) times daily as needed (  for skin irritation).    . polyethylene glycol (MIRALAX / GLYCOLAX) packet Take 17 g by mouth daily as needed (constipation.).     Marland Kitchen potassium chloride SA (K-DUR,KLOR-CON) 20 MEQ tablet Take 20 mEq by mouth daily.     . pravastatin (PRAVACHOL) 20 MG tablet Take 20 mg by mouth every evening.     . sildenafil (REVATIO) 20 MG tablet Take 40-100 mg by mouth daily as needed (erectile  dysfunction.).     Marland Kitchen tamsulosin (FLOMAX) 0.4 MG CAPS capsule TAKE 1 CAPSULE BY MOUTH ONCE DAILY (Patient taking differently: Take 0.4 mg by mouth daily after breakfast. ) 90 capsule 3  . vitamin B-12 (CYANOCOBALAMIN) 1000 MCG tablet Take 1,000 mcg by mouth daily.     No current facility-administered medications for this encounter.     ECOG PERFORMANCE STATUS:  0 - Asymptomatic  REVIEW OF SYSTEMS:  Patient denies any weight loss, fatigue, weakness, fever, chills or night sweats. Patient denies any loss of vision, blurred vision. Patient denies any ringing  of the ears or hearing loss. No irregular heartbeat. Patient denies heart murmur or history of fainting. Patient denies any chest pain or pain radiating to her upper extremities. Patient denies any shortness of breath, difficulty breathing at night, cough or hemoptysis. Patient denies any swelling in the lower legs. Patient denies any nausea vomiting, vomiting of blood, or coffee ground material in the vomitus. Patient denies any stomach pain. Patient states has had normal bowel movements no significant constipation or diarrhea. Patient denies any dysuria, hematuria or significant nocturia. Patient denies any problems walking, swelling in the joints or loss of balance. Patient denies any skin changes, loss of hair or loss of weight. Patient denies any excessive worrying or anxiety or significant depression. Patient denies any problems with insomnia. Patient denies excessive thirst, polyuria, polydipsia. Patient denies any swollen glands, patient denies easy bruising or easy bleeding. Patient denies any recent infections, allergies or URI. Patient "s visual fields have not changed significantly in recent time.    PHYSICAL EXAM: BP 119/78 (BP Location: Left Arm, Patient Position: Sitting)   Pulse 78   Temp (!) 96.2 F (35.7 C) (Tympanic)   Resp 18   Wt 210 lb 8.6 oz (95.5 kg)   BMI 37.30 kg/m  Well-developed well-nourished patient in NAD. HEENT  reveals PERLA, EOMI, discs not visualized.  Oral cavity is clear. No oral mucosal lesions are identified. Neck is clear without evidence of cervical or supraclavicular adenopathy. Lungs are clear to A&P. Cardiac examination is essentially unremarkable with regular rate and rhythm without murmur rub or thrill. Abdomen is benign with no organomegaly or masses noted. Motor sensory and DTR levels are equal and symmetric in the upper and lower extremities. Cranial nerves II through XII are grossly intact. Proprioception is intact. No peripheral adenopathy or edema is identified. No motor or sensory levels are noted. Crude visual fields are within normal range.  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 Risks and benefits of treatment including increased lower urinary tract symptoms possible diarrhea fatigue all were discussed in detail we also went over radiation safety precautions for the 2 months he is will be radioactive from his implant. Patient, cancer treatment plan well. Volume study was successfully performed we'll now do treatment  planning to determine source placement in his gland.  I would like to take this opportunity to thank you for allowing me to participate in the care of your patient.Noreene Filbert, MD

## 2018-10-31 ENCOUNTER — Ambulatory Visit
Admission: RE | Admit: 2018-10-31 | Discharge: 2018-10-31 | Disposition: A | Discharge status: Home / Self Care | Payer: Medicare Other | Source: Ambulatory Visit | Attending: Urology | Admitting: Urology

## 2018-10-31 ENCOUNTER — Other Ambulatory Visit: Payer: Self-pay

## 2018-10-31 ENCOUNTER — Encounter
Admission: RE | Admit: 2018-10-31 | Discharge: 2018-10-31 | Disposition: A | Discharge status: Home / Self Care | Payer: Medicare Other | Source: Ambulatory Visit | Attending: Urology | Admitting: Urology

## 2018-10-31 ENCOUNTER — Telehealth: Payer: Self-pay | Admitting: Urology

## 2018-10-31 DIAGNOSIS — Z950 Presence of cardiac pacemaker: Secondary | ICD-10-CM

## 2018-10-31 DIAGNOSIS — R9389 Abnormal findings on diagnostic imaging of other specified body structures: Secondary | ICD-10-CM

## 2018-10-31 DIAGNOSIS — Z01818 Encounter for other preprocedural examination: Secondary | ICD-10-CM | POA: Insufficient documentation

## 2018-10-31 LAB — CBC
HEMATOCRIT: 41.2 % (ref 39.0–52.0)
Hemoglobin: 13.3 g/dL (ref 13.0–17.0)
MCH: 30.4 pg (ref 26.0–34.0)
MCHC: 32.3 g/dL (ref 30.0–36.0)
MCV: 94.3 fL (ref 80.0–100.0)
NRBC: 0 % (ref 0.0–0.2)
PLATELETS: 347 10*3/uL (ref 150–400)
RBC: 4.37 MIL/uL (ref 4.22–5.81)
RDW: 14.6 % (ref 11.5–15.5)
WBC: 8.2 10*3/uL (ref 4.0–10.5)

## 2018-10-31 NOTE — Patient Instructions (Signed)
Your procedure is scheduled on: Monday 11/07/18 Report to [facility]. To find out your arrival time please call [phone] between 1PM - 3PM on Friday 11/04/18.  Remember: Instructions that are not followed completely may result in serious medical risk, up to and including death, or upon the discretion of your surgeon and anesthesiologist your surgery may need to be rescheduled.     _X__ 1. Do not eat food after midnight the night before your procedure.                 No gum chewing or hard candies. You may drink clear liquids up to 2 hours                 before you are scheduled to arrive for your surgery- DO not drink clear                 liquids within 2 hours of the start of your surgery.                 Clear Liquids include:  water, apple juice without pulp, clear carbohydrate                 drink such as Clearfast or Gatorade, Black Coffee or Tea (Do not add                 anything to coffee or tea).  __X__2.  On the morning of surgery brush your teeth with toothpaste and water, you                 may rinse your mouth with mouthwash if you wish.  Do not swallow any              toothpaste of mouthwash.     _X__ 3.  No Alcohol for 24 hours before or after surgery.   _X__ 4.  Do Not Smoke or use e-cigarettes For 24 Hours Prior to Your Surgery.                 Do not use any chewable tobacco products for at least 6 hours prior to                 surgery.  ____  5.  Bring all medications with you on the day of surgery if instructed.   __X__  6.  Notify your doctor if there is any change in your medical condition      (cold, fever, infections).     Do not wear jewelry, make-up, hairpins, clips or nail polish. Do not wear lotions, powders, or perfumes.  Do not shave 48 hours prior to surgery. Men may shave face and neck. Do not bring valuables to the hospital.    [facility] is not responsible for any belongings or  valuables.  Contacts, dentures/partials or body piercings may not be worn into surgery. Bring a case for your contacts, glasses or hearing aids, a denture cup will be supplied. Leave your suitcase in the car. After surgery it may be brought to your room. For patients admitted to the hospital, discharge time is determined by your treatment team.   Patients discharged the day of surgery will not be allowed to drive home.   Please read over the following fact sheets that you were given:   MRSA Information  __X__ Take these medicines the morning of surgery with A SIP OF WATER:  1. finasteride (PROSCAR)  2. tamsulosin (FLOMAX)  3.   4.  5.  6.  ____ Fleet Enema (as directed)   __X__ Use CHG Soap/SAGE wipes as directed  ____ Use inhalers on the day of surgery  ____ Stop metformin/Janumet/Farxiga 2 days prior to surgery    ____ Take 1/2 of usual insulin dose the night before surgery. No insulin the morning          of surgery.   __X__ Stop Blood Thinners Coumadin/Plavix/Xarelto/Pleta/Pradaxa/Eliquis/Effient/Aspirin  on   Or contact your Surgeon, Cardiologist or Medical Doctor regarding  ability to stop your blood thinners  __X__ Stop Anti-inflammatories 7 days before surgery such as Advil, Ibuprofen, Motrin,  BC or Goodies Powder, Naprosyn, Naproxen, Aleve, Aspirin    __X__ Stopall herbal supplements, fish oil or vitamin E until after surgery.    ____ Bring C-Pap to the hospital.

## 2018-10-31 NOTE — Pre-Procedure Instructions (Signed)
EKG SHEET FAXED TO DR Ubaldo Glassing

## 2018-10-31 NOTE — Telephone Encounter (Signed)
A chest x-ray was ordered by the preop testing for this patient.  It showed some incidental findings of the base of the lungs which are not completely evaluated on chest x-ray.   Is he having any pulmonary/respiratory symptoms like a cough or cold?  If not, a chest CT was recommended to evaluate this area more clearly.  I will go ahead and place the order today.  I will follow-up with him regarding the results as soon as I get them.  Hollice Espy, MD

## 2018-11-01 DIAGNOSIS — M19011 Primary osteoarthritis, right shoulder: Secondary | ICD-10-CM | POA: Insufficient documentation

## 2018-11-01 NOTE — Pre-Procedure Instructions (Signed)
CXR RESULTS FAXED TO DR Erlene Quan

## 2018-11-01 NOTE — Telephone Encounter (Signed)
Patient notified and is not having any cough or cold symptoms. He is aware that Imaging department will be in contact to schedule a CT to look at the lungs.

## 2018-11-01 NOTE — Pre-Procedure Instructions (Signed)
CARDIAC PROGRAMMING SHEET ON CHART FROM DR [facility]

## 2018-11-06 MED ORDER — CIPROFLOXACIN IN D5W 400 MG/200ML IV SOLN
400.0000 mg | INTRAVENOUS | Status: AC
  Administered 2018-11-07: 400 mg via INTRAVENOUS

## 2018-11-07 ENCOUNTER — Ambulatory Visit: Payer: Medicare Other | Admitting: Certified Registered"

## 2018-11-07 ENCOUNTER — Ambulatory Visit
Admission: RE | Admit: 2018-11-07 | Discharge: 2018-11-07 | Disposition: A | Discharge status: Home / Self Care | Payer: Medicare Other | Source: Ambulatory Visit | Attending: Radiation Oncology | Admitting: Radiation Oncology

## 2018-11-07 ENCOUNTER — Ambulatory Visit
Admission: RE | Admit: 2018-11-07 | Discharge: 2018-11-07 | Disposition: A | Discharge status: Home / Self Care | Payer: Medicare Other | Source: Ambulatory Visit | Attending: Urology | Admitting: Urology

## 2018-11-07 ENCOUNTER — Other Ambulatory Visit: Payer: Self-pay

## 2018-11-07 ENCOUNTER — Encounter
Admission: RE | Disposition: A | Discharge status: Home / Self Care | Payer: Self-pay | Source: Ambulatory Visit | Attending: Urology

## 2018-11-07 ENCOUNTER — Encounter: Payer: Self-pay | Admitting: *Deleted

## 2018-11-07 DIAGNOSIS — I10 Essential (primary) hypertension: Secondary | ICD-10-CM | POA: Diagnosis not present

## 2018-11-07 DIAGNOSIS — Z7982 Long term (current) use of aspirin: Secondary | ICD-10-CM | POA: Insufficient documentation

## 2018-11-07 DIAGNOSIS — C61 Malignant neoplasm of prostate: Secondary | ICD-10-CM | POA: Insufficient documentation

## 2018-11-07 DIAGNOSIS — Z79899 Other long term (current) drug therapy: Secondary | ICD-10-CM | POA: Insufficient documentation

## 2018-11-07 DIAGNOSIS — Z87891 Personal history of nicotine dependence: Secondary | ICD-10-CM | POA: Diagnosis not present

## 2018-11-07 HISTORY — PX: RADIOACTIVE SEED IMPLANT: SHX5150

## 2018-11-07 SURGERY — INSERTION, RADIATION SOURCE, PROSTATE
Anesthesia: General

## 2018-11-07 MED ORDER — PROPOFOL 10 MG/ML IV BOLUS
INTRAVENOUS | Status: AC
  Filled 2018-11-07: qty 20

## 2018-11-07 MED ORDER — FAMOTIDINE 20 MG PO TABS
ORAL_TABLET | ORAL | Status: AC
  Filled 2018-11-07: qty 1

## 2018-11-07 MED ORDER — FENTANYL CITRATE (PF) 100 MCG/2ML IJ SOLN
INTRAMUSCULAR | Status: DC | PRN
  Administered 2018-11-07: 50 ug via INTRAVENOUS

## 2018-11-07 MED ORDER — HYDROCODONE-ACETAMINOPHEN 5-325 MG PO TABS: 1.0000 | ORAL_TABLET | Freq: Four times a day (QID) | ORAL | 0 refills | Status: DC | PRN

## 2018-11-07 MED ORDER — CIPROFLOXACIN HCL 500 MG PO TABS: 500.0000 mg | ORAL_TABLET | Freq: Once | ORAL | 0 refills | Status: AC

## 2018-11-07 MED ORDER — LIDOCAINE HCL (PF) 2 % IJ SOLN
INTRAMUSCULAR | Status: AC
  Filled 2018-11-07: qty 10

## 2018-11-07 MED ORDER — FLEET ENEMA 7-19 GM/118ML RE ENEM
1.0000 | ENEMA | Freq: Once | RECTAL | Status: AC
  Administered 2018-11-07: 1 via RECTAL

## 2018-11-07 MED ORDER — FENTANYL CITRATE (PF) 100 MCG/2ML IJ SOLN
INTRAMUSCULAR | Status: AC
  Filled 2018-11-07: qty 2

## 2018-11-07 MED ORDER — DEXAMETHASONE SODIUM PHOSPHATE 10 MG/ML IJ SOLN
INTRAMUSCULAR | Status: AC
  Filled 2018-11-07: qty 1

## 2018-11-07 MED ORDER — PROPOFOL 10 MG/ML IV BOLUS
INTRAVENOUS | Status: DC | PRN
  Administered 2018-11-07: 160 mg via INTRAVENOUS

## 2018-11-07 MED ORDER — KETOROLAC TROMETHAMINE 30 MG/ML IJ SOLN
INTRAMUSCULAR | Status: AC
  Filled 2018-11-07: qty 1

## 2018-11-07 MED ORDER — EPHEDRINE SULFATE 50 MG/ML IJ SOLN
INTRAMUSCULAR | Status: DC | PRN
  Administered 2018-11-07: 10 mg via INTRAVENOUS

## 2018-11-07 MED ORDER — LIDOCAINE HCL (CARDIAC) PF 100 MG/5ML IV SOSY
PREFILLED_SYRINGE | INTRAVENOUS | Status: DC | PRN
  Administered 2018-11-07: 100 mg via INTRAVENOUS

## 2018-11-07 MED ORDER — LACTATED RINGERS IV SOLN
INTRAVENOUS | Status: DC
  Administered 2018-11-07: 08:00:00 via INTRAVENOUS

## 2018-11-07 MED ORDER — ROCURONIUM BROMIDE 50 MG/5ML IV SOLN
INTRAVENOUS | Status: AC
  Filled 2018-11-07: qty 1

## 2018-11-07 MED ORDER — SUGAMMADEX SODIUM 200 MG/2ML IV SOLN
INTRAVENOUS | Status: DC | PRN
  Administered 2018-11-07: 200 mg via INTRAVENOUS

## 2018-11-07 MED ORDER — ONDANSETRON HCL 4 MG/2ML IJ SOLN
INTRAMUSCULAR | Status: AC
  Filled 2018-11-07: qty 2

## 2018-11-07 MED ORDER — DOCUSATE SODIUM 100 MG PO CAPS: 100.0000 mg | ORAL_CAPSULE | Freq: Two times a day (BID) | ORAL | 0 refills | Status: DC

## 2018-11-07 MED ORDER — OXYCODONE HCL 5 MG PO TABS: 5.0000 mg | ORAL_TABLET | Freq: Once | ORAL | Status: DC | PRN

## 2018-11-07 MED ORDER — ONDANSETRON HCL 4 MG/2ML IJ SOLN
INTRAMUSCULAR | Status: DC | PRN
  Administered 2018-11-07: 4 mg via INTRAVENOUS

## 2018-11-07 MED ORDER — SUCCINYLCHOLINE CHLORIDE 20 MG/ML IJ SOLN
INTRAMUSCULAR | Status: DC | PRN
  Administered 2018-11-07: 200 mg via INTRAVENOUS

## 2018-11-07 MED ORDER — ROCURONIUM BROMIDE 100 MG/10ML IV SOLN
INTRAVENOUS | Status: DC | PRN
  Administered 2018-11-07: 20 mg via INTRAVENOUS

## 2018-11-07 MED ORDER — FENTANYL CITRATE (PF) 100 MCG/2ML IJ SOLN: 25.0000 ug | INTRAMUSCULAR | Status: DC | PRN

## 2018-11-07 MED ORDER — BACITRACIN 500 UNIT/GM EX OINT
TOPICAL_OINTMENT | CUTANEOUS | Status: DC | PRN
  Administered 2018-11-07: 1 via TOPICAL

## 2018-11-07 MED ORDER — FAMOTIDINE 20 MG PO TABS
20.0000 mg | ORAL_TABLET | Freq: Once | ORAL | Status: AC
  Administered 2018-11-07: 20 mg via ORAL

## 2018-11-07 MED ORDER — DEXAMETHASONE SODIUM PHOSPHATE 10 MG/ML IJ SOLN
INTRAMUSCULAR | Status: DC | PRN
  Administered 2018-11-07: 10 mg via INTRAVENOUS

## 2018-11-07 MED ORDER — CIPROFLOXACIN IN D5W 400 MG/200ML IV SOLN
INTRAVENOUS | Status: AC
  Filled 2018-11-07: qty 200

## 2018-11-07 MED ORDER — KETOROLAC TROMETHAMINE 30 MG/ML IJ SOLN
INTRAMUSCULAR | Status: DC | PRN
  Administered 2018-11-07: 30 mg via INTRAVENOUS

## 2018-11-07 MED ORDER — BACITRACIN ZINC 500 UNIT/GM EX OINT
TOPICAL_OINTMENT | CUTANEOUS | Status: AC
  Filled 2018-11-07: qty 28.35

## 2018-11-07 MED ORDER — OXYCODONE HCL 5 MG/5ML PO SOLN: 5.0000 mg | Freq: Once | ORAL | Status: DC | PRN

## 2018-11-07 SURGICAL SUPPLY — 28 items
BAG URINE DRAINAGE (UROLOGICAL SUPPLIES) ×3 IMPLANT
BLADE CLIPPER SURG (BLADE) ×3 IMPLANT
CATH FOL 2WAY LX 16X5 (CATHETERS) ×3 IMPLANT
COVER WAND RF STERILE (DRAPES) ×3 IMPLANT
DRAPE INCISE 23X17 IOBAN STRL (DRAPES) ×2
DRAPE INCISE 23X17 STRL (DRAPES) ×1 IMPLANT
DRAPE INCISE IOBAN 23X17 STRL (DRAPES) ×1 IMPLANT
DRAPE SHEET LG 3/4 BI-LAMINATE (DRAPES) ×3 IMPLANT
DRAPE TABLE BACK 80X90 (DRAPES) ×3 IMPLANT
DRAPE UNDER BUTTOCK W/FLU (DRAPES) ×3 IMPLANT
DRSG TELFA 3X8 NADH (GAUZE/BANDAGES/DRESSINGS) ×3 IMPLANT
GLOVE BIO SURGEON STRL SZ 6.5 (GLOVE) ×2 IMPLANT
GLOVE BIO SURGEON STRL SZ7.5 (GLOVE) ×6 IMPLANT
GLOVE BIO SURGEONS STRL SZ 6.5 (GLOVE) ×1
GOWN STRL REUS W/ TWL LRG LVL3 (GOWN DISPOSABLE) ×2 IMPLANT
GOWN STRL REUS W/ TWL XL LVL3 (GOWN DISPOSABLE) ×1 IMPLANT
GOWN STRL REUS W/TWL LRG LVL3 (GOWN DISPOSABLE) ×4
GOWN STRL REUS W/TWL XL LVL3 (GOWN DISPOSABLE) ×2
IV NS 1000ML (IV SOLUTION) ×2
IV NS 1000ML BAXH (IV SOLUTION) ×1 IMPLANT
KIT TURNOVER CYSTO (KITS) ×3 IMPLANT
PACK CYSTO AR (MISCELLANEOUS) ×3 IMPLANT
PAD DRESSING TELFA 3X8 NADH (GAUZE/BANDAGES/DRESSINGS) ×1 IMPLANT
SET CYSTO W/LG BORE CLAMP LF (SET/KITS/TRAYS/PACK) ×3 IMPLANT
SURGILUBE 2OZ TUBE FLIPTOP (MISCELLANEOUS) ×3 IMPLANT
SYR 10ML LL (SYRINGE) ×3 IMPLANT
SYRINGE IRR TOOMEY STRL 70CC (SYRINGE) IMPLANT
WATER STERILE IRR 1000ML POUR (IV SOLUTION) ×3 IMPLANT

## 2018-11-07 NOTE — Anesthesia Preprocedure Evaluation (Signed)
Anesthesia Evaluation  Patient identified by MRN, date of birth, ID band Patient awake    Reviewed: Allergy & Precautions, H&P , NPO status , Patient's Chart, lab work & pertinent test results  History of Anesthesia Complications Negative for: history of anesthetic complications  Airway Mallampati: II  TM Distance: >3 FB Neck ROM: full    Dental  (+) Chipped, Poor Dentition, Missing   Pulmonary neg shortness of breath, sleep apnea , former smoker,           Cardiovascular Exercise Tolerance: Good hypertension, (-) angina(-) Past MI and (-) DOE + pacemaker      Neuro/Psych  Neuromuscular disease negative psych ROS   GI/Hepatic negative GI ROS, Neg liver ROS, neg GERD  ,  Endo/Other  negative endocrine ROS  Renal/GU      Musculoskeletal  (+) Arthritis ,   Abdominal   Peds  Hematology negative hematology ROS (+)   Anesthesia Other Findings Past Medical History: No date: Allergic rhinitis No date: Arthritis No date: DDD (degenerative disc disease), cervical No date: Hypertension No date: Presence of permanent cardiac pacemaker No date: Sleep apnea     Comment:  CPAP  Past Surgical History: 06/08/2018: adenomatous polyp ?: ANTERIOR CERVICAL DISCECTOMY No date: BACK SURGERY 07/25/2018: COLONOSCOPY WITH PROPOFOL; N/A     Comment:  Procedure: COLONOSCOPY WITH PROPOFOL;  Surgeon: Manya Silvas, MD;  Location: [facility] ENDOSCOPY;  Service:               Endoscopy;  Laterality: N/A; 2007: HAMMER TOE SURGERY; Right     Comment:  2nd toe No date: INSERT / REPLACE / REMOVE PACEMAKER 10/28/2016: LUMBAR LAMINECTOMY/DECOMPRESSION MICRODISCECTOMY; Bilateral     Comment:  Procedure: LUMBAR LAMINECTOMY/DECOMPRESSION               MICRODISCECTOMY 3 LEVELS;  Surgeon: Meade Maw,               MD;  Location: [facility] ORS;  Service: Neurosurgery;                Laterality: Bilateral;  L3-4, L4-5,L5-S1  Laminectomies               and bilateral foraminotomies 2011: PACEMAKER INSERTION  BMI    Body Mass Index:  37.38 kg/m      Reproductive/Obstetrics negative OB ROS                             Anesthesia Physical Anesthesia Plan  ASA: IV  Anesthesia Plan: General ETT   Post-op Pain Management:    Induction: Intravenous  PONV Risk Score and Plan: Ondansetron, Dexamethasone, Midazolam and Treatment may vary due to age or medical condition  Airway Management Planned: Oral ETT  Additional Equipment:   Intra-op Plan:   Post-operative Plan: Extubation in OR  Informed Consent: I have reviewed the patients History and Physical, chart, labs and discussed the procedure including the risks, benefits and alternatives for the proposed anesthesia with the patient or authorized representative who has indicated his/her understanding and acceptance.   Dental Advisory Given  Plan Discussed with: Anesthesiologist, CRNA and Surgeon  Anesthesia Plan Comments: (Patient consented for risks of anesthesia including but not limited to:  - adverse reactions to medications - damage to teeth, lips or other oral mucosa - sore throat or hoarseness - Damage to heart, brain, lungs or loss of  life  Patient voiced understanding.)        Anesthesia Quick Evaluation

## 2018-11-07 NOTE — Transfer of Care (Signed)
Immediate Anesthesia Transfer of Care Note  Patient: Richard Alexander  Procedure(s) Performed: RADIOACTIVE SEED IMPLANT/BRACHYTHERAPY IMPLANT (N/A )  Patient Location: PACU     Anesthesia Type:General  Level of Consciousness: awake, alert  and oriented  Airway & Oxygen Therapy: Patient Spontanous Breathing and Patient connected to face mask oxygen  Post-op Assessment: Report given to RN and Post -op Vital signs reviewed and stable  Post vital signs: Reviewed and stable  Last Vitals:  Vitals Value Taken Time  BP    Temp    Pulse 62 11/07/2018  9:53 AM  Resp 10 11/07/2018  9:53 AM  SpO2 100 % 11/07/2018  9:53 AM  Vitals shown include unvalidated device data.  Last Pain:  Vitals:   11/07/18 0730  TempSrc: Oral  PainSc: 0-No pain         Complications: No apparent anesthesia complications

## 2018-11-07 NOTE — Anesthesia Post-op Follow-up Note (Signed)
Anesthesia QCDR form completed.        

## 2018-11-07 NOTE — Anesthesia Procedure Notes (Signed)
Procedure Name: Intubation Date/Time: 11/07/2018 8:46 AM Performed by: Philbert Riser, CRNA Pre-anesthesia Checklist: Patient identified, Emergency Drugs available, Suction available, Patient being monitored and Timeout performed Patient Re-evaluated:Patient Re-evaluated prior to induction Oxygen Delivery Method: Circle system utilized and Simple face mask Preoxygenation: Pre-oxygenation with 100% oxygen Induction Type: IV induction Ventilation: Mask ventilation without difficulty Laryngoscope Size: Mac and 3 Grade View: Grade I Tube type: Oral Tube size: 7.5 mm Number of attempts: 1 Airway Equipment and Method: Stylet Placement Confirmation: ETT inserted through vocal cords under direct vision,  positive ETCO2 and breath sounds checked- equal and bilateral Secured at: 21 cm Tube secured with: Tape Dental Injury: Teeth and Oropharynx as per pre-operative assessment

## 2018-11-07 NOTE — Interval H&P Note (Signed)
History and Physical Interval Note:  11/07/2018 8:26 AM  Richard Alexander  has presented today for surgery, with the diagnosis of prostate cancer  The various methods of treatment have been discussed with the patient and family. After consideration of risks, benefits and other options for treatment, the patient has consented to  Procedure(s): RADIOACTIVE SEED IMPLANT/BRACHYTHERAPY IMPLANT (N/A) as a surgical intervention .  The patient's history has been reviewed, patient examined, no change in status, stable for surgery.  I have reviewed the patient's chart and labs.  Questions were answered to the patient's satisfaction.    RRR CTAB  Hollice Espy

## 2018-11-07 NOTE — OR Nursing (Signed)
Void postop 175, post void scan 140.  Notified Dr. Erlene Quan.  May discharge to home per MD.

## 2018-11-07 NOTE — Progress Notes (Signed)
Radiation Oncology I-125 interstitial implant operative note  Name: Richard Alexander   Date:   11/07/2018 MRN:  [id number] DOB: Oct 12, 1941    This 77 y.o. male presents to the Hospital today for I-125 interstitial implant for adenocarcinoma the prostate  REFERRING PROVIDER: Ezequiel Kayser, MD  HPI: patient is a 77 year old male with known biopsy positive for Gleason 7 (3+4) adenocarcinoma the prostate. He is seen today in the operating room for I-125 interstitial implant done with urology..  COMPLICATIONS OF TREATMENT: none  FOLLOW UP COMPLIANCE: keeps appointments   PHYSICAL EXAM:  There were no vitals taken for this visit. Well-developed well-nourished patient in NAD. HEENT reveals PERLA, EOMI, discs not visualized.  Oral cavity is clear. No oral mucosal lesions are identified. Neck is clear without evidence of cervical or supraclavicular adenopathy. Lungs are clear to A&P. Cardiac examination is essentially unremarkable with regular rate and rhythm without murmur rub or thrill. Abdomen is benign with no organomegaly or masses noted. Motor sensory and DTR levels are equal and symmetric in the upper and lower extremities. Cranial nerves II through XII are grossly intact. Proprioception is intact. No peripheral adenopathy or edema is identified. No motor or sensory levels are noted. Crude visual fields are within normal range.  RADIOLOGY RESULTS: ultrasound guidance use resource placement as well as plain films for seed count  PLAN: patient was taken to the operating room and general anesthesia was administered. Legs were immobilized in stirrups and patient was positioned in the exact same proportions as original volume study. Patient was prepped and Foley catheter was placed. Ultrasound guidance identified the prostate and recreated the original set up as per treatment planning volume study. 24 needles were placed under ultrasound guidance with PVCs delivered to the prostate volume. After  completion of procedure cystoscopy was performed by urology and no evidence of seeds in the bladder were noted. Patient tolerated the procedure extremely well. Initial plain film as doublecheck identified 78 seeds in the prostate. Patient has followup appointment in one month for CT scan for quality assurance will be performed.     Noreene Filbert, MD

## 2018-11-07 NOTE — Op Note (Signed)
Preoperative diagnosis: Adenocarcinoma of the prostate   Postoperative diagnosis: Same   Procedure: I-125 prostate seed implantation, cystoscopy  Surgeon: Hollice Espy M.D.  Radiation Oncology: Lavena Stanford, M.D.   Anesthesia: General  Drains: none  Complications: none  Indications: prostate cancer  Procedure: Patient was brought to operating suite and placement table in the supine position. At this time, a universal timeout protocol was performed, all team members were identified, Venodyne boots are placed, and he was administered IV Ancef in the preoperative period. He was placed in lithotomy position and prepped and draped in usual manner. Radiation oncology department placed a transrectal ultrasound probe anchoring stand/ grid and aligned with previous imaging from the volume study. Foley catheter was inserted without difficulty.  All needle passage was done with real-time transrectal ultrasound guidance in both the transverse and sagittal plains in order to achieve the desired preplanned position. A total of 24 needles were placed.  78 active seeds were implanted. The Foley catheter was removed and a rigid cystoscopy failed to show any seeds outside the prostate without evidence of trauma to the urethral, prostatic fossa, or bladder.  The bladder was drained.  A fluoroscopic image was then obtained showing excellent distrubution of the brachytherapy seeds.  Each seed was counted and counts were correct.    The patient was then repositioned in the supine position, reversed from anesthesia, and taken to the PACU in stable condition.

## 2018-11-07 NOTE — Discharge Instructions (Addendum)
Brachytherapy for Prostate Cancer, Care After °Refer to this sheet in the next few weeks. These instructions provide you with information on caring for yourself after your procedure. Your health care provider may also give you more specific instructions. Your treatment has been planned according to current medical practices, but problems sometimes occur. Call your health care provider if you have any problems or questions after your procedure. °What can I expect after the procedure? °The area behind the scrotum will probably be tender and bruised. For a short period of time you may have: °· Difficulty passing urine. You may need a catheter for a few days to a month. °· Blood in the urine or semen. °· A feeling of constipation because of prostate swelling. °· Frequent feeling of an urgent need to urinate. ° °For a long period of time you may have: °· Inflammation of the rectum. This happens in about 2% of people who have the procedure. °· Erection problems. These vary with age and occur in about 15-40% of men. °· Difficulty urinating. This is caused by scarring in the urethra. °· Diarrhea. ° °Follow these instructions at home: °· Take medicines only as directed by your health care provider. °· You will probably have a catheter in your bladder for several days. You will have blood in the urine bag and should drink a lot of fluids to keep it a light red color. °· Keep all follow-up visits as directed by your health care provider. If you have a catheter, it will be removed during one of these visits. °· Try not to sit directly on the area behind the scrotum. A soft cushion can decrease the discomfort. Ice packs may also be helpful for the discomfort. Do not put ice directly on the skin. °· Shower and wash the area behind the scrotum gently. Do not sit in a tub. °· If you have had the brachytherapy that uses the seeds, limit your close contact with children and pregnant women for 2 months because of the radiation still  in the prostate. After that period of time, the levels drop off quickly. °Get help right away if: °· You have a fever. °· You have chills. °· You have shortness of breath. °· You have chest pain. °· You have thick blood, like tomato juice, in the urine bag. °· Your catheter is blocked so urine cannot get into the bag. Your bladder area or lower abdomen may be swollen. °· There is excessive bleeding from your rectum. It is normal to have a little blood mixed with your stool. °· There is severe discomfort in the treated area that does not go away with pain medicine. °· You have abdominal discomfort. °· You have severe nausea or vomiting. °· You develop any new or unusual symptoms. °This information is not intended to replace advice given to you by your health care provider. Make sure you discuss any questions you have with your health care provider. °Document Released: 01/09/2011 Document Revised: 05/20/2016 Document Reviewed: 05/30/2013 °Elsevier Interactive Patient Education © 2017 [employer] ° ° ° °AMBULATORY SURGERY  °DISCHARGE INSTRUCTIONS ° ° °1) The drugs that you were given will stay in your system until tomorrow so for the next 24 hours you should not: ° °A) Drive an automobile °B) Make any legal decisions °C) Drink any alcoholic beverage ° ° °2) You may resume regular meals tomorrow.  Today it is better to start with liquids and gradually work up to solid foods. ° °You may eat anything   you prefer, but it is better to start with liquids, then soup and crackers, and gradually work up to solid foods. ° ° °3) Please notify your doctor immediately if you have any unusual bleeding, trouble breathing, redness and pain at the surgery site, drainage, fever, or pain not relieved by medication. ° ° ° °4) Additional Instructions: ° ° ° ° ° ° ° °Please contact your physician with any problems or Same Day Surgery at [phone], Monday through Friday 6 am to 4 pm, or [place] [facility] [place] Main number at  [phone]. °

## 2018-11-07 NOTE — Anesthesia Postprocedure Evaluation (Signed)
Anesthesia Post Note  Patient: Richard Alexander  Procedure(s) Performed: RADIOACTIVE SEED IMPLANT/BRACHYTHERAPY IMPLANT (N/A )  Patient location during evaluation: PACU Anesthesia Type: General Level of consciousness: awake and alert Pain management: pain level controlled Vital Signs Assessment: post-procedure vital signs reviewed and stable Respiratory status: spontaneous breathing, nonlabored ventilation, respiratory function stable and patient connected to nasal cannula oxygen Cardiovascular status: blood pressure returned to baseline and stable Postop Assessment: no apparent nausea or vomiting Anesthetic complications: no     Last Vitals:  Vitals:   11/07/18 1039 11/07/18 1058  BP:  (!) 175/89  Pulse:  64  Resp:  18  Temp: (!) 36.3 C (!) 36 C  SpO2:  98%    Last Pain:  Vitals:   11/07/18 1058  TempSrc: Temporal  PainSc: 0-No pain                 Precious Haws Piscitello

## 2018-11-08 ENCOUNTER — Encounter: Payer: Self-pay | Admitting: Urology

## 2018-11-10 ENCOUNTER — Ambulatory Visit
Admission: RE | Admit: 2018-11-10 | Discharge: 2018-11-10 | Disposition: A | Discharge status: Home / Self Care | Payer: Medicare Other | Source: Ambulatory Visit | Attending: Urology | Admitting: Urology

## 2018-11-10 ENCOUNTER — Encounter (INDEPENDENT_AMBULATORY_CARE_PROVIDER_SITE_OTHER): Payer: Self-pay

## 2018-11-10 ENCOUNTER — Telehealth: Payer: Self-pay | Admitting: Urology

## 2018-11-10 DIAGNOSIS — J432 Centrilobular emphysema: Secondary | ICD-10-CM | POA: Diagnosis not present

## 2018-11-10 DIAGNOSIS — R9389 Abnormal findings on diagnostic imaging of other specified body structures: Secondary | ICD-10-CM | POA: Diagnosis not present

## 2018-11-10 DIAGNOSIS — I700 Atherosclerosis of aorta: Secondary | ICD-10-CM | POA: Diagnosis not present

## 2018-11-10 NOTE — Telephone Encounter (Signed)
-----   Message from Hollice Espy, MD sent at 11/10/2018 11:39 AM EST ----- Your chest CT was completely normal!  Great news.

## 2018-11-10 NOTE — Telephone Encounter (Signed)
Patient notified on vmail 

## 2018-11-10 NOTE — Telephone Encounter (Signed)
Pt returned missed call from office. No documentation of a call. Results from chest CT read. Thank you.

## 2018-12-05 ENCOUNTER — Other Ambulatory Visit: Payer: Self-pay | Admitting: *Deleted

## 2018-12-05 ENCOUNTER — Ambulatory Visit
Admission: RE | Admit: 2018-12-05 | Discharge: 2018-12-05 | Disposition: A | Discharge status: Home / Self Care | Payer: Medicare Other | Source: Ambulatory Visit | Attending: Radiation Oncology | Admitting: Radiation Oncology

## 2018-12-05 ENCOUNTER — Encounter: Payer: Self-pay | Admitting: Radiation Oncology

## 2018-12-05 ENCOUNTER — Other Ambulatory Visit: Payer: Self-pay

## 2018-12-05 VITALS — BP 147/83 | HR 92 | Temp 97.1°F | Resp 18 | Wt 207.7 lb

## 2018-12-05 DIAGNOSIS — C61 Malignant neoplasm of prostate: Secondary | ICD-10-CM

## 2018-12-05 DIAGNOSIS — Z923 Personal history of irradiation: Secondary | ICD-10-CM | POA: Insufficient documentation

## 2018-12-05 DIAGNOSIS — Z510 Encounter for antineoplastic radiation therapy: Secondary | ICD-10-CM | POA: Insufficient documentation

## 2018-12-05 NOTE — Progress Notes (Signed)
Radiation Oncology Follow up Note  Name: Richard Alexander   Date:   12/05/2018 MRN:  [id number] DOB: Mar 13, 1941    This 77 y.o. male presents to the clinic today for one-month follow-up status post I-125 interstitial implant for Gleason 7 adenocarcinoma the prostate.  REFERRING PROVIDER: Ezequiel Kayser, MD  HPI: patient is a 77 year old male now seen out 1 month status post I-125 interstitial implant for Gleason 7 (3+4) adenocarcinoma the prostate. He is seen today in routine follow up is doing well. He specifically denies diarrhea dysuria or any other GI/GU complaints..  COMPLICATIONS OF TREATMENT: none  FOLLOW UP COMPLIANCE: keeps appointments   PHYSICAL EXAM:  BP (!) 147/83 (BP Location: Left Arm, Patient Position: Sitting)   Pulse 92   Temp (!) 97.1 F (36.2 C) (Tympanic)   Resp 18   Wt 207 lb 10.8 oz (94.2 kg)   BMI 36.79 kg/m  On rectal exam rectal sphincter tone is good. Prostate is smooth contracted without evidence of nodularity or mass. Sulcus is preserved bilaterally. No discrete nodularity is identified. No other rectal abnormalities are noted.Well-developed well-nourished patient in NAD. HEENT reveals PERLA, EOMI, discs not visualized.  Oral cavity is clear. No oral mucosal lesions are identified. Neck is clear without evidence of cervical or supraclavicular adenopathy. Lungs are clear to A&P. Cardiac examination is essentially unremarkable with regular rate and rhythm without murmur rub or thrill. Abdomen is benign with no organomegaly or masses noted. Motor sensory and DTR levels are equal and symmetric in the upper and lower extremities. Cranial nerves II through XII are grossly intact. Proprioception is intact. No peripheral adenopathy or edema is identified. No motor or sensory levels are noted. Crude visual fields are within normal range.  RADIOLOGY RESULTS: CT scan for quality assurance is reviewed andfinal report will be issued on source placement  PLAN: at this time  initial inspection of his CT scan for source placement looks excellent. One or 2 seeds May of coronary vessel and migrated slightly outside the prostate. He continues to do extremely well. I've asked to see him back in 3-4 months and we'll perform a PSA at that time.  I would like to take this opportunity to thank you for allowing me to participate in the care of your patient.Noreene Filbert, MD

## 2018-12-07 DIAGNOSIS — C61 Malignant neoplasm of prostate: Secondary | ICD-10-CM | POA: Diagnosis not present

## 2018-12-07 DIAGNOSIS — Z510 Encounter for antineoplastic radiation therapy: Secondary | ICD-10-CM | POA: Diagnosis not present

## 2018-12-08 DIAGNOSIS — Z510 Encounter for antineoplastic radiation therapy: Secondary | ICD-10-CM | POA: Diagnosis not present

## 2018-12-22 ENCOUNTER — Ambulatory Visit: Payer: Medicare Other | Admitting: Urology

## 2018-12-22 ENCOUNTER — Encounter: Payer: Self-pay | Admitting: Urology

## 2018-12-22 VITALS — BP 122/71 | HR 79 | Ht 63.0 in | Wt 209.4 lb

## 2018-12-22 DIAGNOSIS — C61 Malignant neoplasm of prostate: Secondary | ICD-10-CM | POA: Diagnosis not present

## 2018-12-22 DIAGNOSIS — N401 Enlarged prostate with lower urinary tract symptoms: Secondary | ICD-10-CM

## 2018-12-22 LAB — BLADDER SCAN AMB NON-IMAGING: Scan Result: 61

## 2018-12-22 MED ORDER — LEUPROLIDE ACETATE (6 MONTH) 45 MG IM KIT
45.0000 mg | PACK | Freq: Once | INTRAMUSCULAR | Status: AC
  Administered 2018-12-22: 45 mg via INTRAMUSCULAR

## 2018-12-22 NOTE — Progress Notes (Signed)
Lupron IM Injection   Due to Prostate Cancer patient is present today for a Lupron Injection.  Medication: Lupron 6 month Dose: 45 mg  Location: left upper outer buttocks Lot: [id number] Exp: 03/05/2021  Patient tolerated well, no complications were noted  Performed by: Elberta Leatherwood, CMA  Follow up: 6 months

## 2018-12-22 NOTE — Patient Instructions (Signed)
Please start calcium 600 mg bid with vitamin d3 400 I.U. bid

## 2018-12-22 NOTE — Progress Notes (Signed)
4:42 PM   Richard Alexander 1941/09/11 [id number]  Referring provider: Ezequiel Kayser, MD [place] [facility] [place], [place] [address]  Chief Complaint  Patient presents with  . Follow-up  . Prostate Cancer    HPI: Richard Alexander is a 78 y.o. male who presents today for a follow up on his prostate cancer.  He underwent previous prostate biopsy on 08/12/2018 for an elevated PSA rising on finasteride, 5.3 at the time of biopsy (10.6 adjusted).  He was also noted to have induration on the right side of his prostate gland on rectal exam.  Transrectal ultrasound volume 26 g.  Prostate biopsy showed 7/12 cores positive of Gleason 3+4 and 3+3 prostate cancer.  His primary tumor was on the right with 3 cores of Gleason 3+4 involving up to 86% of the tissue right apex and mid gland.  The remainder of the prostate showed low volume Gleason 3+3 dispersed throughout the prostate.  Patient had 1-125 prostate seeds implanted on 11/07/2018 with a follow-up on 12/05/18.  At the time of follow-up, he was doing well and specifically denied diarrhea, dysuria, or any other GI/GU complaints.  Urinary symptoms are fairly well-controlled on finasteride and Flomax.  He does have baseline erectile dysfunction.  IPSS symptoms as below.  He meant to mark mostly satisfied.   IPSS    Row Name 12/22/18 1500         International Prostate Symptom Score   How often have you had the sensation of not emptying your bladder?  Less than 1 in 5     How often have you had to urinate less than every two hours?  About half the time     How often have you found you stopped and started again several times when you urinated?  Not at All     How often have you found it difficult to postpone urination?  About half the time     How often have you had a weak urinary stream?  More than half the time     How often have you had to strain to start urination?  Not at All     How many times did you  typically get up at night to urinate?  3 Times     Total IPSS Score  14       Quality of Life due to urinary symptoms   If you were to spend the rest of your life with your urinary condition just the way it is now how would you feel about that?  Mixed        Score:  1-7 Mild 8-19 Moderate 20-35 Severe  PMH: Past Medical History:  Diagnosis Date  . Allergic rhinitis   . Arthritis   . DDD (degenerative disc disease), cervical   . Hypertension   . Presence of permanent cardiac pacemaker   . Sleep apnea    CPAP    Surgical History: Past Surgical History:  Procedure Laterality Date  . adenomatous polyp  06/08/2018  . ANTERIOR CERVICAL DISCECTOMY  ?  Marland Kitchen BACK SURGERY    . COLONOSCOPY WITH PROPOFOL N/A 07/25/2018   Procedure: COLONOSCOPY WITH PROPOFOL;  Surgeon: Manya Silvas, MD;  Location: [facility] ENDOSCOPY;  Service: Endoscopy;  Laterality: N/A;  . HAMMER TOE SURGERY Right 2007   2nd toe  . INSERT / REPLACE / REMOVE PACEMAKER    . LUMBAR LAMINECTOMY/DECOMPRESSION MICRODISCECTOMY Bilateral 10/28/2016   Procedure: LUMBAR LAMINECTOMY/DECOMPRESSION MICRODISCECTOMY 3 LEVELS;  Surgeon: Meade Maw, MD;  Location: [facility] ORS;  Service: Neurosurgery;  Laterality: Bilateral;  L3-4, L4-5,L5-S1 Laminectomies and bilateral foraminotomies  . PACEMAKER INSERTION  2011  . RADIOACTIVE SEED IMPLANT N/A 11/07/2018   Procedure: RADIOACTIVE SEED IMPLANT/BRACHYTHERAPY IMPLANT;  Surgeon: Hollice Espy, MD;  Location: [facility] ORS;  Service: Urology;  Laterality: N/A;    Home Medications:  Allergies as of 12/22/2018   No Known Allergies     Medication List       Accurate as of December 22, 2018  4:42 PM. Always use your most recent med list.        acetaminophen 500 MG tablet Commonly known as:  TYLENOL Take 1,000 mg by mouth every 6 (six) hours as needed (for pain.).   aspirin EC 81 MG tablet Take 81 mg by mouth daily.   CERAVE EX Apply 1 application topically 3 (three) times daily as  needed (for skin irritation).   docusate sodium 100 MG capsule Commonly known as:  COLACE Take 1 capsule (100 mg total) by mouth 2 (two) times daily.   fexofenadine-pseudoephedrine 180-240 MG 24 hr tablet Commonly known as:  ALLEGRA-D 24 Take 1 tablet by mouth daily as needed (allergy).   finasteride 5 MG tablet Commonly known as:  PROSCAR Take 1 tablet (5 mg total) by mouth daily.   fluocinonide cream 0.05 % Commonly known as:  LIDEX Apply 1 application topically 2 (two) times daily as needed (for skin irritation).   hydrochlorothiazide 25 MG tablet Commonly known as:  HYDRODIURIL Take 25 mg by mouth daily.   hydrocortisone 2.5 % cream Apply 1 application topically 2 (two) times daily as needed (for skin irritation).   polyethylene glycol packet Commonly known as:  MIRALAX / GLYCOLAX Take 17 g by mouth daily as needed (constipation.).   potassium chloride SA 20 MEQ tablet Commonly known as:  K-DUR,KLOR-CON Take 20 mEq by mouth daily.   pravastatin 20 MG tablet Commonly known as:  PRAVACHOL Take 20 mg by mouth every evening.   sildenafil 20 MG tablet Commonly known as:  REVATIO Take 40-100 mg by mouth daily as needed (erectile dysfunction.).   tamsulosin 0.4 MG Caps capsule Commonly known as:  FLOMAX TAKE 1 CAPSULE BY MOUTH ONCE DAILY   vitamin B-12 1000 MCG tablet Commonly known as:  CYANOCOBALAMIN Take 1,000 mcg by mouth daily.       Allergies: No Known Allergies  Family History: Family History  Problem Relation Age of Onset  . Hypertension Father   . Stroke Father   . Colon cancer Father   . Aortic aneurysm Mother   . Prostate cancer Neg Hx   . Kidney cancer Neg Hx   . Bladder Cancer Neg Hx     Social History:  reports that he quit smoking about 43 years ago. His smoking use included cigarettes. He has never used smokeless tobacco. He reports that he does not drink alcohol or use drugs.  ROS: UROLOGY Frequent Urination?: Yes Hard to postpone  urination?: No Burning/pain with urination?: No Get up at night to urinate?: Yes Leakage of urine?: No Urine stream starts and stops?: No Trouble starting stream?: No Do you have to strain to urinate?: No Blood in urine?: No Urinary tract infection?: No Sexually transmitted disease?: No Injury to kidneys or bladder?: No Painful intercourse?: No Weak stream?: Yes Erection problems?: Yes Penile pain?: No  Gastrointestinal Nausea?: No Vomiting?: No Indigestion/heartburn?: No Diarrhea?: No Constipation?: No  Constitutional Fever: No Night sweats?: No Weight loss?: No  Fatigue?: No  Skin Skin rash/lesions?: No Itching?: No  Eyes Blurred vision?: No Double vision?: No  Ears/Nose/Throat Sore throat?: No Sinus problems?: No  Hematologic/Lymphatic Swollen glands?: No Easy bruising?: No  Cardiovascular Leg swelling?: No Chest pain?: No  Respiratory Cough?: No Shortness of breath?: No  Endocrine Excessive thirst?: No  Musculoskeletal Back pain?: No Joint pain?: No  Neurological Headaches?: No Dizziness?: No  Psychologic Depression?: No Anxiety?: No  Physical Exam: BP 122/71 (BP Location: Left Arm, Patient Position: Sitting, Cuff Size: Normal)   Pulse 79   Ht 5\' 3"  (1.6 m)   Wt 209 lb 6.4 oz (95 kg)   BMI 37.09 kg/m   Constitutional:  Well nourished. Alert and oriented, No acute distress. HEENT: [place] AT, moist mucus membranes.  Trachea midline, no masses. Cardiovascular: No clubbing, cyanosis, or edema. Respiratory: Normal respiratory effort, no increased work of breathing. Skin: No rashes, bruises or suspicious lesions. Neurologic: Grossly intact, no focal deficits, moving all 4 extremities. Psychiatric: Normal mood and affect.  Laboratory Data: Lab Results  Component Value Date   WBC 8.2 10/31/2018   HGB 13.3 10/31/2018   HCT 41.2 10/31/2018   MCV 94.3 10/31/2018   PLT 347 10/31/2018    Lab Results  Component Value Date   CREATININE  0.89 10/19/2016    No results found for: PSA  No results found for: TESTOSTERONE  No results found for: HGBA1C  No results found for: TSH  No results found for: CHOL, HDL, CHOLHDL, VLDL, LDLCALC  Lab Results  Component Value Date   AST 24 10/19/2016   Lab Results  Component Value Date   ALT 22 10/19/2016   No components found for: ALKALINEPHOPHATASE No components found for: BILIRUBINTOTAL  No results found for: ESTRADIOL  Urinalysis    Component Value Date/Time   COLORURINE YELLOW (A) 10/19/2016 1055   APPEARANCEUR CLEAR (A) 10/19/2016 1055   LABSPEC 1.013 10/19/2016 1055   PHURINE 6.0 10/19/2016 1055   GLUCOSEU NEGATIVE 10/19/2016 1055   HGBUR 1+ (A) 10/19/2016 1055   BILIRUBINUR NEGATIVE 10/19/2016 [place] 10/19/2016 1055   PROTEINUR NEGATIVE 10/19/2016 1055   NITRITE NEGATIVE 10/19/2016 [place] 10/19/2016 1055    I have reviewed the labs.   Assessment & Plan:    1. Prostate Cancer [facility]) Newly diagnosed Gleason 3+4 intermediate risk disease, cT2 - We did discuss the risks of ADT therapy, such as: weight gain, ED, decrease in libido, fatigue, hot flashes, back pain, joint pain, chills, constipation, heart disease, itching where the injection is given and pain where the injection is given.  We also discussed bone health and I have advised the patient to start Calcium 600 IU twice a daily and Vitamin D3 400 IU twice daily. - 6 months Lupron injection was given today. - Follow-up for PSA on March 03, 2019, and with his radiation oncologist Dr. Baruch Gouty on March 20th. - Follow-up here in 6 months.  2. BPH with obstruction/lower urinary tract symptoms Continue Flomax and finasteride, symptoms fairly well controlled on these 2 medications IPSS is 14/2  3. Erectile dysfunction of organic origin Baseline erectile dysfunction, uses sildenafil as needed   Return in about 6 months (around 06/22/2019) for Lupron PSA .  These  notes generated with voice recognition software. I apologize for typographical errors.  Lemar Lofty, am acting as a Education administrator for [employer], PA-C.   I have reviewed the above documentation for accuracy and completeness, and  I agree with the above.    Zara Council, PA-C   [facility] [address]  [place], [place] [address] [phone]

## 2018-12-23 ENCOUNTER — Other Ambulatory Visit: Payer: Self-pay | Admitting: Urology

## 2019-01-13 ENCOUNTER — Encounter: Payer: Self-pay | Admitting: *Deleted

## 2019-03-03 ENCOUNTER — Inpatient Hospital Stay: Payer: Medicare Other | Attending: Radiation Oncology

## 2019-03-03 ENCOUNTER — Other Ambulatory Visit: Payer: Self-pay

## 2019-03-03 DIAGNOSIS — C61 Malignant neoplasm of prostate: Secondary | ICD-10-CM

## 2019-03-03 LAB — PSA: PROSTATIC SPECIFIC ANTIGEN: 0.08 ng/mL (ref 0.00–4.00)

## 2019-03-10 ENCOUNTER — Ambulatory Visit: Payer: Medicare Other | Admitting: Radiation Oncology

## 2019-03-11 ENCOUNTER — Other Ambulatory Visit: Payer: Self-pay | Admitting: Urology

## 2019-03-11 DIAGNOSIS — N401 Enlarged prostate with lower urinary tract symptoms: Secondary | ICD-10-CM

## 2019-03-16 ENCOUNTER — Other Ambulatory Visit: Payer: Self-pay

## 2019-03-17 ENCOUNTER — Ambulatory Visit
Admission: RE | Admit: 2019-03-17 | Discharge: 2019-03-17 | Disposition: A | Discharge status: Home / Self Care | Payer: Medicare Other | Source: Ambulatory Visit | Attending: Radiation Oncology | Admitting: Radiation Oncology

## 2019-03-17 ENCOUNTER — Encounter: Payer: Self-pay | Admitting: Radiation Oncology

## 2019-03-17 ENCOUNTER — Other Ambulatory Visit: Payer: Self-pay

## 2019-03-17 VITALS — BP 123/71 | HR 95 | Resp 18 | Wt 218.1 lb

## 2019-03-17 DIAGNOSIS — Z923 Personal history of irradiation: Secondary | ICD-10-CM | POA: Insufficient documentation

## 2019-03-17 DIAGNOSIS — C61 Malignant neoplasm of prostate: Secondary | ICD-10-CM | POA: Diagnosis present

## 2019-03-17 NOTE — Progress Notes (Signed)
Radiation Oncology Follow up Note  Name: Richard Alexander   Date:   03/17/2019 MRN:  [id number] DOB: 01-12-1941    This 78 y.o. male presents to the clinic today for 4 month follow-up status post I-125 interstitial implant for Gleason 7 adenocarcinoma the prostate.  REFERRING PROVIDER: Ezequiel Kayser, MD  HPI: patient is a 78 year old male now out 4 months having completed I-125 interstitial implant for Gleason 7 (3+4) adenocarcinoma the prostate seen today in routine follow up he is doing well specifically denies any increased lower urinary tract symptoms diarrhea fatigue. His most recent PSA this month was 0.08.Marland Kitchen  COMPLICATIONS OF TREATMENT: none  FOLLOW UP COMPLIANCE: keeps appointments   PHYSICAL EXAM:  BP 123/71 (BP Location: Left Arm, Patient Position: Sitting)   Pulse 95   Resp 18   Wt 218 lb 2.3 oz (98.9 kg)   BMI 38.64 kg/m  Well-developed well-nourished patient in NAD. HEENT reveals PERLA, EOMI, discs not visualized.  Oral cavity is clear. No oral mucosal lesions are identified. Neck is clear without evidence of cervical or supraclavicular adenopathy. Lungs are clear to A&P. Cardiac examination is essentially unremarkable with regular rate and rhythm without murmur rub or thrill. Abdomen is benign with no organomegaly or masses noted. Motor sensory and DTR levels are equal and symmetric in the upper and lower extremities. Cranial nerves II through XII are grossly intact. Proprioception is intact. No peripheral adenopathy or edema is identified. No motor or sensory levels are noted. Crude visual fields are within normal range.  RADIOLOGY RESULTS: no current films for review  PLAN: present time patient is under excellent biochemical control of his prostate cancer. I am please was overall progress. I've asked to see him back in 6 months for follow-up. Patient knows to call at anytime with any concerns.  I would like to take this opportunity to thank you for allowing me to participate  in the care of your patient.Noreene Filbert, MD

## 2019-04-03 ENCOUNTER — Telehealth: Payer: Self-pay | Admitting: Urology

## 2019-04-03 NOTE — Telephone Encounter (Signed)
Patient called and said that he is getting up 5-6 times a night to urinate. He said that he is still taking the Flomax and finasteride but he is still getting up a lot. What should he do?   Sharyn Lull

## 2019-05-09 ENCOUNTER — Telehealth: Payer: Self-pay | Admitting: Urology

## 2019-05-09 NOTE — Telephone Encounter (Signed)
Pt called and states he is having to get up multiple times every night and would like to know if there is anything he can take to help. Please advise.

## 2019-05-09 NOTE — Telephone Encounter (Signed)
A virtual visit for Richard Alexander will be fine.

## 2019-05-09 NOTE — Telephone Encounter (Signed)
Please schedule. thanks

## 2019-05-09 NOTE — Telephone Encounter (Signed)
Should he be set up for a in office or virtual visit thanks

## 2019-05-10 NOTE — Telephone Encounter (Signed)
Left message for patient to call the office to schedule a virtual visit.

## 2019-05-11 NOTE — Telephone Encounter (Signed)
Pt called office and stated he doesn't want to have a virtual visit, he will just wait for his appt in July.

## 2019-06-22 ENCOUNTER — Telehealth: Payer: Self-pay | Admitting: Urology

## 2019-06-22 ENCOUNTER — Other Ambulatory Visit: Payer: Self-pay | Admitting: Family Medicine

## 2019-06-22 DIAGNOSIS — C61 Malignant neoplasm of prostate: Secondary | ICD-10-CM

## 2019-06-22 NOTE — Telephone Encounter (Signed)
PA FOR Richard Alexander [id number] 06-22-19 THRU 06-21-2020

## 2019-06-26 ENCOUNTER — Other Ambulatory Visit: Payer: Self-pay

## 2019-06-26 ENCOUNTER — Other Ambulatory Visit: Payer: Medicare Other

## 2019-06-26 DIAGNOSIS — C61 Malignant neoplasm of prostate: Secondary | ICD-10-CM

## 2019-06-27 LAB — PSA: Prostate Specific Ag, Serum: <0.1 ng/mL (ref 0.0–4.0)

## 2019-06-27 NOTE — Progress Notes (Signed)
11:29 AM   Richard Alexander 06-01-1941 [id number]  Referring provider: Ezequiel Kayser, MD [facility] [place],  [place] [address]  Chief Complaint  Patient presents with  . Prostate Cancer    HPI: Richard Alexander is a 78 y.o. male who presents today for a follow up on his prostate cancer.  Prostate cancer He underwent previous prostate biopsy on 08/12/2018 for an elevated PSA rising on finasteride, 5.3 at the time of biopsy (10.6 adjusted).  He was also noted to have induration on the right side of his prostate gland on rectal exam.  Transrectal ultrasound volume 26 g.  Prostate biopsy showed 7/12 cores positive of Gleason 3+4 and 3+3 prostate cancer.  His primary tumor was on the right with 3 cores of Gleason 3+4 involving up to 86% of the tissue right apex and mid gland.  The remainder of the prostate showed low volume Gleason 3+3 dispersed throughout the prostate.  Patient had 1-125 prostate seeds implanted on 11/07/2018 with a follow-up on 12/05/18.  Has received 6 months Lupon in 12/2018.  Most recent PSA <0.1 on 06/26/2019.    BPH WITH LUTS  (prostate and/or bladder) IPSS score: 13/3     PVR: 0 mL    Previous score: 14/3   Previous PVR: 61 mL  Major complaint(s):  Frequency, nocturia x q hour and a weak urinary stream  x several months. Denies any dysuria, hematuria or suprapubic pain.   Currently taking: tamsulosin 0.4 mg and finasteride 5 mg daily   Denies any recent fevers, chills, nausea or vomiting.  IPSS    Row Name 06/28/19 1100         International Prostate Symptom Score   How often have you had the sensation of not emptying your bladder?  Less than 1 in 5     How often have you had to urinate less than every two hours?  About half the time     How often have you found you stopped and started again several times when you urinated?  Not at All     How often have you found it difficult to postpone urination?  About half the time     How  often have you had a weak urinary stream?  Less than 1 in 5 times     How often have you had to strain to start urination?  Not at All     How many times did you typically get up at night to urinate?  5 Times     Total IPSS Score  13       Quality of Life due to urinary symptoms   If you were to spend the rest of your life with your urinary condition just the way it is now how would you feel about that?  Mixed        Score:  1-7 Mild 8-19 Moderate 20-35 Severe  PMH: Past Medical History:  Diagnosis Date  . Allergic rhinitis   . Arthritis   . DDD (degenerative disc disease), cervical   . Hypertension   . Presence of permanent cardiac pacemaker   . Sleep apnea    CPAP    Surgical History: Past Surgical History:  Procedure Laterality Date  . adenomatous polyp  06/08/2018  . ANTERIOR CERVICAL DISCECTOMY  ?  Marland Kitchen BACK SURGERY    . COLONOSCOPY WITH PROPOFOL N/A 07/25/2018   Procedure: COLONOSCOPY WITH PROPOFOL;  Surgeon: Manya Silvas, MD;  Location: [facility] ENDOSCOPY;  Service: Endoscopy;  Laterality: N/A;  . HAMMER TOE SURGERY Right 2007   2nd toe  . INSERT / REPLACE / REMOVE PACEMAKER    . LUMBAR LAMINECTOMY/DECOMPRESSION MICRODISCECTOMY Bilateral 10/28/2016   Procedure: LUMBAR LAMINECTOMY/DECOMPRESSION MICRODISCECTOMY 3 LEVELS;  Surgeon: Meade Maw, MD;  Location: [facility] ORS;  Service: Neurosurgery;  Laterality: Bilateral;  L3-4, L4-5,L5-S1 Laminectomies and bilateral foraminotomies  . PACEMAKER INSERTION  2011  . RADIOACTIVE SEED IMPLANT N/A 11/07/2018   Procedure: RADIOACTIVE SEED IMPLANT/BRACHYTHERAPY IMPLANT;  Surgeon: Hollice Espy, MD;  Location: [facility] ORS;  Service: Urology;  Laterality: N/A;    Home Medications:  Allergies as of 06/28/2019   No Known Allergies     Medication List       Accurate as of June 28, 2019 11:29 AM. If you have any questions, ask your nurse or doctor.        STOP taking these medications   CERAVE EX Stopped by: Naven Giambalvo,  PA-C     TAKE these medications   acetaminophen 500 MG tablet Commonly known as: TYLENOL Take 1,000 mg by mouth every 6 (six) hours as needed (for pain.).   aspirin EC 81 MG tablet Take 81 mg by mouth daily.   Calcium 600-200 MG-UNIT tablet Take 1 tablet by mouth daily.   docusate sodium 100 MG capsule Commonly known as: COLACE Take 1 capsule (100 mg total) by mouth 2 (two) times daily.   fexofenadine-pseudoephedrine 180-240 MG 24 hr tablet Commonly known as: ALLEGRA-D 24 Take 1 tablet by mouth daily as needed (allergy).   finasteride 5 MG tablet Commonly known as: PROSCAR Take 1 tablet (5 mg total) by mouth daily.   fluocinonide cream 0.05 % Commonly known as: LIDEX Apply 1 application topically 2 (two) times daily as needed (for skin irritation).   hydrochlorothiazide 25 MG tablet Commonly known as: HYDRODIURIL Take 25 mg by mouth daily.   hydrocortisone 2.5 % cream Apply 1 application topically 2 (two) times daily as needed (for skin irritation).   mirabegron ER 25 MG Tb24 tablet Commonly known as: MYRBETRIQ Take 1 tablet (25 mg total) by mouth daily. Started by: Zara Council, PA-C   polyethylene glycol 17 g packet Commonly known as: MIRALAX / GLYCOLAX Take 17 g by mouth daily as needed (constipation.).   potassium chloride SA 20 MEQ tablet Commonly known as: K-DUR Take 20 mEq by mouth daily.   pravastatin 20 MG tablet Commonly known as: PRAVACHOL Take 20 mg by mouth every evening.   sildenafil 20 MG tablet Commonly known as: REVATIO Take 2-5 tablets (40-100 mg total) by mouth daily as needed (erectile dysfunction.).   tamsulosin 0.4 MG Caps capsule Commonly known as: FLOMAX Take 1 capsule (0.4 mg total) by mouth daily.   vitamin B-12 1000 MCG tablet Commonly known as: CYANOCOBALAMIN Take 1,000 mcg by mouth daily.       Allergies: No Known Allergies  Family History: Family History  Problem Relation Age of Onset  . Hypertension Father    . Stroke Father   . Colon cancer Father   . Aortic aneurysm Mother   . Prostate cancer Neg Hx   . Kidney cancer Neg Hx   . Bladder Cancer Neg Hx     Social History:  reports that he quit smoking about 43 years ago. His smoking use included cigarettes. He has never used smokeless tobacco. He reports that he does not drink alcohol or use drugs.  ROS: UROLOGY Frequent Urination?: Yes Hard to postpone urination?:  No Burning/pain with urination?: No Get up at night to urinate?: Yes Leakage of urine?: No Urine stream starts and stops?: No Trouble starting stream?: No Do you have to strain to urinate?: No Blood in urine?: No Urinary tract infection?: No Sexually transmitted disease?: No Injury to kidneys or bladder?: No Painful intercourse?: No Weak stream?: Yes Erection problems?: No Penile pain?: Yes  Gastrointestinal Nausea?: No Vomiting?: No Indigestion/heartburn?: No Diarrhea?: No Constipation?: No  Constitutional Fever: No Night sweats?: No Weight loss?: No Fatigue?: No  Skin Skin rash/lesions?: No Itching?: No  Eyes Blurred vision?: No Double vision?: No  Ears/Nose/Throat Sore throat?: No Sinus problems?: No  Hematologic/Lymphatic Swollen glands?: No Easy bruising?: No  Cardiovascular Leg swelling?: No Chest pain?: No  Respiratory Cough?: No Shortness of breath?: No  Endocrine Excessive thirst?: No  Musculoskeletal Back pain?: No Joint pain?: No  Neurological Headaches?: No Dizziness?: No  Psychologic Depression?: No Anxiety?: No  Physical Exam: BP 125/69 (BP Location: Left Arm, Patient Position: Sitting, Cuff Size: Normal)   Pulse 70   Ht 5\' 3"  (1.6 m)   Wt 218 lb (98.9 kg)   BMI 38.62 kg/m   Constitutional:  Well nourished. Alert and oriented, No acute distress. HEENT: [place] AT, moist mucus membranes.  Trachea midline, no masses. Cardiovascular: No clubbing, cyanosis, or edema. Respiratory: Normal respiratory effort, no  increased work of breathing. Neurologic: Grossly intact, no focal deficits, moving all 4 extremities. Psychiatric: Normal mood and affect.  Laboratory Data: Lab Results  Component Value Date   WBC 8.2 10/31/2018   HGB 13.3 10/31/2018   HCT 41.2 10/31/2018   MCV 94.3 10/31/2018   PLT 347 10/31/2018    Lab Results  Component Value Date   CREATININE 0.89 10/19/2016    No results found for: PSA  No results found for: TESTOSTERONE  No results found for: HGBA1C  No results found for: TSH  No results found for: CHOL, HDL, CHOLHDL, VLDL, LDLCALC  Lab Results  Component Value Date   AST 24 10/19/2016   Lab Results  Component Value Date   ALT 22 10/19/2016   No components found for: ALKALINEPHOPHATASE No components found for: BILIRUBINTOTAL  No results found for: ESTRADIOL  Urinalysis    Component Value Date/Time   COLORURINE YELLOW (A) 10/19/2016 1055   APPEARANCEUR CLEAR (A) 10/19/2016 1055   LABSPEC 1.013 10/19/2016 1055   PHURINE 6.0 10/19/2016 1055   GLUCOSEU NEGATIVE 10/19/2016 1055   HGBUR 1+ (A) 10/19/2016 1055   BILIRUBINUR NEGATIVE 10/19/2016 [place] 10/19/2016 1055   PROTEINUR NEGATIVE 10/19/2016 1055   NITRITE NEGATIVE 10/19/2016 [place] 10/19/2016 1055    I have reviewed the labs.  Pertinent Imaging Results for JALIEL, DEAVERS (MRN [id number]) as of 06/28/2019 11:34  Ref. Range 06/28/2019 11:30  Scan Result Unknown 0     Assessment & Plan:    1. Prostate Cancer [facility]) Gleason 3+4 intermediate risk disease, cT2 - seed implant 10/2018 with 6 months ADT PSA undetectable RTC in 6 months for PSA   2. BPH with obstruction/lower urinary tract symptoms IPSS score is 13/3, it is slightly worse Continue conservative management, avoiding bladder irritants and timed voiding's Most bothersome symptoms is/are nocturia Continue tamsulosin 0.4 mg daily and finasteride 5 mg daily: refills given RTC in 6 months for  IPSS, PSA, PVR and exam   3. Nocturia Patient is sleeping with a CPAP machine and is engaging in behavioral and dietary modification Will have a  trial of Myrbetriq 25 mg daily, #28  RTC in three weeks for I PSS and PVR  4. Erectile dysfunction of organic origin Baseline erectile dysfunction, uses sildenafil as needed   Return in about 3 weeks (around 07/19/2019) for IPSS and PVR.  These notes generated with voice recognition software. I apologize for typographical errors.  Zara Council, PA-C    [facility] [address]  [place], [place] [address] [phone]

## 2019-06-28 ENCOUNTER — Encounter: Payer: Self-pay | Admitting: Urology

## 2019-06-28 ENCOUNTER — Ambulatory Visit (INDEPENDENT_AMBULATORY_CARE_PROVIDER_SITE_OTHER): Payer: Medicare Other | Admitting: Urology

## 2019-06-28 ENCOUNTER — Other Ambulatory Visit: Payer: Self-pay

## 2019-06-28 VITALS — BP 125/69 | HR 70 | Ht 63.0 in | Wt 218.0 lb

## 2019-06-28 DIAGNOSIS — N529 Male erectile dysfunction, unspecified: Secondary | ICD-10-CM | POA: Diagnosis not present

## 2019-06-28 DIAGNOSIS — C61 Malignant neoplasm of prostate: Secondary | ICD-10-CM

## 2019-06-28 DIAGNOSIS — R351 Nocturia: Secondary | ICD-10-CM

## 2019-06-28 DIAGNOSIS — N401 Enlarged prostate with lower urinary tract symptoms: Secondary | ICD-10-CM | POA: Diagnosis not present

## 2019-06-28 LAB — BLADDER SCAN AMB NON-IMAGING: Scan Result: 0

## 2019-06-28 MED ORDER — FINASTERIDE 5 MG PO TABS: 5.0000 mg | ORAL_TABLET | Freq: Every day | ORAL | 3 refills | Status: DC

## 2019-06-28 MED ORDER — SILDENAFIL CITRATE 20 MG PO TABS: 40.0000 mg | ORAL_TABLET | Freq: Every day | ORAL | 4 refills | Status: DC | PRN

## 2019-06-28 MED ORDER — TAMSULOSIN HCL 0.4 MG PO CAPS: 0.4000 mg | ORAL_CAPSULE | Freq: Every day | ORAL | 3 refills | Status: DC

## 2019-06-28 MED ORDER — MIRABEGRON ER 25 MG PO TB24: 25.0000 mg | ORAL_TABLET | Freq: Every day | ORAL | 0 refills | Status: DC

## 2019-07-26 NOTE — Progress Notes (Signed)
1:53 PM   Richard Alexander 1941-09-28 [id number]  Referring provider: Ezequiel Kayser, MD [facility] [place],  [place] [address]  Chief Complaint  Patient presents with  . 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    Prostate cancer He underwent previous prostate biopsy on 08/12/2018 for an elevated PSA rising on finasteride, 5.3 at the time of biopsy (10.6 adjusted).  He was also noted to have induration on the right side of his prostate gland on rectal exam.  Transrectal ultrasound volume 26 g.  Prostate biopsy showed 7/12 cores positive of Gleason 3+4 and 3+3 prostate cancer.  His primary tumor was on the right with 3 cores of Gleason 3+4 involving up to 86% of the tissue right apex and mid gland.  The remainder of the prostate showed low volume Gleason 3+3 dispersed throughout the prostate.  Patient had 1-125 prostate seeds implanted on 11/07/2018 with a follow-up on 12/05/18.  Has received 6 months Lupon in 12/2018.  Most recent PSA <0.1 on 06/26/2019.    BPH WITH LUTS  (prostate and/or bladder) IPSS score: 14/3    PVR: 17 mL    Previous score: 13/3   Previous PVR: 0 mL  Major complaint(s):  Frequency and nocturia x several months.  Denies any dysuria, hematuria or suprapubic pain.  He did not find the Myrbetriq effective in reducing his nocturia.  He states it was working for him the first few days, but it then quit.  He is still sleeping with his CPAP.    Currently taking: tamsulosin 0.4 mg and finasteride 5 mg daily   Denies any recent fevers, chills, nausea or vomiting.  IPSS    Row Name 07/27/19 1300         International Prostate Symptom Score   How often have you had the sensation of not emptying your bladder?  Almost always     How often have you had to urinate less than every two hours?  About half the time     How often have you found you stopped  and started again several times when you urinated?  Not at All     How often have you found it difficult to postpone urination?  Less than half the time     How often have you had a weak urinary stream?  Less than 1 in 5 times     How often have you had to strain to start urination?  Not at All     How many times did you typically get up at night to urinate?  3 Times     Total IPSS Score  14       Quality of Life due to urinary symptoms   If you were to spend the rest of your life with your urinary condition just the way it is now how would you feel about that?  Mixed        Score:  1-7 Mild 8-19 Moderate 20-35 Severe  PMH: Past Medical History:  Diagnosis Date  . Allergic rhinitis   . Arthritis   . DDD (degenerative disc disease), cervical   . Hypertension   . Presence of permanent cardiac pacemaker   . Sleep apnea    CPAP    Surgical History: Past Surgical History:  Procedure Laterality Date  . adenomatous polyp  06/08/2018  .  ANTERIOR CERVICAL DISCECTOMY  ?  Marland Kitchen BACK SURGERY    . COLONOSCOPY WITH PROPOFOL N/A 07/25/2018   Procedure: COLONOSCOPY WITH PROPOFOL;  Surgeon: Manya Silvas, MD;  Location[facility] ENDOSCOPY;  Service: Endoscopy;  Laterality: N/A;  . HAMMER TOE SURGERY Right 2007   2nd toe  . INSERT / REPLACE / REMOVE PACEMAKER    . LUMBAR LAMINECTOMY/DECOMPRESSION MICRODISCECTOMY Bilateral 10/28/2016   Procedure: LUMBAR LAMINECTOMY/DECOMPRESSION MICRODISCECTOMY 3 LEVELS;  Surgeon: Meade Maw, MD;  Location: [facility] ORS;  Service: Neurosurgery;  Laterality: Bilateral;  L3-4, L4-5,L5-S1 Laminectomies and bilateral foraminotomies  . PACEMAKER INSERTION  2011  . RADIOACTIVE SEED IMPLANT N/A 11/07/2018   Procedure: RADIOACTIVE SEED IMPLANT/BRACHYTHERAPY IMPLANT;  Surgeon: Hollice Espy, MD;  Location: [facility] ORS;  Service: Urology;  Laterality: N/A;    Home Medications:  Allergies as of 07/27/2019   No Known Allergies     Medication List       Accurate as  of July 27, 2019  1:53 PM. If you have any questions, ask your nurse or doctor.        acetaminophen 500 MG tablet Commonly known as: TYLENOL Take 1,000 mg by mouth every 6 (six) hours as needed (for pain.).   aspirin EC 81 MG tablet Take 81 mg by mouth daily.   Calcium 600-200 MG-UNIT tablet Take 1 tablet by mouth daily.   docusate sodium 100 MG capsule Commonly known as: COLACE Take 1 capsule (100 mg total) by mouth 2 (two) times daily.   fexofenadine-pseudoephedrine 180-240 MG 24 hr tablet Commonly known as: ALLEGRA-D 24 Take 1 tablet by mouth daily as needed (allergy).   finasteride 5 MG tablet Commonly known as: PROSCAR Take 1 tablet (5 mg total) by mouth daily.   fluocinonide cream 0.05 % Commonly known as: LIDEX Apply 1 application topically 2 (two) times daily as needed (for skin irritation).   hydrochlorothiazide 25 MG tablet Commonly known as: HYDRODIURIL Take 25 mg by mouth daily.   hydrocortisone 2.5 % cream Apply 1 application topically 2 (two) times daily as needed (for skin irritation).   mirabegron ER 25 MG Tb24 tablet Commonly known as: MYRBETRIQ Take 1 tablet (25 mg total) by mouth daily.   polyethylene glycol 17 g packet Commonly known as: MIRALAX / GLYCOLAX Take 17 g by mouth daily as needed (constipation.).   potassium chloride SA 20 MEQ tablet Commonly known as: K-DUR Take 20 mEq by mouth daily.   pravastatin 20 MG tablet Commonly known as: PRAVACHOL Take 20 mg by mouth every evening.   sildenafil 20 MG tablet Commonly known as: REVATIO Take 2-5 tablets (40-100 mg total) by mouth daily as needed (erectile dysfunction.).   tamsulosin 0.4 MG Caps capsule Commonly known as: FLOMAX Take 1 capsule (0.4 mg total) by mouth daily.   vitamin B-12 1000 MCG tablet Commonly known as: CYANOCOBALAMIN Take 1,000 mcg by mouth daily.       Allergies: No Known Allergies  Family History: Family History  Problem Relation Age of Onset  .  Hypertension Father   . Stroke Father   . Colon cancer Father   . Aortic aneurysm Mother   . Prostate cancer Neg Hx   . Kidney cancer Neg Hx   . Bladder Cancer Neg Hx     Social History:  reports that he quit smoking about 43 years ago. His smoking use included cigarettes. He has never used smokeless tobacco. He reports that he does not drink alcohol or use drugs.  ROS: UROLOGY Frequent Urination?:  Yes Hard to postpone urination?: No Burning/pain with urination?: No Get up at night to urinate?: Yes Leakage of urine?: No Urine stream starts and stops?: No Trouble starting stream?: No Do you have to strain to urinate?: No Blood in urine?: No Urinary tract infection?: No Sexually transmitted disease?: No Injury to kidneys or bladder?: No Painful intercourse?: No Weak stream?: No Erection problems?: Yes Penile pain?: No  Gastrointestinal Nausea?: No Vomiting?: No Indigestion/heartburn?: No Diarrhea?: No Constipation?: No  Constitutional Fever: No Night sweats?: No Weight loss?: No Fatigue?: No  Skin Skin rash/lesions?: No Itching?: No  Eyes Blurred vision?: No Double vision?: No  Ears/Nose/Throat Sore throat?: No Sinus problems?: No  Hematologic/Lymphatic Swollen glands?: No Easy bruising?: No  Cardiovascular Leg swelling?: No Chest pain?: No  Respiratory Cough?: No Shortness of breath?: No  Endocrine Excessive thirst?: No  Musculoskeletal Back pain?: No Joint pain?: No  Neurological Headaches?: No Dizziness?: No  Psychologic Depression?: No Anxiety?: No  Physical Exam: BP 126/74 (BP Location: Left Arm, Patient Position: Sitting, Cuff Size: Normal)   Pulse 80   Ht 5\' 3"  (1.6 m)   Wt 219 lb 9.6 oz (99.6 kg)   BMI 38.90 kg/m   Constitutional:  Well nourished. Alert and oriented, No acute distress. HEENT: [place] AT, moist mucus membranes.  Trachea midline, no masses. Cardiovascular: No clubbing, cyanosis, or edema. Respiratory: Normal  respiratory effort, no increased work of breathing. Neurologic: Grossly intact, no focal deficits, moving all 4 extremities. Psychiatric: Normal mood and affect.  Laboratory Data: Lab Results  Component Value Date   WBC 8.2 10/31/2018   HGB 13.3 10/31/2018   HCT 41.2 10/31/2018   MCV 94.3 10/31/2018   PLT 347 10/31/2018    Lab Results  Component Value Date   CREATININE 0.89 10/19/2016    No results found for: PSA  No results found for: TESTOSTERONE  No results found for: HGBA1C  No results found for: TSH  No results found for: CHOL, HDL, CHOLHDL, VLDL, LDLCALC  Lab Results  Component Value Date   AST 24 10/19/2016   Lab Results  Component Value Date   ALT 22 10/19/2016   No components found for: ALKALINEPHOPHATASE No components found for: BILIRUBINTOTAL  No results found for: ESTRADIOL  Urinalysis    Component Value Date/Time   COLORURINE YELLOW (A) 10/19/2016 1055   APPEARANCEUR CLEAR (A) 10/19/2016 1055   LABSPEC 1.013 10/19/2016 1055   PHURINE 6.0 10/19/2016 1055   GLUCOSEU NEGATIVE 10/19/2016 1055   HGBUR 1+ (A) 10/19/2016 1055   BILIRUBINUR NEGATIVE 10/19/2016 [place] 10/19/2016 1055   PROTEINUR NEGATIVE 10/19/2016 1055   NITRITE NEGATIVE 10/19/2016 [place] 10/19/2016 1055    I have reviewed the labs.  Pertinent Imaging Results for VINICIO, LYNK (MRN [id number]) as of 07/27/2019 13:58  Ref. Range 07/27/2019 13:54  Scan Result Unknown 17    Assessment & Plan:    1. Prostate Cancer [facility]) Gleason 3+4 intermediate risk disease, cT2 - seed implant 10/2018 with 6 months ADT PSA undetectable RTC in 6 months for PSA   2. BPH with obstruction/lower urinary tract symptoms IPSS score is 14/3, it is slightly worse Continue conservative management, avoiding bladder irritants and timed voiding's Most bothersome symptoms is/are nocturia Continue tamsulosin 0.4 mg daily and finasteride 5 mg daily RTC in 6 months  for IPSS, PSA, PVR and exam   3. Nocturia Patient is sleeping with a CPAP machine and is engaging in behavioral and dietary  modification Will have a trial of Myrbetriq 50 mg daily, #28 - will call if effective   4. Erectile dysfunction of organic origin Baseline erectile dysfunction, uses sildenafil as needed   Return for January for I PSS, SHIM, PSA and exam .  These notes generated with voice recognition software. I apologize for typographical errors.  Zara Council, PA-C    [facility] [address]  [place], [place] [address] [phone]

## 2019-07-27 ENCOUNTER — Ambulatory Visit (INDEPENDENT_AMBULATORY_CARE_PROVIDER_SITE_OTHER): Payer: Medicare Other | Admitting: Urology

## 2019-07-27 ENCOUNTER — Other Ambulatory Visit: Payer: Self-pay

## 2019-07-27 ENCOUNTER — Encounter: Payer: Self-pay | Admitting: Urology

## 2019-07-27 VITALS — BP 126/74 | HR 80 | Ht 63.0 in | Wt 219.6 lb

## 2019-07-27 DIAGNOSIS — N401 Enlarged prostate with lower urinary tract symptoms: Secondary | ICD-10-CM

## 2019-07-27 DIAGNOSIS — N138 Other obstructive and reflux uropathy: Secondary | ICD-10-CM | POA: Diagnosis not present

## 2019-07-27 DIAGNOSIS — R351 Nocturia: Secondary | ICD-10-CM | POA: Diagnosis not present

## 2019-07-27 LAB — BLADDER SCAN AMB NON-IMAGING: Scan Result: 17

## 2019-08-14 ENCOUNTER — Telehealth: Payer: Self-pay | Admitting: Urology

## 2019-08-14 DIAGNOSIS — R351 Nocturia: Secondary | ICD-10-CM

## 2019-08-14 MED ORDER — MIRABEGRON ER 25 MG PO TB24: 25.0000 mg | ORAL_TABLET | Freq: Every day | ORAL | 11 refills | Status: DC

## 2019-08-14 NOTE — Telephone Encounter (Signed)
Pt would like an rx for Myrbetriq 50 mg called into [facility]. He states that Larene Beach had given him samples and they are working well.

## 2019-08-14 NOTE — Telephone Encounter (Signed)
Rx sent in

## 2019-08-21 NOTE — Telephone Encounter (Signed)
Pt called and states that the Myrbetriq that was called in last week was supposed to be 50mg  and it was called in as 25mg . He also would like to know if there is a cheaper medication he could try. Please advise.

## 2019-08-21 NOTE — Telephone Encounter (Signed)
Spoke to patient and he already picked up the Myrbetriq 25 and is taking 2 tablets. He is asking if he can get a different medication that works like Mybetriq but is cheaper. I told him you are on vacation and we will call him next week. He has enough to last a couple weeks

## 2019-08-28 NOTE — Telephone Encounter (Signed)
Please let Richard Alexander know that there is no generic available for the Myrbetriq.  We would have to change medications to something in the anticholinergic family as they do have generics available.  He needs to be aware of the side effects of this group, such as: Dry eyes, dry mouth, constipation, mental confusion and/or urinary retention.  If he would like, we can send in a prescription for something in this group of medications.

## 2019-08-29 NOTE — Telephone Encounter (Signed)
Called pt no answer. Left detailed message per DPR informing pt of the options listed by [facility] below. Advised pt to call us back with a decision.

## 2019-08-30 NOTE — Telephone Encounter (Signed)
Please advise 

## 2019-08-30 NOTE — Telephone Encounter (Signed)
Pt called back and states that he is willing to try the meds that were discussed, If we have samples. If not he would like Myrbetriq called in.

## 2019-08-30 NOTE — Telephone Encounter (Signed)
We can give him some Toviaz 4 mg, #28 samples.

## 2019-08-30 NOTE — Telephone Encounter (Signed)
Called pt informed him [facility]. Pt gave verbal understanding.

## 2019-09-14 ENCOUNTER — Inpatient Hospital Stay: Payer: Medicare Other

## 2019-09-15 ENCOUNTER — Inpatient Hospital Stay: Payer: Medicare Other | Attending: Radiation Oncology

## 2019-09-15 ENCOUNTER — Other Ambulatory Visit: Payer: Self-pay

## 2019-09-15 DIAGNOSIS — C61 Malignant neoplasm of prostate: Secondary | ICD-10-CM

## 2019-09-15 LAB — PSA: Prostatic Specific Antigen: 0.13 ng/mL (ref 0.00–4.00)

## 2019-09-20 ENCOUNTER — Other Ambulatory Visit: Payer: Self-pay

## 2019-09-21 ENCOUNTER — Other Ambulatory Visit: Payer: Self-pay

## 2019-09-21 ENCOUNTER — Ambulatory Visit
Admission: RE | Admit: 2019-09-21 | Discharge: 2019-09-21 | Disposition: A | Discharge status: Home / Self Care | Payer: Medicare Other | Source: Ambulatory Visit | Attending: Radiation Oncology | Admitting: Radiation Oncology

## 2019-09-21 ENCOUNTER — Encounter: Payer: Self-pay | Admitting: Radiation Oncology

## 2019-09-21 VITALS — BP 116/71 | HR 91 | Temp 97.8°F | Wt 217.0 lb

## 2019-09-21 DIAGNOSIS — R351 Nocturia: Secondary | ICD-10-CM | POA: Diagnosis not present

## 2019-09-21 DIAGNOSIS — C61 Malignant neoplasm of prostate: Secondary | ICD-10-CM

## 2019-09-21 DIAGNOSIS — Z923 Personal history of irradiation: Secondary | ICD-10-CM | POA: Diagnosis not present

## 2019-09-21 NOTE — Progress Notes (Signed)
Radiation Oncology Follow up Note  Name: Richard Alexander   Date:   09/21/2019 MRN:  [id number] DOB: 1941-05-21    This 78 y.o. male presents to the clinic today for 69-month follow-up status post I-125 interstitial implant for Gleason 7 adenocarcinoma the prostate.  REFERRING PROVIDER: Ezequiel Kayser, MD  HPI: Patient is a 78 year old male now about 10 months having completed I-125 interstitial implant for Gleason 7 (3+4) of the prostate.  Seen today in routine follow-up he is doing well.  He specifically denies increased lower urinary tract symptoms diarrhea or fatigue.  His most recent PSA is 0.13..  His last PSA 6 months ago was 0.08.  He had a CT scan back in December to rule out metastatic disease showed no evidence of metastatic disease in his chest.  Patient is currently on Flomax and finasteride tolerating that well does have nocturia [id number]  COMPLICATIONS OF TREATMENT: none  FOLLOW UP COMPLIANCE: keeps appointments   PHYSICAL EXAM:  BP 116/71   Pulse 91   Temp 97.8 F (36.6 C)   Wt 217 lb (98.4 kg)   BMI 38.44 kg/m  Well-developed well-nourished patient in NAD. HEENT reveals PERLA, EOMI, discs not visualized.  Oral cavity is clear. No oral mucosal lesions are identified. Neck is clear without evidence of cervical or supraclavicular adenopathy. Lungs are clear to A&P. Cardiac examination is essentially unremarkable with regular rate and rhythm without murmur rub or thrill. Abdomen is benign with no organomegaly or masses noted. Motor sensory and DTR levels are equal and symmetric in the upper and lower extremities. Cranial nerves II through XII are grossly intact. Proprioception is intact. No peripheral adenopathy or edema is identified. No motor or sensory levels are noted. Crude visual fields are within normal range.  RADIOLOGY RESULTS: CT scan of the chest reviewed compatible with above-stated findings  PLAN: Present time patient is under excellent biochemical control of his  prostate cancer.  I am pleased with his overall progress.  I have asked to see him back in 1 year for follow-up.  He continues follow-up care with urology.  Patient knows to call with any concerns.  I would like to take this opportunity to thank you for allowing me to participate in the care of your patient.Noreene Filbert, MD

## 2019-12-18 ENCOUNTER — Other Ambulatory Visit: Payer: Self-pay

## 2019-12-18 DIAGNOSIS — C61 Malignant neoplasm of prostate: Secondary | ICD-10-CM

## 2019-12-19 ENCOUNTER — Other Ambulatory Visit: Payer: Self-pay

## 2019-12-19 ENCOUNTER — Other Ambulatory Visit: Payer: Medicare Other

## 2019-12-19 DIAGNOSIS — C61 Malignant neoplasm of prostate: Secondary | ICD-10-CM

## 2019-12-20 LAB — PSA: Prostate Specific Ag, Serum: <0.1 ng/mL (ref 0.0–4.0)

## 2019-12-26 NOTE — Progress Notes (Signed)
10:27 AM   Richard Alexander [birth year] [id number]  Referring provider: Ezequiel Kayser, MD [facility] [place],  [place] [address]  Chief Complaint  Patient presents with  . Prostate Cancer    HPI: Richard Alexander is a 79 y.o. male who presents today for a follow up on his prostate cancer.  Prostate cancer He underwent previous prostate biopsy on 08/12/2018 for an elevated PSA rising on finasteride, 5.3 at the time of biopsy (10.6 adjusted).  He was also noted to have induration on the right side of his prostate gland on rectal exam.  Transrectal ultrasound volume 26 g.  Prostate biopsy showed 7/12 cores positive of Gleason 3+4 and 3+3 prostate cancer.  His primary tumor was on the right with 3 cores of Gleason 3+4 involving up to 86% of the tissue right apex and mid gland.  The remainder of the prostate showed low volume Gleason 3+3 dispersed throughout the prostate.  Patient had 1-125 prostate seeds implanted on 11/07/2018 with a follow-up on 12/05/18.  Has received 6 months Lupon in 12/2018.  Most recent PSA <0.1 on 11/2019.    BPH WITH LUTS  (prostate and/or bladder) IPSS score: 4/1  Previous score: 13/3   Previous PVR: 0 mL  Major complaint(s):  Frequency and nocturia x 1-2.   Denies any dysuria, hematuria or suprapubic pain.   Currently taking: tamsulosin 0.4 mg and finasteride 5 mg daily  Toviaz was effective in controlling urinary symptoms.    Denies any recent fevers, chills, nausea or vomiting.  IPSS    Row Name 12/27/19 0900         International Prostate Symptom Score   How often have you had the sensation of not emptying your bladder?  Not at All     How often have you had to urinate less than every two hours?  Less than 1 in 5 times     How often have you found you stopped and started again several times when you urinated?  Not at All     How often have you found it difficult to postpone urination?  Less than half the time     How often  have you had a weak urinary stream?  Not at All     How often have you had to strain to start urination?  Not at All     How many times did you typically get up at night to urinate?  1 Time     Total IPSS Score  4       Quality of Life due to urinary symptoms   If you were to spend the rest of your life with your urinary condition just the way it is now how would you feel about that?  Pleased        Score:  1-7 Mild 8-19 Moderate 20-35 Severe  Erectile dysfunction SHIM score: 6     Risk factors:  age, pelvic radiation, prostate cancer and sleep apnea, CAD. No  painful erections or curvatures with his erections.    Still having having spontaneous erections Tried:  Sildenafil without success    SHIM    Row Name 12/27/19 0956         SHIM: Over the last 6 months:   How do you rate your confidence that you could get and keep an erection?  Low     When you had erections with sexual stimulation, how often were your erections hard enough  for penetration (entering your partner)?  Almost Never or Never     During sexual intercourse, how often were you able to maintain your erection after you had penetrated (entered) your partner?  Almost Never or Never     During sexual intercourse, how difficult was it to maintain your erection to completion of intercourse?  Extremely Difficult     When you attempted sexual intercourse, how often was it satisfactory for you?  Almost Never or Never       SHIM Total Score   SHIM  6        Score: 1-7 Severe ED 8-11 Moderate ED 12-16 Mild-Moderate ED 17-21 Mild ED 22-25 No ED    PMH: Past Medical History:  Diagnosis Date  . Allergic rhinitis   . Arthritis   . DDD (degenerative disc disease), cervical   . Hypertension   . Presence of permanent cardiac pacemaker   . Sleep apnea    CPAP    Surgical History: Past Surgical History:  Procedure Laterality Date  . adenomatous polyp  06/08/2018  . ANTERIOR CERVICAL DISCECTOMY  ?  Marland Kitchen BACK  SURGERY    . COLONOSCOPY WITH PROPOFOL N/A 07/25/2018   Procedure: COLONOSCOPY WITH PROPOFOL;  Surgeon: Manya Silvas, MD;  Location: [facility] ENDOSCOPY;  Service: Endoscopy;  Laterality: N/A;  . HAMMER TOE SURGERY Right 2007   2nd toe  . INSERT / REPLACE / REMOVE PACEMAKER    . LUMBAR LAMINECTOMY/DECOMPRESSION MICRODISCECTOMY Bilateral 10/28/2016   Procedure: LUMBAR LAMINECTOMY/DECOMPRESSION MICRODISCECTOMY 3 LEVELS;  Surgeon: Meade Maw, MD;  Location: [facility] ORS;  Service: Neurosurgery;  Laterality: Bilateral;  L3-4, L4-5,L5-S1 Laminectomies and bilateral foraminotomies  . PACEMAKER INSERTION  2011  . RADIOACTIVE SEED IMPLANT N/A 11/07/2018   Procedure: RADIOACTIVE SEED IMPLANT/BRACHYTHERAPY IMPLANT;  Surgeon: Hollice Espy, MD;  Location: [facility] ORS;  Service: Urology;  Laterality: N/A;    Home Medications:  Allergies as of 12/27/2019   No Known Allergies     Medication List       Accurate as of December 27, 2019 10:27 AM. If you have any questions, ask your nurse or doctor.        acetaminophen 500 MG tablet Commonly known as: TYLENOL Take 1,000 mg by mouth every 6 (six) hours as needed (for pain.).   aspirin EC 81 MG tablet Take 81 mg by mouth daily.   Calcium 600-200 MG-UNIT tablet Take 1 tablet by mouth daily.   docusate sodium 100 MG capsule Commonly known as: COLACE Take 1 capsule (100 mg total) by mouth 2 (two) times daily.   fesoterodine 4 MG Tb24 tablet Commonly known as: TOVIAZ Take 1 tablet (4 mg total) by mouth daily. Started by: Zara Council, PA-C   fexofenadine-pseudoephedrine 180-240 MG 24 hr tablet Commonly known as: ALLEGRA-D 24 Take 1 tablet by mouth daily as needed (allergy).   finasteride 5 MG tablet Commonly known as: PROSCAR Take 1 tablet (5 mg total) by mouth daily.   fluocinonide cream 0.05 % Commonly known as: LIDEX Apply 1 application topically 2 (two) times daily as needed (for skin irritation).   hydrochlorothiazide 25 MG  tablet Commonly known as: HYDRODIURIL Take 25 mg by mouth daily.   hydrocortisone 2.5 % cream Apply 1 application topically 2 (two) times daily as needed (for skin irritation).   mirabegron ER 25 MG Tb24 tablet Commonly known as: MYRBETRIQ Take 1 tablet (25 mg total) by mouth daily.   polyethylene glycol 17 g packet Commonly known as: MIRALAX / GLYCOLAX Take  17 g by mouth daily as needed (constipation.).   potassium chloride SA 20 MEQ tablet Commonly known as: KLOR-CON Take 20 mEq by mouth daily.   pravastatin 20 MG tablet Commonly known as: PRAVACHOL Take 20 mg by mouth every evening.   sildenafil 20 MG tablet Commonly known as: REVATIO Take 2-5 tablets (40-100 mg total) by mouth daily as needed (erectile dysfunction.).   tadalafil 20 MG tablet Commonly known as: CIALIS Take 1 tablet (20 mg total) by mouth daily as needed for erectile dysfunction. Started by: Zara Council, PA-C   tamsulosin 0.4 MG Caps capsule Commonly known as: FLOMAX Take 1 capsule (0.4 mg total) by mouth daily.   vitamin B-12 1000 MCG tablet Commonly known as: CYANOCOBALAMIN Take 1,000 mcg by mouth daily.       Allergies: No Known Allergies  Family History: Family History  Problem Relation Age of Onset  . Hypertension Father   . Stroke Father   . Colon cancer Father   . Aortic aneurysm Mother   . Prostate cancer Neg Hx   . Kidney cancer Neg Hx   . Bladder Cancer Neg Hx     Social History:  reports that he quit smoking about 44 years ago. His smoking use included cigarettes. He has never used smokeless tobacco. He reports that he does not drink alcohol or use drugs.  ROS: UROLOGY Frequent Urination?: Yes Hard to postpone urination?: No Burning/pain with urination?: No Get up at night to urinate?: Yes Leakage of urine?: No Urine stream starts and stops?: No Trouble starting stream?: No Do you have to strain to urinate?: No Blood in urine?: No Urinary tract infection?:  No Sexually transmitted disease?: No Injury to kidneys or bladder?: No Painful intercourse?: No Weak stream?: No Erection problems?: Yes Penile pain?: No  Gastrointestinal Nausea?: No Vomiting?: No Indigestion/heartburn?: No Diarrhea?: No Constipation?: No  Constitutional Fever: No Night sweats?: No Weight loss?: No Fatigue?: No  Skin Skin rash/lesions?: No Itching?: No  Eyes Blurred vision?: No Double vision?: No  Ears/Nose/Throat Sore throat?: No Sinus problems?: No  Hematologic/Lymphatic Swollen glands?: No Easy bruising?: No  Cardiovascular Leg swelling?: No Chest pain?: No  Respiratory Cough?: No Shortness of breath?: No  Endocrine Excessive thirst?: No  Musculoskeletal Back pain?: No Joint pain?: No  Neurological Headaches?: No Dizziness?: No  Psychologic Depression?: No Anxiety?: No  Physical Exam: BP (!) 143/71   Pulse 78   Ht 5\' 3"  (1.6 m)   Wt 221 lb 14.4 oz (100.7 kg)   BMI 39.31 kg/m   Constitutional:  Well nourished. Alert and oriented, No acute distress. HEENT: [place] AT, moist mucus membranes.  Trachea midline, no masses. Cardiovascular: No clubbing, cyanosis, or edema. Respiratory: Normal respiratory effort, no increased work of breathing. Neurologic: Grossly intact, no focal deficits, moving all 4 extremities. Psychiatric: Normal mood and affect.  Laboratory Data: Lab Results  Component Value Date   WBC 8.2 10/31/2018   HGB 13.3 10/31/2018   HCT 41.2 10/31/2018   MCV 94.3 10/31/2018   PLT 347 10/31/2018    Lab Results  Component Value Date   CREATININE 0.89 10/19/2016    No results found for: PSA  No results found for: TESTOSTERONE  No results found for: HGBA1C  No results found for: TSH  No results found for: CHOL, HDL, CHOLHDL, VLDL, LDLCALC  Lab Results  Component Value Date   AST 24 10/19/2016   Lab Results  Component Value Date   ALT 22 10/19/2016   No  components found for:  ALKALINEPHOPHATASE No components found for: BILIRUBINTOTAL  No results found for: ESTRADIOL  Urinalysis    Component Value Date/Time   COLORURINE YELLOW (A) 10/19/2016 1055   APPEARANCEUR CLEAR (A) 10/19/2016 1055   LABSPEC 1.013 10/19/2016 1055   PHURINE 6.0 10/19/2016 1055   GLUCOSEU NEGATIVE 10/19/2016 1055   HGBUR 1+ (A) 10/19/2016 1055   BILIRUBINUR NEGATIVE 10/19/2016 [place] 10/19/2016 1055   PROTEINUR NEGATIVE 10/19/2016 1055   NITRITE NEGATIVE 10/19/2016 1055   LEUKOCYTESUR NEGATIVE 10/19/2016 1055    I have reviewed the labs.  Pertinent Imaging Results for SHALOM, GRANDE (MRN [id number]) as of 06/28/2019 11:34  Ref. Range 06/28/2019 11:30  Scan Result Unknown 0     Assessment & Plan:    1. Prostate Cancer [facility]) Gleason 3+4 intermediate risk disease, cT2 - seed implant 10/2018 with 6 months ADT PSA undetectable RTC in 6 months for PSA   2. BPH with obstruction/lower urinary tract symptoms IPSS score is 4/1, it is improved Continue conservative management, avoiding bladder irritants and timed voiding's Most bothersome symptoms is/are nocturia Continue tamsulosin 0.4 mg daily - refills given Discontinue finasteride  Continue Toviaz 4 mg daily - refills given RTC in 6 months for IPSS, PSA, PVR and exam   3. Nocturia Patient is sleeping with a CPAP machine and is engaging in behavioral and dietary modification Continue Toviax 4 mg daily RTC in 6 months for I PSS and PVR  4. Erectile dysfunction of organic origin SHIM score is 6 We discussed trying a different PDE5 inhibitor - tadalafil 20 mg sent to [facility] RTC in 6 months for repeat SHIM score and exam     Return in about 6 months (around 06/25/2020) for IPSS, SHIM, PSA and exam.  These notes generated with voice recognition software. I apologize for typographical errors.  Zara Council, PA-C    [facility] [address]  [place], [place] [address] [phone]

## 2019-12-27 ENCOUNTER — Ambulatory Visit: Payer: Medicare PPO | Admitting: Urology

## 2019-12-27 ENCOUNTER — Other Ambulatory Visit: Payer: Self-pay

## 2019-12-27 ENCOUNTER — Encounter: Payer: Self-pay | Admitting: Urology

## 2019-12-27 VITALS — BP 143/71 | HR 78 | Ht 63.0 in | Wt 221.9 lb

## 2019-12-27 DIAGNOSIS — R351 Nocturia: Secondary | ICD-10-CM

## 2019-12-27 DIAGNOSIS — R399 Unspecified symptoms and signs involving the genitourinary system: Secondary | ICD-10-CM

## 2019-12-27 DIAGNOSIS — C61 Malignant neoplasm of prostate: Secondary | ICD-10-CM | POA: Diagnosis not present

## 2019-12-27 DIAGNOSIS — N529 Male erectile dysfunction, unspecified: Secondary | ICD-10-CM | POA: Diagnosis not present

## 2019-12-27 MED ORDER — TADALAFIL 20 MG PO TABS: 20.0000 mg | ORAL_TABLET | Freq: Every day | ORAL | 3 refills | Status: DC | PRN

## 2019-12-27 MED ORDER — TAMSULOSIN HCL 0.4 MG PO CAPS: 0.4000 mg | ORAL_CAPSULE | Freq: Every day | ORAL | 3 refills | Status: DC

## 2019-12-27 MED ORDER — FESOTERODINE FUMARATE ER 4 MG PO TB24: 4.0000 mg | ORAL_TABLET | Freq: Every day | ORAL | 3 refills | Status: DC

## 2019-12-27 NOTE — Patient Instructions (Signed)
Stop the finasteride (Proscar)

## 2020-03-28 ENCOUNTER — Inpatient Hospital Stay: Payer: Medicare PPO | Attending: Radiation Oncology

## 2020-03-28 ENCOUNTER — Other Ambulatory Visit: Payer: Self-pay

## 2020-03-28 DIAGNOSIS — C61 Malignant neoplasm of prostate: Secondary | ICD-10-CM | POA: Diagnosis present

## 2020-03-28 LAB — PSA: Prostatic Specific Antigen: 0.2 ng/mL (ref 0.00–4.00)

## 2020-04-03 ENCOUNTER — Other Ambulatory Visit: Payer: Self-pay

## 2020-04-04 ENCOUNTER — Ambulatory Visit
Admission: RE | Admit: 2020-04-04 | Discharge: 2020-04-04 | Disposition: A | Discharge status: Home / Self Care | Payer: Medicare PPO | Source: Ambulatory Visit | Attending: Radiation Oncology | Admitting: Radiation Oncology

## 2020-04-04 VITALS — BP 139/77 | HR 86 | Temp 96.6°F | Resp 16 | Wt 225.3 lb

## 2020-04-04 DIAGNOSIS — Z923 Personal history of irradiation: Secondary | ICD-10-CM | POA: Insufficient documentation

## 2020-04-04 DIAGNOSIS — C61 Malignant neoplasm of prostate: Secondary | ICD-10-CM | POA: Diagnosis present

## 2020-04-04 NOTE — Progress Notes (Signed)
Radiation Oncology Follow up Note  Name: Richard Alexander   Date:   04/04/2020 MRN:  [id number] DOB: Nov 23, 1941    This 79 y.o. male presents to the clinic today for 1-1/2-year follow-up status post I-125 interstitial implant for Gleason 7 adenocarcinoma the prostate.  REFERRING PROVIDER: Ezequiel Kayser, MD  HPI: Patient is a 79 year old male now at 1 and half years having completed I-125 interstitial implant for Gleason 7 (3+4) adenocarcinoma the prostate.  Seen today in routine follow-up he is doing well.  He specifically denies any increased lower urinary tract symptoms diarrhea or fatigue.  Most recent PSA is 0.20.Marland Kitchen  COMPLICATIONS OF TREATMENT: none  FOLLOW UP COMPLIANCE: keeps appointments   PHYSICAL EXAM:  BP 139/77 (BP Location: Left Arm)   Pulse 86   Temp (!) 96.6 F (35.9 C) (Tympanic)   Resp 16   Wt 225 lb 4.8 oz (102.2 kg)   BMI 39.91 kg/m  Well-developed well-nourished patient in NAD. HEENT reveals PERLA, EOMI, discs not visualized.  Oral cavity is clear. No oral mucosal lesions are identified. Neck is clear without evidence of cervical or supraclavicular adenopathy. Lungs are clear to A&P. Cardiac examination is essentially unremarkable with regular rate and rhythm without murmur rub or thrill. Abdomen is benign with no organomegaly or masses noted. Motor sensory and DTR levels are equal and symmetric in the upper and lower extremities. Cranial nerves II through XII are grossly intact. Proprioception is intact. No peripheral adenopathy or edema is identified. No motor or sensory levels are noted. Crude visual fields are within normal range.  RADIOLOGY RESULTS: No current films to review  PLAN: Present time patient is under excellent biochemical control of his prostate cancer.  He has a extremely low side effect profile.  And pleased with his overall progress.  Of asked to see him back in 1 year for follow-up with a PSA prior to that visit.  Patient is to call with any  concerns.  I would like to take this opportunity to thank you for allowing me to participate in the care of your patient.Noreene Filbert, MD

## 2020-05-28 ENCOUNTER — Other Ambulatory Visit: Payer: Self-pay

## 2020-05-28 ENCOUNTER — Ambulatory Visit (INDEPENDENT_AMBULATORY_CARE_PROVIDER_SITE_OTHER): Payer: Medicare PPO | Admitting: Vascular Surgery

## 2020-05-28 ENCOUNTER — Encounter (INDEPENDENT_AMBULATORY_CARE_PROVIDER_SITE_OTHER): Payer: Self-pay | Admitting: Vascular Surgery

## 2020-05-28 VITALS — BP 145/77 | HR 78 | Resp 16 | Ht 63.0 in | Wt 218.0 lb

## 2020-05-28 DIAGNOSIS — E785 Hyperlipidemia, unspecified: Secondary | ICD-10-CM | POA: Diagnosis not present

## 2020-05-28 DIAGNOSIS — I10 Essential (primary) hypertension: Secondary | ICD-10-CM

## 2020-05-28 DIAGNOSIS — I739 Peripheral vascular disease, unspecified: Secondary | ICD-10-CM | POA: Diagnosis not present

## 2020-05-28 NOTE — Progress Notes (Signed)
Patient ID: MAJOR SANTERRE, male   DOB: 1941/07/22, 79 y.o.   MRN: [id number]  Chief Complaint  Patient presents with  . New Patient (Initial Visit)    ref Thies ble claudication    HPI QUANTA ROHER is a 79 y.o. male.  I am asked to see the patient by Dr. Raechel Ache for evaluation of claudication symptoms of the lower extremity.  The patient does have a history of back disease and has had a previous back surgery.  He has previously described radicular symptoms going down the leg.  The symptoms he is having now is of tiredness and weakness in his legs.  This is associated with some cramping and some burning with activity.  This starts up in the hip and buttock area and radiates down the thighs.  This can happen after short distances of walking.  It seems to affect the left leg a little more than the right leg.  He denies any previous history of DVT or superficial thrombophlebitis to his knowledge.  No open wounds or infection.  There is not a clear inciting event or causative factor that started the symptoms.  There is not a specific thing that makes it significantly better.     Past Medical History:  Diagnosis Date  . Allergic rhinitis   . Arthritis   . DDD (degenerative disc disease), cervical   . Hypertension   . Presence of permanent cardiac pacemaker   . Sleep apnea    CPAP    Past Surgical History:  Procedure Laterality Date  . adenomatous polyp  06/08/2018  . ANTERIOR CERVICAL DISCECTOMY  ?  Marland Kitchen BACK SURGERY    . COLONOSCOPY WITH PROPOFOL N/A 07/25/2018   Procedure: COLONOSCOPY WITH PROPOFOL;  Surgeon: Manya Silvas, MD;  Location: [facility] ENDOSCOPY;  Service: Endoscopy;  Laterality: N/A;  . HAMMER TOE SURGERY Right 2007   2nd toe  . INSERT / REPLACE / REMOVE PACEMAKER    . LUMBAR LAMINECTOMY/DECOMPRESSION MICRODISCECTOMY Bilateral 10/28/2016   Procedure: LUMBAR LAMINECTOMY/DECOMPRESSION MICRODISCECTOMY 3 LEVELS;  Surgeon: Meade Maw, MD;  Location: [facility] ORS;  Service:  Neurosurgery;  Laterality: Bilateral;  L3-4, L4-5,L5-S1 Laminectomies and bilateral foraminotomies  . PACEMAKER INSERTION  2011  . RADIOACTIVE SEED IMPLANT N/A 11/07/2018   Procedure: RADIOACTIVE SEED IMPLANT/BRACHYTHERAPY IMPLANT;  Surgeon: Hollice Espy, MD;  Location: [facility] ORS;  Service: Urology;  Laterality: N/A;     Family History  Problem Relation Age of Onset  . Hypertension Father   . Stroke Father   . Colon cancer Father   . Aortic aneurysm Mother   . Prostate cancer Neg Hx   . Kidney cancer Neg Hx   . Bladder Cancer Neg Hx       Social History   Tobacco Use  . Smoking status: Former Smoker    Types: Cigarettes    Quit date: 10/20/1975    Years since quitting: 44.6  . Smokeless tobacco: Never Used  Substance Use Topics  . Alcohol use: No  . Drug use: No    No Known Allergies  Current Outpatient Medications  Medication Sig Dispense Refill  . acetaminophen (TYLENOL) 500 MG tablet Take 1,000 mg by mouth every 6 (six) hours as needed (for pain.).    Marland Kitchen aspirin EC 81 MG tablet Take 81 mg by mouth daily.    . Calcium 600-200 MG-UNIT tablet Take 1 tablet by mouth daily.    Marland Kitchen docusate sodium (COLACE) 100 MG capsule Take 1 capsule (100 mg total)  by mouth 2 (two) times daily. 60 capsule 0  . fexofenadine-pseudoephedrine (ALLEGRA-D 24) 180-240 MG 24 hr tablet Take 1 tablet by mouth daily as needed (allergy).     . hydrochlorothiazide (HYDRODIURIL) 25 MG tablet Take 25 mg by mouth daily.    . polyethylene glycol (MIRALAX / GLYCOLAX) packet Take 17 g by mouth daily as needed (constipation.).     Marland Kitchen potassium chloride SA (K-DUR,KLOR-CON) 20 MEQ tablet Take 20 mEq by mouth daily.     . pravastatin (PRAVACHOL) 20 MG tablet Take 20 mg by mouth every evening.     . tadalafil (CIALIS) 20 MG tablet Take 1 tablet (20 mg total) by mouth daily as needed for erectile dysfunction. 30 tablet 3  . tamsulosin (FLOMAX) 0.4 MG CAPS capsule Take 1 capsule (0.4 mg total) by mouth daily. 90  capsule 3  . vitamin B-12 (CYANOCOBALAMIN) 1000 MCG tablet Take 1,000 mcg by mouth daily.    . fesoterodine (TOVIAZ) 4 MG TB24 tablet Take 1 tablet (4 mg total) by mouth daily. (Patient not taking: Reported on 05/28/2020) 90 tablet 3  . finasteride (PROSCAR) 5 MG tablet Take 1 tablet (5 mg total) by mouth daily. (Patient not taking: Reported on 05/28/2020) 90 tablet 3  . fluocinonide cream (LIDEX) 7.25 % Apply 1 application topically 2 (two) times daily as needed (for skin irritation).    . hydrocortisone 2.5 % cream Apply 1 application topically 2 (two) times daily as needed (for skin irritation).    . mirabegron ER (MYRBETRIQ) 25 MG TB24 tablet Take 1 tablet (25 mg total) by mouth daily. (Patient not taking: Reported on 05/28/2020) 30 tablet 11  . sildenafil (REVATIO) 20 MG tablet Take 2-5 tablets (40-100 mg total) by mouth daily as needed (erectile dysfunction.). (Patient not taking: Reported on 05/28/2020) 10 tablet 4   No current facility-administered medications for this visit.      REVIEW OF SYSTEMS (Negative unless checked)  Constitutional: [] Weight loss  [] Fever  [] Chills Cardiac: [] Chest pain   [] Chest pressure   [] Palpitations   [] Shortness of breath when laying flat   [] Shortness of breath at rest   [] Shortness of breath with exertion. Vascular:  [x] Pain in legs with walking   [] Pain in legs at rest   [] Pain in legs when laying flat   [x] Claudication   [] Pain in feet when walking  [] Pain in feet at rest  [] Pain in feet when laying flat   [] History of DVT   [] Phlebitis   [] Swelling in legs   [] Varicose veins   [] Non-healing ulcers Pulmonary:   [] Uses home oxygen   [] Productive cough   [] Hemoptysis   [] Wheeze  [] COPD   [] Asthma Neurologic:  [] Dizziness  [] Blackouts   [] Seizures   [] History of stroke   [] History of TIA  [] Aphasia   [] Temporary blindness   [] Dysphagia   [] Weakness or numbness in arms   [] Weakness or numbness in legs Musculoskeletal:  [x] Arthritis   [] Joint swelling   [] Joint  pain   [x] Low back pain Hematologic:  [] Easy bruising  [] Easy bleeding   [] Hypercoagulable state   [] Anemic  [] Hepatitis Gastrointestinal:  [] Blood in stool   [] Vomiting blood  [x] Gastroesophageal reflux/heartburn   [] Abdominal pain Genitourinary:  [] Chronic kidney disease   [] Difficult urination  [] Frequent urination  [] Burning with urination   [] Hematuria Skin:  [] Rashes   [] Ulcers   [] Wounds Psychological:  [] History of anxiety   []  History of major depression.    Physical Exam BP (!) 145/77 (BP Location: Left  Arm)   Pulse 78   Resp 16   Ht 5\' 3"  (1.6 m)   Wt 218 lb (98.9 kg)   BMI 38.62 kg/m  Gen:  WD/WN, NAD.  Appears younger than stated age Head: [place]/AT, No temporalis wasting.  Ear/Nose/Throat: Hearing grossly intact, nares w/o erythema or drainage, oropharynx w/o Erythema/Exudate Eyes: Conjunctiva clear, sclera non-icteric  Neck: trachea midline.  No JVD.  Pulmonary:  Good air movement, respirations not labored, no use of accessory muscles  Cardiac: RRR, no JVD Vascular:  Vessel Right Left  Radial Palpable Palpable                          DP  2+  1+  PT  1+  1+   Gastrointestinal:. No masses, surgical incisions, or scars. Musculoskeletal: M/S 5/5 throughout.  Extremities without ischemic changes.  No deformity or atrophy.  No significant lower extremity edema. Neurologic: Sensation grossly intact in extremities.  Symmetrical.  Speech is fluent. Motor exam as listed above. Psychiatric: Judgment intact, Mood & affect appropriate for pt's clinical situation. Dermatologic: No rashes or ulcers noted.  No cellulitis or open wounds.    Radiology No results found.  Labs Recent Results (from the past 2160 hour(s))  PSA     Status: None   Collection Time: 03/28/20  8:47 AM  Result Value Ref Range   Prostatic Specific Antigen 0.20 0.00 - 4.00 ng/mL    Comment: (NOTE) While PSA levels of <=4.0 ng/ml are reported as reference range, some men with levels below 4.0  ng/ml can have prostate cancer and many men with PSA above 4.0 ng/ml do not have prostate cancer.  Other tests such as free PSA, age specific reference ranges, PSA velocity and PSA doubling time may be helpful especially in men less than 70 years old. Performed at [facility], [place] [address], [place], [place] [address]     Assessment/Plan:  Benign essential hypertension blood pressure control important in reducing the progression of atherosclerotic disease. On appropriate oral medications.   Hyperlipidemia lipid control important in reducing the progression of atherosclerotic disease. Continue statin therapy   Claudication [facility]) The patient describes symptoms consistent with claudication of the lower extremities.  We had a long discussion today about the pathophysiology and natural history of peripheral arterial disease.  We discussed that symptoms can be related to arterial insufficiency but also could be neurogenic claudication from his back disease.  I am going to order an aortoiliac and ABIs to assess for aortoiliac disease or poor perfusion distally that could explain his symptoms.  If these are normal, I will defer further work-up to his primary care physician or his neurosurgeon to determine further treatment options are necessary.  He will return with his noninvasive studies in the near future at his convenience.  He should continue his aspirin and statin agent which would be appropriate medical treatment for PAD if present.      Leotis Pain 05/28/2020, 2:53 PM   This note was created with Dragon medical transcription system.  Any errors from dictation are unintentional.

## 2020-05-28 NOTE — Assessment & Plan Note (Signed)
The patient describes symptoms consistent with claudication of the lower extremities.  We had a long discussion today about the pathophysiology and natural history of peripheral arterial disease.  We discussed that symptoms can be related to arterial insufficiency but also could be neurogenic claudication from his back disease.  I am going to order an aortoiliac and ABIs to assess for aortoiliac disease or poor perfusion distally that could explain his symptoms.  If these are normal, I will defer further work-up to his primary care physician or his neurosurgeon to determine further treatment options are necessary.  He will return with his noninvasive studies in the near future at his convenience.  He should continue his aspirin and statin agent which would be appropriate medical treatment for PAD if present.

## 2020-05-28 NOTE — Patient Instructions (Signed)
Peripheral Vascular Disease  Peripheral vascular disease (PVD) is a disease of the blood vessels that are not part of your heart and brain. A simple term for PVD is poor circulation. In most cases, PVD narrows the blood vessels that carry blood from your heart to the rest of your body. This can reduce the supply of blood to your arms, legs, and internal organs, like your stomach or kidneys. However, PVD most often affects a person's lower legs and feet. Without treatment, PVD tends to get worse. PVD can also lead to acute ischemic limb. This is when an arm or leg suddenly cannot get enough blood. This is a medical emergency. Follow these instructions at home: Lifestyle  Do not use any products that contain nicotine or tobacco, such as cigarettes and e-cigarettes. If you need help quitting, ask your doctor.  Lose weight if you are overweight. Or, stay at a healthy weight as told by your doctor.  Eat a diet that is low in fat and cholesterol. If you need help, ask your doctor.  Exercise regularly. Ask your doctor for activities that are right for you. General instructions  Take over-the-counter and prescription medicines only as told by your doctor.  Take good care of your feet: ? Wear comfortable shoes that fit well. ? Check your feet often for any cuts or sores.  Keep all follow-up visits as told by your doctor This is important. Contact a doctor if:  You have cramps in your legs when you walk.  You have leg pain when you are at rest.  You have coldness in a leg or foot.  Your skin changes.  You are unable to get or have an erection (erectile dysfunction).  You have cuts or sores on your feet that do not heal. Get help right away if:  Your arm or leg turns cold, numb, and blue.  Your arms or legs become red, warm, swollen, painful, or numb.  You have chest pain.  You have trouble breathing.  You suddenly have weakness in your face, arm, or leg.  You become very  confused or you cannot speak.  You suddenly have a very bad headache.  You suddenly cannot see. Summary  Peripheral vascular disease (PVD) is a disease of the blood vessels.  A simple term for PVD is poor circulation. Without treatment, PVD tends to get worse.  Treatment may include exercise, low fat and low cholesterol diet, and quitting smoking. This information is not intended to replace advice given to you by your health care provider. Make sure you discuss any questions you have with your health care provider. Document Revised: 11/19/2017 Document Reviewed: 01/14/2017 Elsevier Patient Education  2020 Elsevier Inc.  

## 2020-05-28 NOTE — Assessment & Plan Note (Signed)
blood pressure control important in reducing the progression of atherosclerotic disease. On appropriate oral medications.  

## 2020-05-28 NOTE — Assessment & Plan Note (Signed)
lipid control important in reducing the progression of atherosclerotic disease. Continue statin therapy  

## 2020-06-12 ENCOUNTER — Ambulatory Visit (INDEPENDENT_AMBULATORY_CARE_PROVIDER_SITE_OTHER): Payer: Medicare PPO

## 2020-06-12 ENCOUNTER — Other Ambulatory Visit: Payer: Self-pay

## 2020-06-12 ENCOUNTER — Encounter (INDEPENDENT_AMBULATORY_CARE_PROVIDER_SITE_OTHER): Payer: Self-pay | Admitting: Nurse Practitioner

## 2020-06-12 ENCOUNTER — Ambulatory Visit (INDEPENDENT_AMBULATORY_CARE_PROVIDER_SITE_OTHER): Payer: Medicare PPO | Admitting: Nurse Practitioner

## 2020-06-12 VITALS — BP 158/81 | HR 84 | Resp 16 | Wt 216.2 lb

## 2020-06-12 DIAGNOSIS — I739 Peripheral vascular disease, unspecified: Secondary | ICD-10-CM

## 2020-06-12 DIAGNOSIS — M48062 Spinal stenosis, lumbar region with neurogenic claudication: Secondary | ICD-10-CM | POA: Diagnosis not present

## 2020-06-12 DIAGNOSIS — E785 Hyperlipidemia, unspecified: Secondary | ICD-10-CM

## 2020-06-12 NOTE — Progress Notes (Signed)
Subjective:    Patient ID: Richard Alexander, male    DOB: 01/21/1941, 79 y.o.   MRN: [id number] Chief Complaint  Patient presents with  . Follow-up    ultrasound follow up    Richard Alexander is a 79 y.o. male.  The patient returns to the office today for evaluation of claudication symptoms.  The patient does have a previous history of surgical intervention on his lumbar back.  The patient notes that he had numbness going down his left leg due to a "pinched nerve".  The patient now describes a weakness/tiredness in his legs with ambulation.  He also notes that there is a tightness and tiredness in his lower back area.  He notes that the cramping starts in his buttocks and radiates down his legs with ambulation.  Left is slightly worse than the right.  He has had no prior history of DVT or superficial thrombophlebitis.  He is unsure of exactly how long has been ongoing but he cannot identify any specific cause.  He notes that resting with ambulation makes it better.   Today noninvasive studies show an ABI of 0.98 on the right and 1.12 on the left.  The patient has triphasic tibial artery waveforms with good toe waveforms bilaterally.  The patient has a toe pressure of 132 on the right and 120 on the left.  The patient also underwent an aortoiliac duplex which shows no evidence of an abdominal aortic aneurysm.  There is mild atherosclerosis however there is no evidence of significant stenosis.  No evidence of significant stenosis in either the aorta or iliac arteries.  No evidence of abnormal dilatation of the iliac arteries.   Review of Systems  Neurological:       Claudication   All other systems reviewed and are negative.      Objective:   Physical Exam Vitals reviewed.  HENT:     Head: Normocephalic.  Cardiovascular:     Rate and Rhythm: Normal rate and regular rhythm.     Pulses: Normal pulses.     Heart sounds: Normal heart sounds.  Pulmonary:     Effort: Pulmonary effort is  normal.     Breath sounds: Normal breath sounds.  Neurological:     Mental Status: He is alert and oriented to person, place, and time.  Psychiatric:        Mood and Affect: Mood normal.        Behavior: Behavior normal.        Thought Content: Thought content normal.        Judgment: Judgment normal.     BP (!) 158/81 (BP Location: Left Arm)   Pulse 84   Resp 16   Wt 216 lb 3.2 oz (98.1 kg)   BMI 38.30 kg/m   Past Medical History:  Diagnosis Date  . Allergic rhinitis   . Arthritis   . DDD (degenerative disc disease), cervical   . Hypertension   . Presence of permanent cardiac pacemaker   . Sleep apnea    CPAP    Social History   Socioeconomic History  . Marital status: Widowed    Spouse name: Not on file  . Number of children: Not on file  . Years of education: Not on file  . Highest education level: Not on file  Occupational History  . Not on file  Tobacco Use  . Smoking status: Former Smoker    Types: Cigarettes    Quit date: 10/20/1975  Years since quitting: 44.6  . Smokeless tobacco: Never Used  Vaping Use  . Vaping Use: Never used  Substance and Sexual Activity  . Alcohol use: No  . Drug use: No  . Sexual activity: Not on file  Other Topics Concern  . Not on file  Social History Narrative  . Not on file   Social Determinants of Health   Financial Resource Strain:   . Difficulty of Paying Living Expenses:   Food Insecurity:   . Worried About Charity fundraiser in the Last Year:   . Arboriculturist in the Last Year:   Transportation Needs:   . Film/video editor (Medical):   Marland Kitchen Lack of Transportation (Non-Medical):   Physical Activity:   . Days of Exercise per Week:   . Minutes of Exercise per Session:   Stress:   . Feeling of Stress :   Social Connections:   . Frequency of Communication with Friends and Family:   . Frequency of Social Gatherings with Friends and Family:   . Attends Religious Services:   . Active Member of Clubs or  Organizations:   . Attends Archivist Meetings:   Marland Kitchen Marital Status:   Intimate Partner Violence:   . Fear of Current or Ex-Partner:   . Emotionally Abused:   Marland Kitchen Physically Abused:   . Sexually Abused:     Past Surgical History:  Procedure Laterality Date  . adenomatous polyp  06/08/2018  . ANTERIOR CERVICAL DISCECTOMY  ?  Marland Kitchen BACK SURGERY    . COLONOSCOPY WITH PROPOFOL N/A 07/25/2018   Procedure: COLONOSCOPY WITH PROPOFOL;  Surgeon: Manya Silvas, MD;  Location[facility] ENDOSCOPY;  Service: Endoscopy;  Laterality: N/A;  . HAMMER TOE SURGERY Right 2007   2nd toe  . INSERT / REPLACE / REMOVE PACEMAKER    . LUMBAR LAMINECTOMY/DECOMPRESSION MICRODISCECTOMY Bilateral 10/28/2016   Procedure: LUMBAR LAMINECTOMY/DECOMPRESSION MICRODISCECTOMY 3 LEVELS;  Surgeon: Meade Maw, MD;  Location: [facility] ORS;  Service: Neurosurgery;  Laterality: Bilateral;  L3-4, L4-5,L5-S1 Laminectomies and bilateral foraminotomies  . PACEMAKER INSERTION  2011  . RADIOACTIVE SEED IMPLANT N/A 11/07/2018   Procedure: RADIOACTIVE SEED IMPLANT/BRACHYTHERAPY IMPLANT;  Surgeon: Hollice Espy, MD;  Location: [facility] ORS;  Service: Urology;  Laterality: N/A;    Family History  Problem Relation Age of Onset  . Hypertension Father   . Stroke Father   . Colon cancer Father   . Aortic aneurysm Mother   . Prostate cancer Neg Hx   . Kidney cancer Neg Hx   . Bladder Cancer Neg Hx     No Known Allergies     Assessment & Plan:   1. Claudication [facility]) Recommend:  I do not find evidence of Vascular pathology that would explain the patient's symptoms  The patient has atypical pain symptoms for vascular disease  I do not find evidence of Vascular pathology that would explain the patient's symptoms and I suspect the patient is c/o pseudoclaudication.  Patient should have an evaluation of his LS spine which I defer to the primary service.   The patient should continue walking and begin a more formal exercise  program. The patient should continue his antiplatelet therapy and aggressive treatment of the lipid abnormalities. The patient should begin wearing graduated compression socks 15-20 mmHg strength to control her mild edema.  Patient will follow-up with me on a PRN basis  Further work-up of her lower extremity pain is deferred to the primary service     2.  Hyperlipidemia, unspecified hyperlipidemia type Continue statin as ordered and reviewed, no changes at this time   3. Lumbar stenosis with neurogenic claudication Patient has had previous intervention on his lumbar spine.  Patient should follow-up with PCP for further work-up.  Current Outpatient Medications on File Prior to Visit  Medication Sig Dispense Refill  . acetaminophen (TYLENOL) 500 MG tablet Take 1,000 mg by mouth every 6 (six) hours as needed (for pain.).    Marland Kitchen aspirin EC 81 MG tablet Take 81 mg by mouth daily.    . Calcium 600-200 MG-UNIT tablet Take 1 tablet by mouth daily.    Marland Kitchen docusate sodium (COLACE) 100 MG capsule Take 1 capsule (100 mg total) by mouth 2 (two) times daily. 60 capsule 0  . fexofenadine-pseudoephedrine (ALLEGRA-D 24) 180-240 MG 24 hr tablet Take 1 tablet by mouth daily as needed (allergy).     . fluocinonide cream (LIDEX) 2.91 % Apply 1 application topically 2 (two) times daily as needed (for skin irritation).    . hydrochlorothiazide (HYDRODIURIL) 25 MG tablet Take 25 mg by mouth daily.    . hydrocortisone 2.5 % cream Apply 1 application topically 2 (two) times daily as needed (for skin irritation).    . polyethylene glycol (MIRALAX / GLYCOLAX) packet Take 17 g by mouth daily as needed (constipation.).     Marland Kitchen potassium chloride SA (K-DUR,KLOR-CON) 20 MEQ tablet Take 20 mEq by mouth daily.     . pravastatin (PRAVACHOL) 20 MG tablet Take 20 mg by mouth every evening.     . tadalafil (CIALIS) 20 MG tablet Take 1 tablet (20 mg total) by mouth daily as needed for erectile dysfunction. 30 tablet 3  . tamsulosin  (FLOMAX) 0.4 MG CAPS capsule Take 1 capsule (0.4 mg total) by mouth daily. 90 capsule 3  . vitamin B-12 (CYANOCOBALAMIN) 1000 MCG tablet Take 1,000 mcg by mouth daily.    . fesoterodine (TOVIAZ) 4 MG TB24 tablet Take 1 tablet (4 mg total) by mouth daily. (Patient not taking: Reported on 05/28/2020) 90 tablet 3  . finasteride (PROSCAR) 5 MG tablet Take 1 tablet (5 mg total) by mouth daily. (Patient not taking: Reported on 05/28/2020) 90 tablet 3  . mirabegron ER (MYRBETRIQ) 25 MG TB24 tablet Take 1 tablet (25 mg total) by mouth daily. (Patient not taking: Reported on 05/28/2020) 30 tablet 11  . sildenafil (REVATIO) 20 MG tablet Take 2-5 tablets (40-100 mg total) by mouth daily as needed (erectile dysfunction.). (Patient not taking: Reported on 05/28/2020) 10 tablet 4   No current facility-administered medications on file prior to visit.    There are no Patient Instructions on file for this visit. No follow-ups on file.   Kris Hartmann, NP

## 2020-06-20 ENCOUNTER — Other Ambulatory Visit: Payer: Medicare Other

## 2020-06-25 ENCOUNTER — Other Ambulatory Visit: Payer: Self-pay | Admitting: Family Medicine

## 2020-06-25 ENCOUNTER — Ambulatory Visit: Payer: Medicare Other | Admitting: Urology

## 2020-06-25 ENCOUNTER — Other Ambulatory Visit: Payer: Self-pay

## 2020-06-25 ENCOUNTER — Other Ambulatory Visit: Payer: Medicare PPO

## 2020-06-25 DIAGNOSIS — C61 Malignant neoplasm of prostate: Secondary | ICD-10-CM

## 2020-06-26 LAB — PSA: Prostate Specific Ag, Serum: 0.2 ng/mL (ref 0.0–4.0)

## 2020-07-02 ENCOUNTER — Other Ambulatory Visit: Payer: Self-pay

## 2020-07-02 ENCOUNTER — Encounter: Payer: Self-pay | Admitting: Urology

## 2020-07-02 ENCOUNTER — Ambulatory Visit: Payer: Medicare PPO | Admitting: Urology

## 2020-07-02 VITALS — BP 131/77 | HR 88 | Ht 63.0 in | Wt 215.0 lb

## 2020-07-02 DIAGNOSIS — R9721 Rising PSA following treatment for malignant neoplasm of prostate: Secondary | ICD-10-CM

## 2020-07-02 DIAGNOSIS — N529 Male erectile dysfunction, unspecified: Secondary | ICD-10-CM

## 2020-07-02 DIAGNOSIS — C61 Malignant neoplasm of prostate: Secondary | ICD-10-CM | POA: Diagnosis not present

## 2020-07-02 DIAGNOSIS — R399 Unspecified symptoms and signs involving the genitourinary system: Secondary | ICD-10-CM

## 2020-07-02 LAB — URINALYSIS, COMPLETE
Bilirubin, UA: NEGATIVE
Glucose, UA: NEGATIVE
Leukocytes,UA: NEGATIVE
Nitrite, UA: NEGATIVE
Protein,UA: NEGATIVE
Specific Gravity, UA: 1.02 (ref 1.005–1.030)
Urobilinogen, Ur: 0.2 mg/dL (ref 0.2–1.0)
pH, UA: 5 (ref 5.0–7.5)

## 2020-07-02 LAB — MICROSCOPIC EXAMINATION: Bacteria, UA: NONE SEEN

## 2020-07-02 LAB — BLADDER SCAN AMB NON-IMAGING: Scan Result: 0

## 2020-07-02 MED ORDER — TAMSULOSIN HCL 0.4 MG PO CAPS: 0.4000 mg | ORAL_CAPSULE | Freq: Every day | ORAL | 3 refills | Status: DC

## 2020-07-02 MED ORDER — TADALAFIL 20 MG PO TABS: 20.0000 mg | ORAL_TABLET | Freq: Every day | ORAL | 3 refills | Status: DC | PRN

## 2020-07-02 NOTE — Progress Notes (Signed)
10:13 AM   Richard Alexander [birth year] [id number]  Referring provider: Ezequiel Kayser, MD [facility] [place],  [place] [address]  Chief Complaint  Patient presents with  . Prostate Cancer    HPI: Richard Alexander is a 79 y.o. male who presents today for a follow up on his prostate cancer.  Prostate cancer He underwent previous prostate biopsy on 08/12/2018 for an elevated PSA rising on finasteride, 5.3 at the time of biopsy (10.6 adjusted).  He was also noted to have induration on the right side of his prostate gland on rectal exam.  Transrectal ultrasound volume 26 g.  Prostate biopsy showed 7/12 cores positive of Gleason 3+4 and 3+3 prostate cancer.  His primary tumor was on the right with 3 cores of Gleason 3+4 involving up to 86% of the tissue right apex and mid gland.  The remainder of the prostate showed low volume Gleason 3+3 dispersed throughout the prostate.  Patient had 1-125 prostate seeds implanted on 11/07/2018 with a follow-up on 12/05/18.  Has received 6 months Lupon in 12/2018.  Most recent PSA is 0.2 on 06/25/2020  BPH WITH LUTS  (prostate and/or bladder) IPSS score:8/1  Previous score: 4/1 Previous PVR: 0 mL  Major complaint(s): Feelings of incomplete emptying.   Patient denies any modifying or aggravating factors.  Patient denies any gross hematuria, dysuria or suprapubic/flank pain.  Patient denies any fevers, chills, nausea or vomiting.   Currently taking: tamsulosin 0.4 mg   UA today is unremarkable.   IPSS    Row Name 07/02/20 0800         International Prostate Symptom Score   How often have you had the sensation of not emptying your bladder? Almost always     How often have you had to urinate less than every two hours? Less than 1 in 5 times     How often have you found you stopped and started again several times when you urinated? Not at All     How often have you found it difficult to postpone urination? Less than 1 in 5  times     How often have you had a weak urinary stream? Not at All     How often have you had to strain to start urination? Not at All     How many times did you typically get up at night to urinate? 1 Time     Total IPSS Score 8       Quality of Life due to urinary symptoms   If you were to spend the rest of your life with your urinary condition just the way it is now how would you feel about that? Pleased            Score:  1-7 Mild 8-19 Moderate 20-35 Severe  Erectile dysfunction Previous SHIM score: 6     New SHIM score: 16 Risk factors:  age, pelvic radiation, prostate cancer and sleep apnea, CAD. No  painful erections or curvatures with his erections.    Still having having spontaneous erections Tried:  Tadalafil 20 mg with success     SHIM    Row Name 07/02/20 0841         SHIM: Over the last 6 months:   How do you rate your confidence that you could get and keep an erection? Low     When you had erections with sexual stimulation, how often were your erections hard enough for penetration (  entering your partner)? Sometimes (about half the time)     During sexual intercourse, how often were you able to maintain your erection after you had penetrated (entered) your partner? Sometimes (about half the time)     During sexual intercourse, how difficult was it to maintain your erection to completion of intercourse? Slightly Difficult     When you attempted sexual intercourse, how often was it satisfactory for you? Sometimes (about half the time)       SHIM Total Score   SHIM 15            Score: 1-7 Severe ED 8-11 Moderate ED 12-16 Mild-Moderate ED 17-21 Mild ED 22-25 No ED    PMH: Past Medical History:  Diagnosis Date  . Allergic rhinitis   . Arthritis   . DDD (degenerative disc disease), cervical   . Hypertension   . Presence of permanent cardiac pacemaker   . Sleep apnea    CPAP    Surgical History: Past Surgical History:  Procedure Laterality  Date  . adenomatous polyp  06/08/2018  . ANTERIOR CERVICAL DISCECTOMY  ?  Marland Kitchen BACK SURGERY    . COLONOSCOPY WITH PROPOFOL N/A 07/25/2018   Procedure: COLONOSCOPY WITH PROPOFOL;  Surgeon: Manya Silvas, MD;  Location: [facility] ENDOSCOPY;  Service: Endoscopy;  Laterality: N/A;  . HAMMER TOE SURGERY Right 2007   2nd toe  . INSERT / REPLACE / REMOVE PACEMAKER    . LUMBAR LAMINECTOMY/DECOMPRESSION MICRODISCECTOMY Bilateral 10/28/2016   Procedure: LUMBAR LAMINECTOMY/DECOMPRESSION MICRODISCECTOMY 3 LEVELS;  Surgeon: Meade Maw, MD;  Location: [facility] ORS;  Service: Neurosurgery;  Laterality: Bilateral;  L3-4, L4-5,L5-S1 Laminectomies and bilateral foraminotomies  . PACEMAKER INSERTION  2011  . RADIOACTIVE SEED IMPLANT N/A 11/07/2018   Procedure: RADIOACTIVE SEED IMPLANT/BRACHYTHERAPY IMPLANT;  Surgeon: Hollice Espy, MD;  Location: [facility] ORS;  Service: Urology;  Laterality: N/A;    Home Medications:  Allergies as of 07/02/2020   No Known Allergies     Medication List       Accurate as of July 02, 2020 10:13 AM. If you have any questions, ask your nurse or doctor.        acetaminophen 500 MG tablet Commonly known as: TYLENOL Take 1,000 mg by mouth every 6 (six) hours as needed (for pain.).   aspirin EC 81 MG tablet Take 81 mg by mouth daily.   Calcium 600-200 MG-UNIT tablet Take 1 tablet by mouth daily.   docusate sodium 100 MG capsule Commonly known as: COLACE Take 1 capsule (100 mg total) by mouth 2 (two) times daily.   fesoterodine 4 MG Tb24 tablet Commonly known as: TOVIAZ Take 1 tablet (4 mg total) by mouth daily.   fexofenadine-pseudoephedrine 180-240 MG 24 hr tablet Commonly known as: ALLEGRA-D 24 Take 1 tablet by mouth daily as needed (allergy).   finasteride 5 MG tablet Commonly known as: PROSCAR Take 1 tablet (5 mg total) by mouth daily.   fluocinonide cream 0.05 % Commonly known as: LIDEX Apply 1 application topically 2 (two) times daily as needed (for skin  irritation).   hydrochlorothiazide 25 MG tablet Commonly known as: HYDRODIURIL Take 25 mg by mouth daily.   hydrocortisone 2.5 % cream Apply 1 application topically 2 (two) times daily as needed (for skin irritation).   mirabegron ER 25 MG Tb24 tablet Commonly known as: MYRBETRIQ Take 1 tablet (25 mg total) by mouth daily.   polyethylene glycol 17 g packet Commonly known as: MIRALAX / GLYCOLAX Take 17 g by mouth daily  as needed (constipation.).   potassium chloride SA 20 MEQ tablet Commonly known as: KLOR-CON Take 20 mEq by mouth daily.   pravastatin 20 MG tablet Commonly known as: PRAVACHOL Take 20 mg by mouth every evening.   sildenafil 20 MG tablet Commonly known as: REVATIO Take 2-5 tablets (40-100 mg total) by mouth daily as needed (erectile dysfunction.).   tadalafil 20 MG tablet Commonly known as: CIALIS Take 1 tablet (20 mg total) by mouth daily as needed for erectile dysfunction.   tamsulosin 0.4 MG Caps capsule Commonly known as: FLOMAX Take 1 capsule (0.4 mg total) by mouth daily.   vitamin B-12 1000 MCG tablet Commonly known as: CYANOCOBALAMIN Take 1,000 mcg by mouth daily.       Allergies: No Known Allergies  Family History: Family History  Problem Relation Age of Onset  . Hypertension Father   . Stroke Father   . Colon cancer Father   . Aortic aneurysm Mother   . Prostate cancer Neg Hx   . Kidney cancer Neg Hx   . Bladder Cancer Neg Hx     Social History:  reports that he quit smoking about 44 years ago. His smoking use included cigarettes. He has never used smokeless tobacco. He reports that he does not drink alcohol and does not use drugs.  ROS: For pertinent review of systems please refer to history of present illness  Physical Exam: BP 131/77   Pulse 88   Ht 5\' 3"  (1.6 m)   Wt 215 lb (97.5 kg)   BMI 38.09 kg/m   Constitutional:  Well nourished. Alert and oriented, No acute distress. HEENT: [place] AT, mask in place.  Trachea  midline Cardiovascular: No clubbing, cyanosis, or edema. Respiratory: Normal respiratory effort, no increased work of breathing. Neurologic: Grossly intact, no focal deficits, moving all 4 extremities. Psychiatric: Normal mood and affect.  Laboratory Data: Lab Results  Component Value Date   WBC 8.2 10/31/2018   HGB 13.3 10/31/2018   HCT 41.2 10/31/2018   MCV 94.3 10/31/2018   PLT 347 10/31/2018    Lab Results  Component Value Date   CREATININE 0.89 10/19/2016    Lab Results  Component Value Date   AST 24 10/19/2016   Lab Results  Component Value Date   ALT 22 10/19/2016    Urinalysis Component     Latest Ref Rng & Units 07/02/2020  Specific Gravity, UA     1.005 - 1.030 1.020  pH, UA     5.0 - 7.5 5.0  Color, UA     Yellow Yellow  Appearance Ur     Clear Clear  Leukocytes,UA     Negative Negative  Protein,UA     Negative/Trace Negative  Glucose, UA     Negative Negative  Ketones, UA     Negative Trace (A)  RBC, UA     Negative Trace (A)  Bilirubin, UA     Negative Negative  Urobilinogen, Ur     0.2 - 1.0 mg/dL 0.2  Nitrite, UA     Negative Negative  Microscopic Examination      See below:   Component     Latest Ref Rng & Units 07/02/2020  WBC, UA     0 - 5 /hpf 0-5  RBC     0 - 2 /hpf 0-2  Epithelial Cells (non renal)     0 - 10 /hpf 0-10  Bacteria, UA     None seen/Few None seen    I  have reviewed the labs.  Pertinent Imaging:  Ref. Range 07/02/2020 8:31  Scan Result Unknown 0    Assessment & Plan:    1. Prostate Cancer [facility]) Gleason 3+4 intermediate risk disease, cT2 - seed implant 10/2018 with 6 months ADT PSA is 0.2 We discussed that the slight increase in his PSA may be transient, due to benign prostate tissue growth or a biochemical recurrence - we will continue to trend the PSA at this time  RTC in 3 months for PSA only  2. BPH with obstruction/lower urinary tract symptoms IPSS score is 8/1, it is worse Continue  conservative management, avoiding bladder irritants and timed voiding's Continue tamsulosin 0.4 mg daily - refills given RTC in 6 months for IPSS, PSA, PVR and exam   3. Erectile dysfunction of organic origin SHIM score is 16 so it has improved Tadalafil 20 mg sent to [place] [facility] in 6 months for repeat SHIM score and exam   Return in about 3 months (around 10/02/2020) for PSA only .  These notes generated with voice recognition software. I apologize for typographical errors.   I, Joneen Boers Peace, am acting as a Education administrator for [employer]. [address] [place], [place] [address] [phone]

## 2020-07-08 ENCOUNTER — Other Ambulatory Visit: Payer: Self-pay | Admitting: Physical Medicine & Rehabilitation

## 2020-07-08 DIAGNOSIS — G8929 Other chronic pain: Secondary | ICD-10-CM

## 2020-07-18 ENCOUNTER — Other Ambulatory Visit: Payer: Self-pay | Admitting: Physical Medicine & Rehabilitation

## 2020-07-18 DIAGNOSIS — M5442 Lumbago with sciatica, left side: Secondary | ICD-10-CM

## 2020-07-25 ENCOUNTER — Ambulatory Visit
Admission: RE | Admit: 2020-07-25 | Discharge: 2020-07-25 | Disposition: A | Discharge status: Home / Self Care | Payer: Medicare PPO | Source: Ambulatory Visit | Attending: Physical Medicine & Rehabilitation | Admitting: Physical Medicine & Rehabilitation

## 2020-07-25 ENCOUNTER — Other Ambulatory Visit: Payer: Self-pay

## 2020-07-25 DIAGNOSIS — M5442 Lumbago with sciatica, left side: Secondary | ICD-10-CM

## 2020-07-25 DIAGNOSIS — M5441 Lumbago with sciatica, right side: Secondary | ICD-10-CM | POA: Insufficient documentation

## 2020-07-25 DIAGNOSIS — G8929 Other chronic pain: Secondary | ICD-10-CM | POA: Insufficient documentation

## 2020-07-25 LAB — CBC
HCT: 37.8 % — ABNORMAL LOW (ref 39.0–52.0)
Hemoglobin: 12.8 g/dL — ABNORMAL LOW (ref 13.0–17.0)
MCH: 30.3 pg (ref 26.0–34.0)
MCHC: 33.9 g/dL (ref 30.0–36.0)
MCV: 89.6 fL (ref 80.0–100.0)
Platelets: 277 10*3/uL (ref 150–400)
RBC: 4.22 MIL/uL (ref 4.22–5.81)
RDW: 14.6 % (ref 11.5–15.5)
WBC: 8.3 10*3/uL (ref 4.0–10.5)
nRBC: 0 % (ref 0.0–0.2)

## 2020-07-25 LAB — APTT: aPTT: 39 seconds — ABNORMAL HIGH (ref 24–36)

## 2020-07-25 LAB — PROTIME-INR
INR: 1 (ref 0.8–1.2)
Prothrombin Time: 12.7 seconds (ref 11.4–15.2)

## 2020-07-25 MED ORDER — LIDOCAINE HCL (PF) 1 % IJ SOLN
5.0000 mL | Freq: Once | INTRAMUSCULAR | Status: AC
  Administered 2020-07-25: 5 mL
  Filled 2020-07-25: qty 5

## 2020-07-25 MED ORDER — IOHEXOL 180 MG/ML  SOLN
13.0000 mL | Freq: Once | INTRAMUSCULAR | Status: AC
  Administered 2020-07-25: 13 mL

## 2020-07-25 NOTE — Progress Notes (Signed)
Pt stable after myelogram.Vss.Back stable.D/c instructions given.F/u with his MD.Thankyou.

## 2020-10-02 ENCOUNTER — Other Ambulatory Visit: Payer: Self-pay | Admitting: Family Medicine

## 2020-10-02 DIAGNOSIS — C61 Malignant neoplasm of prostate: Secondary | ICD-10-CM

## 2020-10-03 ENCOUNTER — Other Ambulatory Visit: Payer: Self-pay

## 2020-10-03 ENCOUNTER — Other Ambulatory Visit: Payer: Medicare PPO

## 2020-10-03 DIAGNOSIS — C61 Malignant neoplasm of prostate: Secondary | ICD-10-CM

## 2020-10-04 ENCOUNTER — Telehealth: Payer: Self-pay | Admitting: Family Medicine

## 2020-10-04 LAB — PSA: Prostate Specific Ag, Serum: 0.3 ng/mL (ref 0.0–4.0)

## 2020-10-04 NOTE — Telephone Encounter (Signed)
-----   Message from Nori Riis, PA-C sent at 10/04/2020  8:22 AM EDT ----- Please let Mr. Hawe know that his PSA has increased from 0.2 to 0.3.  We will repeat it again in January at his appointment.

## 2020-10-04 NOTE — Telephone Encounter (Signed)
Patient notified and voiced understanding.

## 2020-10-09 ENCOUNTER — Ambulatory Visit: Payer: Medicare PPO | Admitting: Urology

## 2020-12-13 ENCOUNTER — Other Ambulatory Visit
Admission: RE | Admit: 2020-12-13 | Discharge: 2020-12-13 | Disposition: A | Discharge status: Home / Self Care | Payer: Medicare PPO | Source: Ambulatory Visit | Attending: Cardiology | Admitting: Cardiology

## 2020-12-13 ENCOUNTER — Other Ambulatory Visit: Payer: Self-pay

## 2020-12-13 DIAGNOSIS — Z01812 Encounter for preprocedural laboratory examination: Secondary | ICD-10-CM | POA: Diagnosis present

## 2020-12-13 DIAGNOSIS — Z20822 Contact with and (suspected) exposure to covid-19: Secondary | ICD-10-CM | POA: Diagnosis not present

## 2020-12-13 LAB — SARS CORONAVIRUS 2 (TAT 6-24 HRS): SARS Coronavirus 2: NEGATIVE

## 2020-12-17 ENCOUNTER — Encounter: Payer: Self-pay | Admitting: Cardiology

## 2020-12-17 ENCOUNTER — Ambulatory Visit
Admission: RE | Admit: 2020-12-17 | Discharge: 2020-12-17 | Disposition: A | Discharge status: Home / Self Care | Payer: Medicare PPO | Attending: Cardiology | Admitting: Cardiology

## 2020-12-17 ENCOUNTER — Encounter
Admission: RE | Disposition: A | Discharge status: Home / Self Care | Payer: Self-pay | Source: Home / Self Care | Attending: Cardiology

## 2020-12-17 ENCOUNTER — Other Ambulatory Visit: Payer: Self-pay

## 2020-12-17 DIAGNOSIS — Z7982 Long term (current) use of aspirin: Secondary | ICD-10-CM | POA: Diagnosis not present

## 2020-12-17 DIAGNOSIS — Z79899 Other long term (current) drug therapy: Secondary | ICD-10-CM | POA: Insufficient documentation

## 2020-12-17 DIAGNOSIS — Z45010 Encounter for checking and testing of cardiac pacemaker pulse generator [battery]: Secondary | ICD-10-CM | POA: Diagnosis not present

## 2020-12-17 DIAGNOSIS — I10 Essential (primary) hypertension: Secondary | ICD-10-CM | POA: Diagnosis not present

## 2020-12-17 DIAGNOSIS — I495 Sick sinus syndrome: Secondary | ICD-10-CM | POA: Diagnosis not present

## 2020-12-17 DIAGNOSIS — Z45018 Encounter for adjustment and management of other part of cardiac pacemaker: Secondary | ICD-10-CM

## 2020-12-17 DIAGNOSIS — E7800 Pure hypercholesterolemia, unspecified: Secondary | ICD-10-CM | POA: Diagnosis not present

## 2020-12-17 DIAGNOSIS — G4733 Obstructive sleep apnea (adult) (pediatric): Secondary | ICD-10-CM | POA: Diagnosis not present

## 2020-12-17 DIAGNOSIS — Z87891 Personal history of nicotine dependence: Secondary | ICD-10-CM | POA: Diagnosis not present

## 2020-12-17 HISTORY — PX: PPM GENERATOR CHANGEOUT: EP1233

## 2020-12-17 SURGERY — PPM GENERATOR CHANGEOUT
Anesthesia: Moderate Sedation

## 2020-12-17 MED ORDER — SODIUM CHLORIDE 0.9 % IV SOLN
80.0000 mg | INTRAVENOUS | Status: AC
  Administered 2020-12-17: 09:00:00 80 mg
  Filled 2020-12-17: qty 80

## 2020-12-17 MED ORDER — LIDOCAINE HCL (PF) 1 % IJ SOLN
INTRAMUSCULAR | Status: DC | PRN
  Administered 2020-12-17: 30 mL

## 2020-12-17 MED ORDER — CEFAZOLIN SODIUM-DEXTROSE 2-4 GM/100ML-% IV SOLN
2.0000 g | INTRAVENOUS | Status: AC
  Administered 2020-12-17: 08:00:00 2 g via INTRAVENOUS
  Filled 2020-12-17: qty 100

## 2020-12-17 MED ORDER — ACETAMINOPHEN 325 MG PO TABS: 325.0000 mg | ORAL_TABLET | ORAL | Status: DC | PRN

## 2020-12-17 MED ORDER — CEPHALEXIN 250 MG PO CAPS: 500.0000 mg | ORAL_CAPSULE | Freq: Two times a day (BID) | ORAL | 0 refills | Status: DC

## 2020-12-17 MED ORDER — MIDAZOLAM HCL 2 MG/2ML IJ SOLN
INTRAMUSCULAR | Status: DC | PRN
  Administered 2020-12-17: 1 mg via INTRAVENOUS

## 2020-12-17 MED ORDER — ONDANSETRON HCL 4 MG/2ML IJ SOLN: 4.0000 mg | Freq: Four times a day (QID) | INTRAMUSCULAR | Status: DC | PRN

## 2020-12-17 MED ORDER — LIDOCAINE HCL (PF) 1 % IJ SOLN
INTRAMUSCULAR | Status: AC
  Filled 2020-12-17: qty 30

## 2020-12-17 MED ORDER — FENTANYL CITRATE (PF) 100 MCG/2ML IJ SOLN
INTRAMUSCULAR | Status: AC
  Filled 2020-12-17: qty 2

## 2020-12-17 MED ORDER — FENTANYL CITRATE (PF) 100 MCG/2ML IJ SOLN
INTRAMUSCULAR | Status: DC | PRN
  Administered 2020-12-17: 50 ug via INTRAVENOUS

## 2020-12-17 MED ORDER — CHLORHEXIDINE GLUCONATE CLOTH 2 % EX PADS: 6.0000 | MEDICATED_PAD | Freq: Every day | CUTANEOUS | Status: DC

## 2020-12-17 MED ORDER — MIDAZOLAM HCL 2 MG/2ML IJ SOLN
INTRAMUSCULAR | Status: AC
  Filled 2020-12-17: qty 2

## 2020-12-17 MED ORDER — SODIUM CHLORIDE 0.9 % IV SOLN: INTRAVENOUS | Status: DC

## 2020-12-17 SURGICAL SUPPLY — 9 items
CABLE SURG 12 DISP A/V CHANNEL (MISCELLANEOUS) ×6 IMPLANT
DEVICE DSSCT PLSMBLD 3.0S LGHT (MISCELLANEOUS) ×1 IMPLANT
IPG PACE AZUR XT DR MRI W1DR01 (Pacemaker) ×1 IMPLANT
KIT WRENCH (KITS) ×3 IMPLANT
PACE AZURE XT DR MRI W1DR01 (Pacemaker) ×3 IMPLANT
PACK PACE INSERTION (MISCELLANEOUS) ×3 IMPLANT
PAD ELECT DEFIB RADIOL ZOLL (MISCELLANEOUS) ×6 IMPLANT
PLASMABLADE 3.0S W/LIGHT (MISCELLANEOUS) ×3
SLEEVE ISOL F/PACE RF HD COVER (MISCELLANEOUS) ×3 IMPLANT

## 2020-12-17 NOTE — Discharge Instructions (Signed)
Moderate Conscious Sedation, Adult, Care After These instructions provide you with information about caring for yourself after your procedure. Your health care provider may also give you more specific instructions. Your treatment has been planned according to current medical practices, but problems sometimes occur. Call your health care provider if you have any problems or questions after your procedure. What can I expect after the procedure? After your procedure, it is common:  To feel sleepy for several hours.  To feel clumsy and have poor balance for several hours.  To have poor judgment for several hours.  To vomit if you eat too soon. Follow these instructions at home: For at least 24 hours after the procedure:   Do not: ? Participate in activities where you could fall or become injured. ? Drive. ? Use heavy machinery. ? Drink alcohol. ? Take sleeping pills or medicines that cause drowsiness. ? Make important decisions or sign legal documents. ? Take care of children on your own.  Rest. Eating and drinking  Follow the diet recommended by your health care provider.  If you vomit: ? Drink water, juice, or soup when you can drink without vomiting. ? Make sure you have little or no nausea before eating solid foods. General instructions  Have a responsible adult stay with you until you are awake and alert.  Take over-the-counter and prescription medicines only as told by your health care provider.  If you smoke, do not smoke without supervision.  Keep all follow-up visits as told by your health care provider. This is important. Contact a health care provider if:  You keep feeling nauseous or you keep vomiting.  You feel light-headed.  You develop a rash.  You have a fever. Get help right away if:  You have trouble breathing. This information is not intended to replace advice given to you by your health care provider. Make sure you discuss any questions you have  with your health care provider. Document Revised: 11/19/2017 Document Reviewed: 03/28/2016 Elsevier Patient Education  [place]. Pacemaker Battery Change  A pacemaker battery usually lasts 5-15 years (6-7 years on average). A few times a year, you will be asked to visit your health care provider to have a full evaluation of your pacemaker. When the battery is low, your pacemaker battery and generator will be completely replaced. Most often, this procedure is simpler than the first surgery because the wires (leads) that connect the generator to the heart are already in place. There are many things that affect how long a pacemaker battery will last, including:  The age of the pacemaker.  The number of leads you have(1, 2, or 3).  The pacemaker workload. If the pacemaker is helping the heart more often, the battery will not last as long.  Power (voltage) settings. Tell a health care provider about:  Any allergies you have.  All medicines you are taking, including vitamins, herbs, eye drops, creams, and over-the-counter medicines.  Any problems you or family members have had with anesthetic medicines.  Any blood disorders you have.  Any surgeries you have had, especially the surgeries you have had since your last pacemaker was placed.  Any medical conditions you have.  Whether you are pregnant or may be pregnant.  Any symptoms of heart problems, such as chest pain, trouble breathing, palpitations, light-headedness, or feelings of an abnormal or irregular heartbeat.  Smoking habits. This can affect your reaction to anesthesia. What are the risks? Generally, this is a safe procedure. However, problems  may occur, including:  Bleeding.  Bruising of the skin around where the surgical cut (incision) was made.  Pulling apart of the skin at the incision site.  Infection.  Nerve damage.  Injury to other organs, such as the lungs.  Allergic reaction to anesthetics or  other medicines used during the procedure.  People with diabetes may have a temporary increase in blood sugar (glucose) after any surgical procedure. What happens before the procedure? Staying hydrated Follow instructions from your health care provider about hydration, which may include:  Up to 2 hours before the procedure - you may continue to drink clear liquids, such as water, clear fruit juice, black coffee, and plain tea. Eating and drinking restrictions Follow instructions from your health care provider about eating and drinking restrictions, which may include:  8 hours before the procedure - stop eating heavy meals or foods such as meat, fried foods, or fatty foods.  6 hours before the procedure - stop eating light meals or foods, such as toast or cereal.  6 hours before the procedure - stop drinking milk or drinks that contain milk.  2 hours before the procedure - stop drinking clear liquids. General instructions  Ask your health care provider about: ? Changing or stopping your regular medicines. This is especially important if you are taking diabetes medicines or blood thinners. ? Taking medicines such as aspirin and ibuprofen. These medicines can thin your blood. Do not take these medicines before your procedure if your health care provider instructs you not to. ? Taking a sip of water with any approved medicines on the morning of the procedure.  Plan to have someone take you home after the procedure. What happens during the procedure?  To reduce your risk of infection: ? Your health care team will wash or sanitize their hands. ? The skin around the area of the chest will be washed with soap. ? Hair may be removed from the surgical area.  An IV tube will be inserted into one your veins to give you medicine and fluids.  You will be given one or more of the following: ? A medicine to help you relax (sedative). ? A medicine to numb the area where the pacemaker is located  (local anesthetic).  You may be given antibiotic medicine to prevent infection.  Your health care provider will make an incision to reopen the pocket holding the pacemaker.  The old pacemaker will be disconnected from the leads.  The leads will be tested.  If needed, the leads will be replaced. If the leads are functioning properly, the new pacemaker will be connected to the existing leads.  A heart monitor and the pacemaker programmer will be used to make sure that the newly implanted pacemaker is working properly.  The incision site will be closed. A bandage (dressing) will be placed over the pacemaker site. The procedure may vary among health care providers and hospitals. What happens after the procedure?  Your blood pressure, heart rate, breathing rate, and blood oxygen level will be monitored until your health care team is satisfied that your pacemaker is working properly.  Your health care provider will tell you when your pacemaker will need to be tested again, or when to return to the office for removal of dressing and stitches.  Do not drive for 24 hours if you were given a sedative.  The dressing will be removed 24-48 hours after the procedure, or as told by your health care provider. Summary  A pacemaker  battery usually lasts 5-15 years (6-7 years on average).  When the battery is low, your pacemaker battery and generator will be completely replaced.  Risks of this procedure include bleeding, bruising, infection, damage to other structures, pulling apart of the skin at the incision site, and allergic reactions to medicines or anesthetics.  Most often, this procedure is simpler than the first surgery because the wires (leads) that connect the generator to the heart are already in place. This information is not intended to replace advice given to you by your health care provider. Make sure you discuss any questions you have with your health care provider. Document Revised:  11/19/2017 Document Reviewed: 11/10/2016 Elsevier Patient Education  2020 [employer]. Patient may shower and remove outer bandage on 12/18/2020.  Leave Steri-Strips on.

## 2020-12-19 IMAGING — CT CT L SPINE W/ CM
3 series · 12 of 35 positions shown, 14 images · IV contrast (isovue)
Comparison: Standard CT 07/02/2017. Lumbar myelogram images same
day

CLINICAL DATA: Chronic low back pain radiating to left leg.

EXAM:
CT MYELOGRAPHY LUMBAR SPINE
TECHNIQUE: CT imaging of the lumbar spine was performed after Isovue 200M
contrast administration. Multiplanar CT image reconstructions were
also generated.

[Series 4: l spine soft · axial · 0.32mm/px · z∈[+301,+453]mm · 4 of 110 slices shown, 5 images]
[im 17/110  soft-tissue]
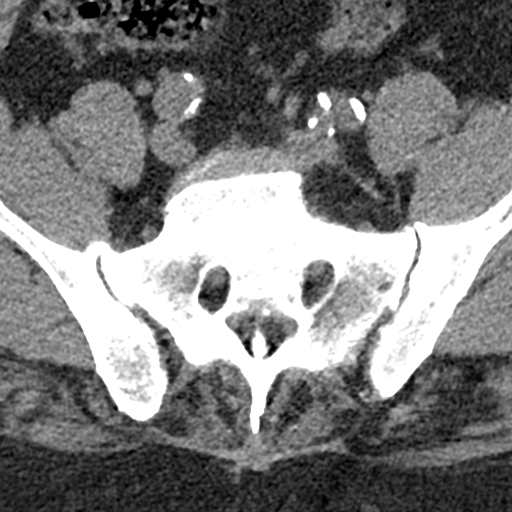
[im 17/110  bone]
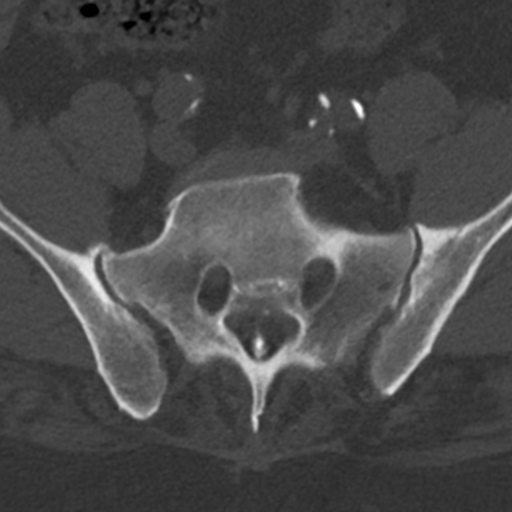
[im 42/110  bone]
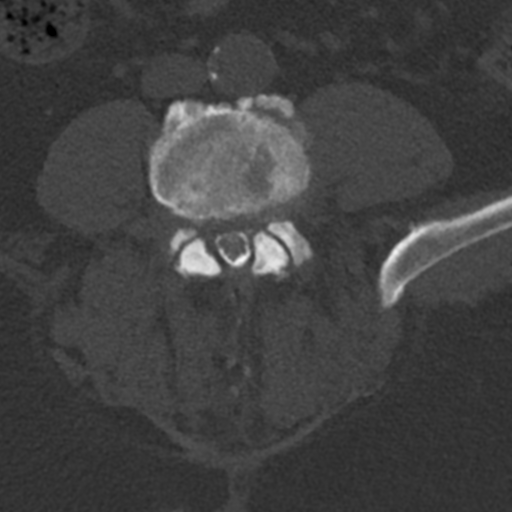
[im 68/110  bone]
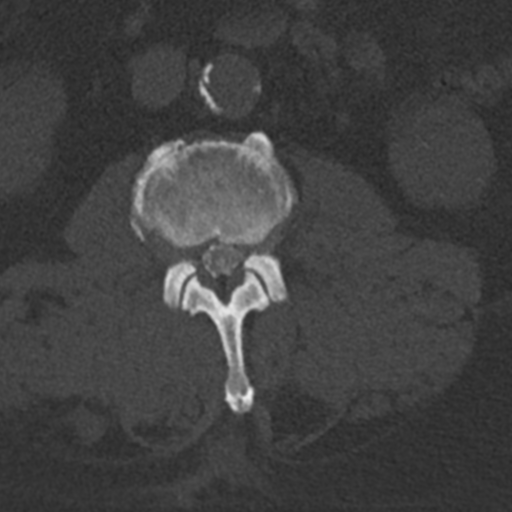
[im 93/110  bone]
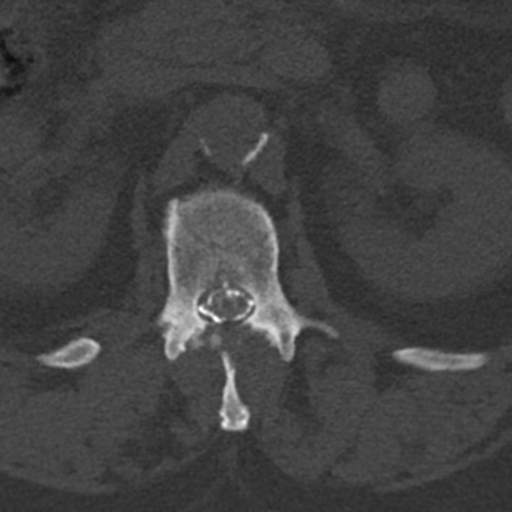

[Series 5: sagittal bone · sagittal · 0.37mm/px · 5 of 101 slices shown, 6 images]
[im 34/101  bone]
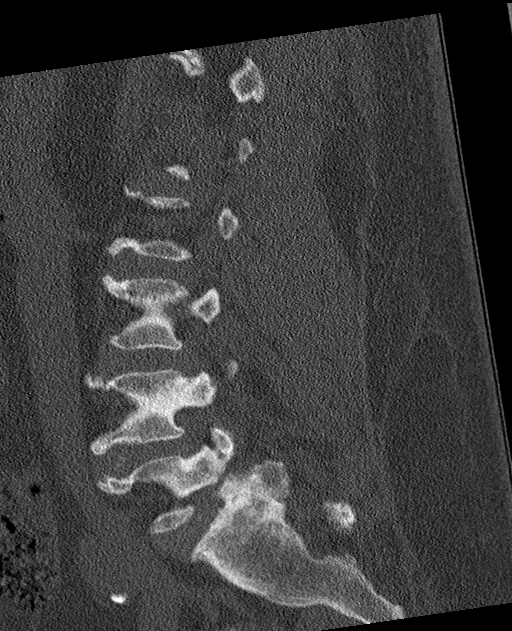
[im 42/101  bone]
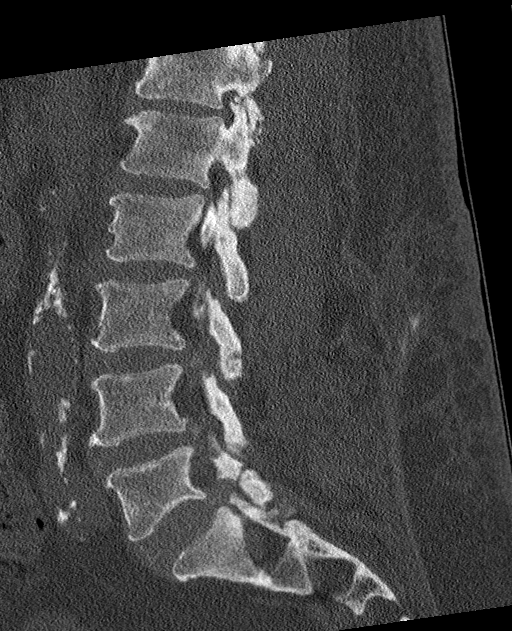
[im 51/101  soft-tissue]
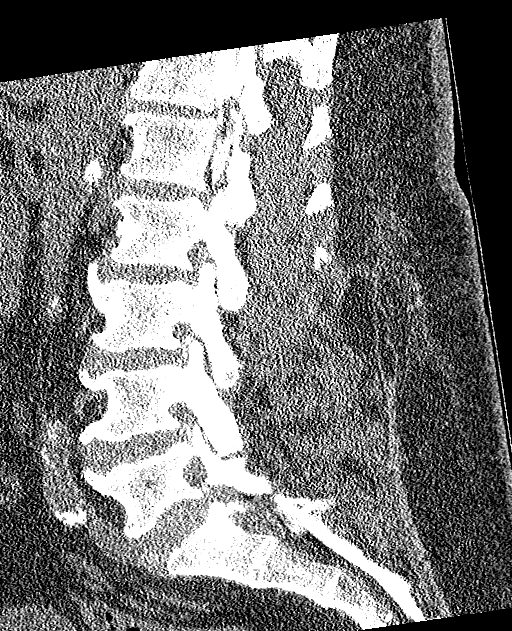
[im 51/101  bone]
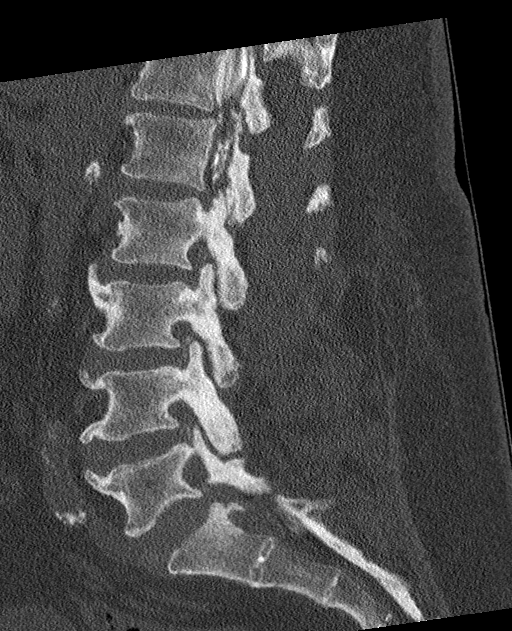
[im 59/101  bone]
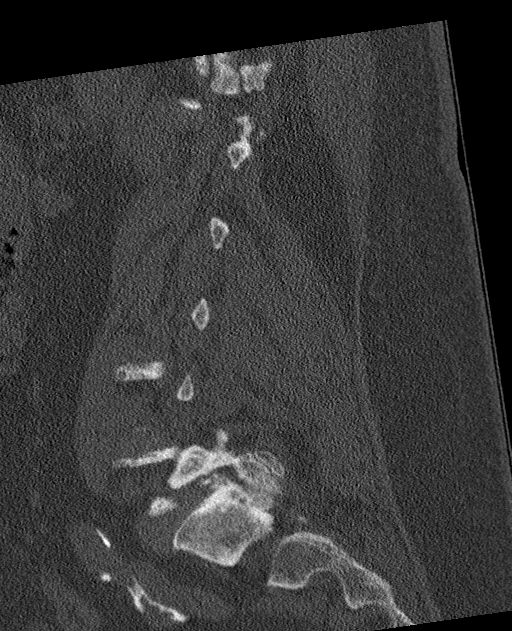
[im 67/101  bone]
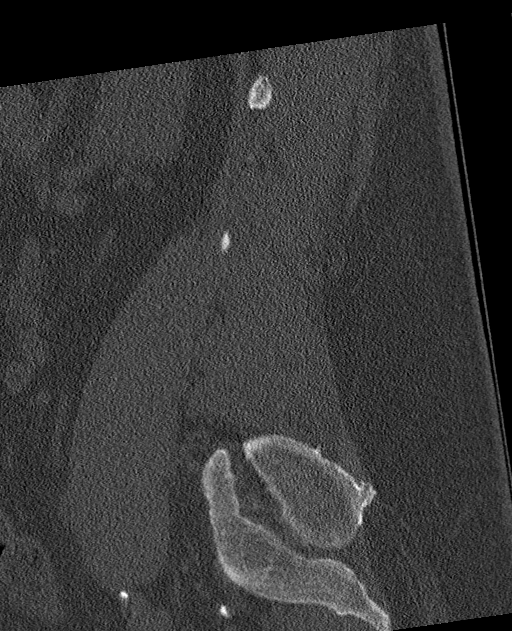

[Series 6: coronal bone · coronal · 0.34mm/px · 3 of 82 slices shown]
[im 17/82  bone]
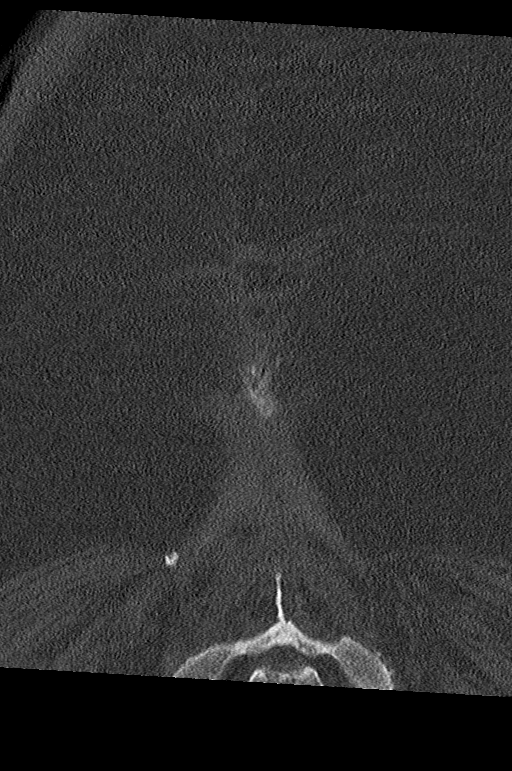
[im 33/82  bone]
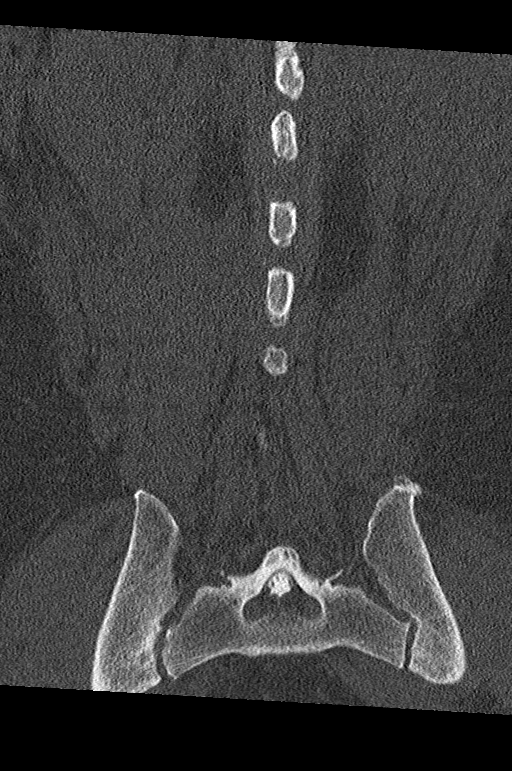
[im 49/82  bone]
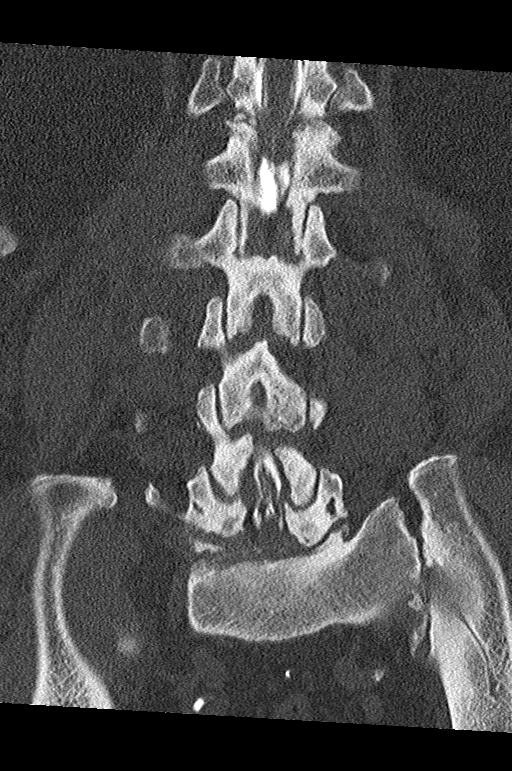

[12 of 35 positions shown; findings below may reference images not displayed]

FINDINGS: Scanning is performed after intrathecal contrast administration. I
think this is a slightly mixed injection with some subdural
component. This does not hinder the interpretation.

Segmentation:  5 lumbar type vertebral bodies.

Alignment: Mild curvature convex to the right. 2 mm anterolisthesis
L5-S1. 2 mm anterolisthesis T12-L1.

Vertebrae: No fracture or primary bone lesion. Pars fracture on the
right at L5, new since 1378 but not apparently acute.

Conus medullaris: Extends to the L1 level and appears normal.

Paraspinal and other soft tissues: Aortic atherosclerosis.

Disc levels:

T12-L1: Disc bulge. Facet degeneration and ligamentous hypertrophy
and calcification. 2 mm of anterolisthesis. Canal narrowing with
effacement of the subarachnoid space surrounding the distal cord. No
definite neural compression however.

L1-2: Bulging of the disc. Mild facet and ligamentous prominence.
Mild canal stenosis but without likely neural compression.

L2-3: Mild bulging of the disc. Mild ligamentous prominence. Mild
canal narrowing but no compressive stenosis.

L3-4: Moderate bulging of the disc. Facet and ligamentous
hypertrophy. Narrowing of both lateral recesses and neural foramina.
This could be symptomatic stenosis and neural compression could
occur on either or both sides.

L4-5: Previous L4 laminectomy. Endplate osteophytes and bulging of
the disc. Bilateral facet hypertrophy. Stenosis of both lateral
recesses and neural foramina that could cause neural compression on
either or both sides.

L5-S1: Previous L5 laminectomy. Bilateral facet arthropathy with 2
mm of anterolisthesis. Pars fracture has developed on the right,
though it does not appear acute. There appears to be broad-based
herniation of disc material. There is bilateral foraminal
encroachment left worse than right. The exiting L5 nerves could be
compressed. There is subarticular lateral recess stenosis
bilaterally that could compress either or both S1 nerves. I think
there is probably some caudally migrated disc material behind the
superior endplate of S1.

Osteoarthritis affects both sacroiliac joints.
IMPRESSION: 1. Scanning is performed after intrathecal contrast administration.
I think this is a slightly mixed injection with some subdural
component. This does not hinder the interpretation.
2. T12-L1: Disc bulge. Facet degeneration and ligamentous
hypertrophy and calcification. 2 mm of anterolisthesis. Canal
narrowing with effacement of the subarachnoid space surrounding the
distal cord. No definite neural compression however.
3. L1-2: Mild canal stenosis but without likely neural compression.
4. L2-3: Mild canal narrowing but without likely neural compression.
5. L3-4: Disc bulge and facet hypertrophy. Narrowing of both lateral
recesses and neural foramina that could cause neural compression on
either or both sides.
6. L4-5: Previous L4 laminectomy. Endplate osteophytes and bulging
of the disc. Bilateral facet hypertrophy. Stenosis of both lateral
recesses and neural foramina that could cause neural compression on
either or both sides.
7. L5-S1: Previous L5 laminectomy. Bilateral facet arthropathy with
2 mm of anterolisthesis. Newly seen pars fracture on the right. This
does not appear acute however. Broad-based herniation of disc
material. Bilateral foraminal encroachment left worse than right.
The exiting L5 nerves could be compressed. There is subarticular
lateral recess stenosis bilaterally that could compress either or
both S1 nerves. I think there is probably some caudally migrated
disc material behind the superior endplate of S1.

Aortic Atherosclerosis ([id number]).

## 2021-01-02 ENCOUNTER — Other Ambulatory Visit: Payer: Self-pay

## 2021-01-02 ENCOUNTER — Other Ambulatory Visit: Payer: Medicare PPO

## 2021-01-02 DIAGNOSIS — C61 Malignant neoplasm of prostate: Secondary | ICD-10-CM

## 2021-01-02 NOTE — Progress Notes (Signed)
8:42 AM   Richard Alexander 1941/06/10 [id number]  Referring provider: Ezequiel Kayser, MD [facility] [place],  [place] [address]  Chief Complaint  Patient presents with  . Prostate Cancer   Urological history 1. Prostate cancer - prostate biopsy on 08/12/2018 for an elevated PSA rising on finasteride, 5.3 at the time of biopsy (10.6 adjusted).  He was also noted to have induration on the right side of his prostate gland on rectal exam.  Transrectal ultrasound volume 26 g.  Prostate biopsy showed 7/12 cores positive of Gleason 3+4 and 3+3 prostate cancer.  His primary tumor was on the right with 3 cores of Gleason 3+4 involving up to 86% of the tissue right apex and mid gland.  The remainder of the prostate showed low volume Gleason 3+3 dispersed throughout the prostate.  Patient had 1-125 prostate seeds implanted on 11/07/2018 with a follow-up on 12/05/18.  Has received 6 months Lupron in 12/2018 - PSA 0.2  2. BPH with LU TS - I PSS: 7/1 - taking tamsulosin 0.4 mg daily  3. ED - SHIM: 18 - taking tadalafil 20 mg on demand dosing    HPI: Richard Alexander. Richard Alexander is a 80 y.o. male who presents today for a follow up on his prostate cancer.  Overall he states he is doing well for the exception of urgency.  He states that once he pulls in the driveway or is on the way to void he has a strong sense of urgency.  As long as his mind is on other things, the urgency is not an issue.  Patient denies any modifying or aggravating factors.  Patient denies any gross hematuria, dysuria or suprapubic/flank pain.  Patient denies any fevers, chills, nausea or vomiting.    IPSS    Row Name 01/03/21 0800         International Prostate Symptom Score   How often have you had the sensation of not emptying your bladder? Less than 1 in 5     How often have you had to urinate less than every two hours? Less than half the time     How often have you found you stopped and started again  several times when you urinated? Not at All     How often have you found it difficult to postpone urination? About half the time     How often have you had a weak urinary stream? Not at All     How often have you had to strain to start urination? Not at All     How many times did you typically get up at night to urinate? 1 Time     Total IPSS Score 7            Score:  1-7 Mild 8-19 Moderate 20-35 Severe    He states he is not very sexually active, but the tadalafil 20 mg is effective.  Patient still having spontaneous erections.  He denies any pain or curvature with erections.    SHIM    Row Name 01/03/21 0836         SHIM: Over the last 6 months:   How do you rate your confidence that you could get and keep an erection? Moderate     When you had erections with sexual stimulation, how often were your erections hard enough for penetration (entering your partner)? Sometimes (about half the time)     During sexual intercourse, how often were you  able to maintain your erection after you had penetrated (entered) your partner? Most Times (much more than half the time)     During sexual intercourse, how difficult was it to maintain your erection to completion of intercourse? Not Difficult     When you attempted sexual intercourse, how often was it satisfactory for you? Sometimes (about half the time)           SHIM Total Score   SHIM 18            PMH: Past Medical History:  Diagnosis Date  . Allergic rhinitis   . Arthritis   . DDD (degenerative disc disease), cervical   . Hypertension   . Presence of permanent cardiac pacemaker   . Sleep apnea    CPAP    Surgical History: Past Surgical History:  Procedure Laterality Date  . adenomatous polyp  06/08/2018  . ANTERIOR CERVICAL DISCECTOMY  ?  Marland Kitchen BACK SURGERY    . COLONOSCOPY WITH PROPOFOL N/A 07/25/2018   Procedure: COLONOSCOPY WITH PROPOFOL;  Surgeon: Manya Silvas, MD;  Location: [facility] ENDOSCOPY;  Service:  Endoscopy;  Laterality: N/A;  . HAMMER TOE SURGERY Right 2007   2nd toe  . INSERT / REPLACE / REMOVE PACEMAKER    . LUMBAR LAMINECTOMY/DECOMPRESSION MICRODISCECTOMY Bilateral 10/28/2016   Procedure: LUMBAR LAMINECTOMY/DECOMPRESSION MICRODISCECTOMY 3 LEVELS;  Surgeon: Meade Maw, MD;  Location: [facility] ORS;  Service: Neurosurgery;  Laterality: Bilateral;  L3-4, L4-5,L5-S1 Laminectomies and bilateral foraminotomies  . PACEMAKER INSERTION  2011  . PPM GENERATOR CHANGEOUT N/A 12/17/2020   Procedure: PPM GENERATOR CHANGEOUT;  Surgeon: Isaias Cowman, MD;  Location: [facility];  Service: Cardiovascular;  Laterality: N/A;  . RADIOACTIVE SEED IMPLANT N/A 11/07/2018   Procedure: RADIOACTIVE SEED IMPLANT/BRACHYTHERAPY IMPLANT;  Surgeon: Hollice Espy, MD;  Location: [facility] ORS;  Service: Urology;  Laterality: N/A;    Home Medications:  Allergies as of 01/03/2021   No Known Allergies     Medication List       Accurate as of January 03, 2021  8:42 AM. If you have any questions, ask your nurse or doctor.        STOP taking these medications   cephALEXin 250 MG capsule Commonly known as: Keflex Stopped by: Kathy Wares, PA-C     TAKE these medications   acetaminophen 500 MG tablet Commonly known as: TYLENOL Take 1,000 mg by mouth every 6 (six) hours as needed (for pain.).   aspirin EC 81 MG tablet Take 81 mg by mouth daily.   Calcium 600-200 MG-UNIT tablet Take 1 tablet by mouth 2 (two) times daily.   chlorpheniramine 4 MG tablet Commonly known as: CHLOR-TRIMETON Take 4 mg by mouth daily.   docusate sodium 100 MG capsule Commonly known as: COLACE Take 1 capsule (100 mg total) by mouth 2 (two) times daily.   fluocinonide cream 0.05 % Commonly known as: LIDEX Apply 1 application topically 2 (two) times daily as needed (for skin irritation).   gabapentin 300 MG capsule Commonly known as: NEURONTIN Take 600 mg by mouth 3 (three) times daily.    hydrochlorothiazide 25 MG tablet Commonly known as: HYDRODIURIL Take 25 mg by mouth daily.   meclizine 25 MG tablet Commonly known as: ANTIVERT Take by mouth.   potassium chloride SA 20 MEQ tablet Commonly known as: KLOR-CON Take 20 mEq by mouth daily.   pravastatin 20 MG tablet Commonly known as: PRAVACHOL Take 20 mg by mouth every evening.   psyllium 58.6 % packet  Commonly known as: METAMUCIL Take 1 packet by mouth daily.   tadalafil 20 MG tablet Commonly known as: CIALIS Take 1 tablet (20 mg total) by mouth daily as needed for erectile dysfunction.   tamsulosin 0.4 MG Caps capsule Commonly known as: FLOMAX Take 1 capsule (0.4 mg total) by mouth daily.   triamcinolone 0.025 % cream Commonly known as: KENALOG Apply 1 application topically daily as needed for dry skin.   vitamin B-12 1000 MCG tablet Commonly known as: CYANOCOBALAMIN Take 1,000 mcg by mouth daily.       Allergies: No Known Allergies  Family History: Family History  Problem Relation Age of Onset  . Hypertension Father   . Stroke Father   . Colon cancer Father   . Aortic aneurysm Mother   . Prostate cancer Neg Hx   . Kidney cancer Neg Hx   . Bladder Cancer Neg Hx     Social History:  reports that he quit smoking about 45 years ago. His smoking use included cigarettes. He has never used smokeless tobacco. He reports that he does not drink alcohol and does not use drugs.  ROS: For pertinent review of systems please refer to history of present illness  Physical Exam: BP 115/61   Pulse 85   Ht 5' 3"  (1.6 m)   Wt 215 lb (97.5 kg)   BMI 38.09 kg/m   Constitutional:  Well nourished. Alert and oriented, No acute distress. HEENT: [place] AT, mask in place.  Trachea midline Cardiovascular: No clubbing, cyanosis, or edema. Respiratory: Normal respiratory effort, no increased work of breathing. Neurologic: Grossly intact, no focal deficits, moving all 4 extremities. Psychiatric: Normal mood and  affect.  Laboratory Data:  Specimen:  Blood  Ref Range & Units 1 mo ago  WBC (White Blood Cell Count) 4.1 - 10.2 10^3/uL 8.9   RBC (Red Blood Cell Count) 4.69 - 6.13 10^6/uL 4.48Low   Hemoglobin 14.1 - 18.1 gm/dL 13.4Low   Hematocrit 40.0 - 52.0 % 42.4   MCV (Mean Corpuscular Volume) 80.0 - 100.0 fl 94.6   MCH (Mean Corpuscular Hemoglobin) 27.0 - 31.2 pg 29.9   MCHC (Mean Corpuscular Hemoglobin Concentration) 32.0 - 36.0 gm/dL 31.6Low   Platelet Count 150 - 450 10^3/uL 383   RDW-CV (Red Cell Distribution Width) 11.6 - 14.8 % 13.9   MPV (Mean Platelet Volume) 9.4 - 12.4 fl 9.4   Resulting Agency  [facility] - [facility]  Specimen Collected: 11/27/20 2:58 PM Last Resulted: 11/27/20 3:32 PM  Received From: [place]  Result Received: 12/06/20 10:01 AM   Specimen:  Blood  Ref Range & Units 1 mo ago  Glucose 70 - 110 mg/dL 94   Sodium 136 - 145 mmol/L 138   Potassium 3.6 - 5.1 mmol/L 4.9   Chloride 97 - 109 mmol/L 101   Carbon Dioxide (CO2) 22.0 - 32.0 mmol/L 30.2   Calcium 8.7 - 10.3 mg/dL 9.9   Urea Nitrogen (BUN) 7 - 25 mg/dL 22   Creatinine 0.7 - 1.3 mg/dL 1.2   Glomerular Filtration Rate (eGFR), MDRD Estimate >60 mL/min/1.73sq m 71   BUN/Crea Ratio 6.0 - 20.0 18.3   Anion Gap w/K 6.0 - 16.0 11.7   Resulting Agency  [facility] - [facility]  Specimen Collected: 11/27/20 2:58 PM Last Resulted: 11/27/20 4:41 PM  Received From: [facility]  Result Received: 12/06/20 10:01 AM   Specimen:  Blood  Ref Range & Units 2 mo ago  LDL  Cholesterol Direct - [facility] 0 - 99 mg/dL 98   Resulting Agency  [facility]   Narrative  Performed at: [facility]  [address] [place], [place] [id number]  Lab Director: Rush Farmer MD, Phone: [phone] Specimen Collected: 10/21/20 9:47 AM Last Resulted: 10/22/20 5:36 AM  Received From: [facility]  Result Received: 12/06/20 10:01 AM        Urinalysis Component     Latest Ref Rng & Units 07/02/2020  Specific Gravity, UA     1.005 - 1.030 1.020  pH, UA     5.0 - 7.5 5.0  Color, UA     Yellow Yellow  Appearance Ur     Clear Clear  Leukocytes,UA     Negative Negative  Protein,UA     Negative/Trace Negative  Glucose, UA     Negative Negative  Ketones, UA     Negative Trace (A)  RBC, UA     Negative Trace (A)  Bilirubin, UA     Negative Negative  Urobilinogen, Ur     0.2 - 1.0 mg/dL 0.2  Nitrite, UA     Negative Negative  Microscopic Examination      See below:   Component     Latest Ref Rng & Units 07/02/2020  WBC, UA     0 - 5 /hpf 0-5  RBC     0 - 2 /hpf 0-2  Epithelial Cells (non renal)     0 - 10 /hpf 0-10  Bacteria, UA     None seen/Few None seen   Component     Latest Ref Rng & Units 10/20/2016 10/20/2016 01/14/2017 07/19/2017        10:51 AM 10:51 AM    Prostate Specific Ag, Serum     0.0 - 4.0 ng/mL 8.4 (H) 8.4 (H) 2.6 3.3   Component     Latest Ref Rng & Units 02/01/2018 03/03/2018 03/03/2018 06/13/2018         11:10 AM 11:10 AM   Prostate Specific Ag, Serum     0.0 - 4.0 ng/mL 5.2 (H) 5.0 (H) 5.1 (H) 5.3 (H)   Component     Latest Ref Rng & Units 06/26/2019 12/19/2019 06/25/2020 10/03/2020             Prostate Specific Ag, Serum     0.0 - 4.0 ng/mL <0.1 <0.1 0.2 0.3   Component     Latest Ref Rng & Units 01/02/2021          Prostate Specific Ag, Serum     0.0 - 4.0 ng/mL 0.2    I have reviewed the labs.  Pertinent Imaging:  Ref. Range 07/02/2020 8:31  Scan Result Unknown 0    Assessment & Plan:    1. Prostate Cancer [facility]) Gleason 3+4 intermediate risk disease, cT2 - seed implant 10/2018 with 6 months ADT PSA is 0.2 RTC in 6 months for PSA   2. BPH with obstruction/lower urinary tract symptoms IPSS score is 7/1, it is improved Continue conservative management, avoiding bladder irritants and timed voiding's Continue tamsulosin 0.4 mg daily Most bothersome symptom is  urgency RTC in 6 months for IPSS, PSA and exam   3. Urgency We will give a trial of Gemtesa 75 mg daily, #25 samples given He states he will call the office if he would like a prescription of the medication  3. Erectile dysfunction of organic origin SHIM score is 18, it is improved Continue tadalafil 20 mg  RTC in 6 months for repeat SHIM score and exam   Return in about 6 months (around 07/03/2021) for IPSS, SHIM, PSA and exam.  These notes generated with voice recognition software. I apologize for typographical errors.  Zara Council, PA-C [facility] [address]  [place], [place] [address] [phone]

## 2021-01-03 ENCOUNTER — Other Ambulatory Visit: Payer: Self-pay

## 2021-01-03 ENCOUNTER — Ambulatory Visit (INDEPENDENT_AMBULATORY_CARE_PROVIDER_SITE_OTHER): Payer: Medicare PPO | Admitting: Urology

## 2021-01-03 ENCOUNTER — Encounter: Payer: Self-pay | Admitting: Urology

## 2021-01-03 VITALS — BP 115/61 | HR 85 | Ht 63.0 in | Wt 215.0 lb

## 2021-01-03 DIAGNOSIS — R3915 Urgency of urination: Secondary | ICD-10-CM | POA: Diagnosis not present

## 2021-01-03 DIAGNOSIS — N401 Enlarged prostate with lower urinary tract symptoms: Secondary | ICD-10-CM | POA: Diagnosis not present

## 2021-01-03 DIAGNOSIS — C61 Malignant neoplasm of prostate: Secondary | ICD-10-CM | POA: Diagnosis not present

## 2021-01-03 DIAGNOSIS — N138 Other obstructive and reflux uropathy: Secondary | ICD-10-CM

## 2021-01-03 DIAGNOSIS — N529 Male erectile dysfunction, unspecified: Secondary | ICD-10-CM

## 2021-01-03 DIAGNOSIS — R9721 Rising PSA following treatment for malignant neoplasm of prostate: Secondary | ICD-10-CM

## 2021-01-03 LAB — PSA: Prostate Specific Ag, Serum: 0.2 ng/mL (ref 0.0–4.0)

## 2021-01-03 MED ORDER — GEMTESA 75 MG PO TABS: 75.0000 mg | ORAL_TABLET | Freq: Every day | ORAL | 0 refills | Status: DC

## 2021-01-20 ENCOUNTER — Other Ambulatory Visit: Payer: Self-pay | Admitting: Family Medicine

## 2021-01-20 ENCOUNTER — Telehealth: Payer: Self-pay | Admitting: Urology

## 2021-01-20 DIAGNOSIS — R3915 Urgency of urination: Secondary | ICD-10-CM

## 2021-01-20 MED ORDER — GEMTESA 75 MG PO TABS: 75.0000 mg | ORAL_TABLET | Freq: Every day | ORAL | 0 refills | Status: DC

## 2021-01-20 NOTE — Telephone Encounter (Signed)
Patient called requesting a refill on the [place]. RX was sent to [facility]. Patient was informed of the steps that will be taken to get the RX. Patient voiced understanding.

## 2021-01-20 NOTE — Telephone Encounter (Signed)
Spoke to patient and he is aware of the new pharmacy for this medication.

## 2021-01-20 NOTE — Telephone Encounter (Signed)
Pt called office to let us know that trial of Gemtesa 75 mg daily has worked well for him and he would like a prescription sent in to [facility].

## 2021-01-29 ENCOUNTER — Other Ambulatory Visit: Payer: Self-pay

## 2021-01-29 DIAGNOSIS — R3915 Urgency of urination: Secondary | ICD-10-CM

## 2021-01-29 MED ORDER — GEMTESA 75 MG PO TABS: 75.0000 mg | ORAL_TABLET | Freq: Every day | ORAL | 6 refills | Status: DC

## 2021-01-29 NOTE — Telephone Encounter (Signed)
Patient called today to check on the status of his [place]. It appears that the original script was not sent, the class was sample. The script was printed today and manually faxed to the specialty pharmacy [facility]. When the update is received from them in regards to PA status and copayment we will call to update Richard Alexander. He states he is out of samples currently. 2 bottles were left at the front desk for him to pick up while we await the PA process

## 2021-01-30 ENCOUNTER — Telehealth: Payer: Self-pay

## 2021-01-30 DIAGNOSIS — R3915 Urgency of urination: Secondary | ICD-10-CM

## 2021-01-30 MED ORDER — GEMTESA 75 MG PO TABS: 75.0000 mg | ORAL_TABLET | Freq: Every day | ORAL | 6 refills | Status: DC

## 2021-01-30 NOTE — Telephone Encounter (Signed)
Incoming call from pt stating that he is having a hard time getting his rx for Gemtesa. Pt states that RX was sent to a mail order pharmacy however he would prefer that it go to his local pharmacy [facility] regardless of cost. RX sent.

## 2021-03-27 ENCOUNTER — Inpatient Hospital Stay: Payer: Medicare PPO | Attending: Radiation Oncology

## 2021-03-27 DIAGNOSIS — C61 Malignant neoplasm of prostate: Secondary | ICD-10-CM | POA: Diagnosis present

## 2021-03-27 LAB — PSA: Prostatic Specific Antigen: 0.17 ng/mL (ref 0.00–4.00)

## 2021-04-03 ENCOUNTER — Ambulatory Visit
Admission: RE | Admit: 2021-04-03 | Discharge: 2021-04-03 | Disposition: A | Discharge status: Home / Self Care | Payer: Medicare PPO | Source: Ambulatory Visit | Attending: Radiation Oncology | Admitting: Radiation Oncology

## 2021-04-03 ENCOUNTER — Encounter: Payer: Self-pay | Admitting: Radiation Oncology

## 2021-04-03 ENCOUNTER — Other Ambulatory Visit: Payer: Self-pay

## 2021-04-03 DIAGNOSIS — Z923 Personal history of irradiation: Secondary | ICD-10-CM | POA: Insufficient documentation

## 2021-04-03 DIAGNOSIS — C61 Malignant neoplasm of prostate: Secondary | ICD-10-CM | POA: Insufficient documentation

## 2021-04-03 NOTE — Progress Notes (Signed)
Radiation Oncology Follow up Note  Name: Richard Alexander   Date:   04/03/2021 MRN:  [id number] DOB: April 13, 1941    This 80 y.o. male presents to the clinic today for 2-1/2-year follow-up status post I-125 interstitial implant for Gleason 7 adenocarcinoma the prostate.  REFERRING PROVIDER: Ezequiel Kayser, MD  HPI: Patient is a 80 year old male now out 2-1/2 years having pleated I-125 interstitial implant for Gleason 7 adenocarcinoma the prostate.  Seen today in routine follow-up he is doing well specifically denies any increased lower urinary tract symptoms diarrhea or fatigue.  He is currently on Flomax.  Does have some urgency.  His most recent PSA is 0.17.  Which continues a downward trend.  COMPLICATIONS OF TREATMENT: none  FOLLOW UP COMPLIANCE: keeps appointments   PHYSICAL EXAM:  BP (P) 136/64 (BP Location: Left Arm, Patient Position: Sitting)   Pulse (P) 80   Temp (!) (P) 97.3 F (36.3 C)   Resp (P) 18   Wt (P) 219 lb 6.4 oz (99.5 kg)   BMI (P) 38.86 kg/m  Well-developed well-nourished patient in NAD. HEENT reveals PERLA, EOMI, discs not visualized.  Oral cavity is clear. No oral mucosal lesions are identified. Neck is clear without evidence of cervical or supraclavicular adenopathy. Lungs are clear to A&P. Cardiac examination is essentially unremarkable with regular rate and rhythm without murmur rub or thrill. Abdomen is benign with no organomegaly or masses noted. Motor sensory and DTR levels are equal and symmetric in the upper and lower extremities. Cranial nerves II through XII are grossly intact. Proprioception is intact. No peripheral adenopathy or edema is identified. No motor or sensory levels are noted. Crude visual fields are within normal range.  RADIOLOGY RESULTS: No current films to review  PLAN: Present time patient is under excellent biochemical control of his prostate cancer minimal symptoms.  I am pleased with his overall progress.  Of asked to see him back in 1  year for follow-up.  Patient knows to call with any concerns.  I would like to take this opportunity to thank you for allowing me to participate in the care of your patient.Noreene Filbert, MD

## 2021-04-07 ENCOUNTER — Other Ambulatory Visit: Payer: Self-pay

## 2021-04-07 DIAGNOSIS — R3915 Urgency of urination: Secondary | ICD-10-CM

## 2021-04-07 MED ORDER — GEMTESA 75 MG PO TABS: 75.0000 mg | ORAL_TABLET | Freq: Every day | ORAL | 1 refills | Status: DC

## 2021-04-07 NOTE — Telephone Encounter (Signed)
[facility] called stating patient would like a 90 day fill, script sent

## 2021-06-24 ENCOUNTER — Other Ambulatory Visit: Payer: Self-pay | Admitting: Orthopedic Surgery

## 2021-06-24 ENCOUNTER — Other Ambulatory Visit (HOSPITAL_COMMUNITY): Payer: Self-pay | Admitting: Orthopedic Surgery

## 2021-06-24 DIAGNOSIS — M4802 Spinal stenosis, cervical region: Secondary | ICD-10-CM

## 2021-06-24 DIAGNOSIS — M5412 Radiculopathy, cervical region: Secondary | ICD-10-CM

## 2021-06-24 DIAGNOSIS — M5030 Other cervical disc degeneration, unspecified cervical region: Secondary | ICD-10-CM

## 2021-07-03 ENCOUNTER — Other Ambulatory Visit: Payer: Self-pay

## 2021-07-03 ENCOUNTER — Ambulatory Visit
Admission: RE | Admit: 2021-07-03 | Discharge: 2021-07-03 | Disposition: A | Discharge status: Home / Self Care | Payer: Medicare PPO | Source: Ambulatory Visit | Attending: Orthopedic Surgery | Admitting: Orthopedic Surgery

## 2021-07-03 DIAGNOSIS — M4802 Spinal stenosis, cervical region: Secondary | ICD-10-CM | POA: Diagnosis not present

## 2021-07-03 DIAGNOSIS — M5412 Radiculopathy, cervical region: Secondary | ICD-10-CM | POA: Diagnosis present

## 2021-07-03 DIAGNOSIS — M5030 Other cervical disc degeneration, unspecified cervical region: Secondary | ICD-10-CM | POA: Insufficient documentation

## 2021-07-03 NOTE — Progress Notes (Deleted)
10:02 AM   Richard Alexander 04/08/1941 [id number]  Referring provider: Ezequiel Kayser, MD Richard Alexander,  [place] [address]  Urological history: 1. Prostate cancer -PSA 0.17 03/2021 -prostate biopsy on 08/12/2018 for an elevated PSA rising on finasteride, 5.3 at the time of biopsy (10.6 adjusted).  He was also noted to have induration on the right side of his prostate gland on rectal exam.  Transrectal ultrasound volume 26 g.  Prostate biopsy showed 7/12 cores positive of Gleason 3+4 and 3+3 prostate cancer.  His primary tumor was on the right with 3 cores of Gleason 3+4 involving up to 86% of the tissue right apex and mid gland.  The remainder of the prostate showed low volume Gleason 3+3 dispersed throughout the prostate -Patient had 1-125 prostate seeds implanted on 11/07/2018 -ADT completed 10/2019  2. BPH with LU TS - I PSS: 7/1 - taking tamsulosin 0.4 mg daily  3. ED - SHIM: 18 - taking tadalafil 20 mg on demand dosing   No chief complaint on file.    HPI: [name] is a 80 y.o. male who presents today for a follow up on his prostate cancer.       Score:  1-7 Mild 8-19 Moderate 20-35 Severe      Score: 1-7 Severe ED 8-11 Moderate ED 12-16 Mild-Moderate ED 17-21 Mild ED 22-25 No ED     PMH: Past Medical History:  Diagnosis Date   Allergic rhinitis    Arthritis    DDD (degenerative disc disease), cervical    Hypertension    Presence of permanent cardiac pacemaker    Sleep apnea    CPAP    Surgical History: Past Surgical History:  Procedure Laterality Date   adenomatous polyp  06/08/2018   ANTERIOR CERVICAL DISCECTOMY  ?   BACK SURGERY     COLONOSCOPY WITH PROPOFOL N/A 07/25/2018   Procedure: COLONOSCOPY WITH PROPOFOL;  Surgeon: Manya Silvas, MD;  Location: [facility] ENDOSCOPY;  Service: Endoscopy;  Laterality: N/A;   HAMMER TOE SURGERY Right 2007   2nd toe   INSERT / REPLACE / REMOVE PACEMAKER      LUMBAR LAMINECTOMY/DECOMPRESSION MICRODISCECTOMY Bilateral 10/28/2016   Procedure: LUMBAR LAMINECTOMY/DECOMPRESSION MICRODISCECTOMY 3 LEVELS;  Surgeon: Meade Maw, MD;  Location: [facility] ORS;  Service: Neurosurgery;  Laterality: Bilateral;  L3-4, L4-5,L5-S1 Laminectomies and bilateral foraminotomies   PACEMAKER INSERTION  2011   PPM GENERATOR CHANGEOUT N/A 12/17/2020   Procedure: PPM GENERATOR CHANGEOUT;  Surgeon: Isaias Cowman, MD;  Location: [facility];  Service: Cardiovascular;  Laterality: N/A;   RADIOACTIVE SEED IMPLANT N/A 11/07/2018   Procedure: RADIOACTIVE SEED IMPLANT/BRACHYTHERAPY IMPLANT;  Surgeon: Hollice Espy, MD;  Location: [facility] ORS;  Service: Urology;  Laterality: N/A;    Home Medications:  Allergies as of 07/04/2021   No Known Allergies      Medication List        Accurate as of July 03, 2021 10:02 AM. If you have any questions, ask your nurse or doctor.          acetaminophen 500 MG tablet Commonly known as: TYLENOL Take 1,000 mg by mouth every 6 (six) hours as needed (for pain.).   aspirin EC 81 MG tablet Take 81 mg by mouth daily.   Calcium 600-200 MG-UNIT tablet Take 1 tablet by mouth 2 (two) times daily.   chlorpheniramine 4 MG tablet Commonly known as: CHLOR-TRIMETON Take 4 mg by mouth daily.   docusate sodium 100  MG capsule Commonly known as: COLACE Take 1 capsule (100 mg total) by mouth 2 (two) times daily.   fluocinonide cream 0.05 % Commonly known as: LIDEX Apply 1 application topically 2 (two) times daily as needed (for skin irritation).   gabapentin 300 MG capsule Commonly known as: NEURONTIN Take 600 mg by mouth 3 (three) times daily.   Gemtesa 75 MG Tabs Generic drug: Vibegron Take 75 mg by mouth daily.   hydrochlorothiazide 25 MG tablet Commonly known as: HYDRODIURIL Take 25 mg by mouth daily.   meclizine 25 MG tablet Commonly known as: ANTIVERT Take by mouth.   potassium chloride SA 20 MEQ  tablet Commonly known as: KLOR-CON Take 20 mEq by mouth daily.   pravastatin 20 MG tablet Commonly known as: PRAVACHOL Take 20 mg by mouth every evening.   psyllium 58.6 % packet Commonly known as: METAMUCIL Take 1 packet by mouth daily.   tadalafil 20 MG tablet Commonly known as: CIALIS Take 1 tablet (20 mg total) by mouth daily as needed for erectile dysfunction.   tamsulosin 0.4 MG Caps capsule Commonly known as: FLOMAX Take 1 capsule (0.4 mg total) by mouth daily.   triamcinolone 0.025 % cream Commonly known as: KENALOG Apply 1 application topically daily as needed for dry skin.   vitamin B-12 1000 MCG tablet Commonly known as: CYANOCOBALAMIN Take 1,000 mcg by mouth daily.        Allergies: No Known Allergies  Family History: Family History  Problem Relation Age of Onset   Hypertension Father    Stroke Father    Colon cancer Father    Aortic aneurysm Mother    Prostate cancer Neg Hx    Kidney cancer Neg Hx    Bladder Cancer Neg Hx     Social History:  reports that he quit smoking about 45 years ago. His smoking use included cigarettes. He has never used smokeless tobacco. He reports that he does not drink alcohol and does not use drugs.  ROS: For pertinent review of systems please refer to history of present illness  Physical Exam: There were no vitals taken for this visit.  Constitutional:  Well nourished. Alert and oriented, No acute distress. HEENT: [place] AT, moist mucus membranes.  Trachea midline Cardiovascular: No clubbing, cyanosis, or edema. Respiratory: Normal respiratory effort, no increased work of breathing. GI: Abdomen is soft, non tender, non distended, no abdominal masses. Liver and spleen not palpable.  No hernias appreciated.  Stool sample for occult testing is not indicated.   GU: No CVA tenderness.  No bladder fullness or masses.  Patient with circumcised/uncircumcised phallus. ***Foreskin easily retracted***  Urethral meatus is patent.   No penile discharge. No penile lesions or rashes. Scrotum without lesions, cysts, rashes and/or edema.  Testicles are located scrotally bilaterally. No masses are appreciated in the testicles. Left and right epididymis are normal. Rectal: Patient with  normal sphincter tone. Anus and perineum without scarring or rashes. No rectal masses are appreciated. Prostate is approximately *** grams, *** nodules are appreciated. Seminal vesicles are normal. Skin: No rashes, bruises or suspicious lesions. Lymph: No inguinal adenopathy. Neurologic: Grossly intact, no focal deficits, moving all 4 extremities. Psychiatric: Normal mood and affect.   Laboratory Data: Component     Latest Ref Rng & Units 10/20/2016 10/20/2016 01/14/2017 07/19/2017        10:51 AM 10:51 AM    Prostate Specific Ag, Serum     0.0 - 4.0 ng/mL 8.4 (H) 8.4 (H) 2.6 3.3  Component     Latest Ref Rng & Units 02/01/2018 03/03/2018 03/03/2018 06/13/2018         11:10 AM 11:10 AM   Prostate Specific Ag, Serum     0.0 - 4.0 ng/mL 5.2 (H) 5.0 (H) 5.1 (H) 5.3 (H)   Component     Latest Ref Rng & Units 06/26/2019 12/19/2019 06/25/2020 10/03/2020             Prostate Specific Ag, Serum     0.0 - 4.0 ng/mL <0.1 <0.1 0.2 0.3   Component     Latest Ref Rng & Units 01/02/2021          Prostate Specific Ag, Serum     0.0 - 4.0 ng/mL 0.2   Component     Latest Ref Rng & Units 03/03/2019 09/15/2019 03/28/2020 03/27/2021  Prostatic Specific Antigen     0.00 - 4.00 ng/mL 0.08 0.13 0.20 0.17   WBC (White Blood Cell Count) 4.1 - 10.2 10^3/uL 8.0   RBC (Red Blood Cell Count) 4.69 - 6.13 10^6/uL 4.18 Low    Hemoglobin 14.1 - 18.1 gm/dL 13.0 Low    Hematocrit 40.0 - 52.0 % 39.7 Low    MCV (Mean Corpuscular Volume) 80.0 - 100.0 fl 95.0   MCH (Mean Corpuscular Hemoglobin) 27.0 - 31.2 pg 31.1   MCHC (Mean Corpuscular Hemoglobin Concentration) 32.0 - 36.0 gm/dL 32.7   Platelet Count 150 - 450 10^3/uL 305   RDW-CV (Red Cell Distribution Width) 11.6 -  14.8 % 14.3   MPV (Mean Platelet Volume) 9.4 - 12.4 fl 7.9 Low    Neutrophils 1.50 - 7.80 10^3/uL 5.70   Lymphocytes 1.00 - 3.60 10^3/uL 1.70   Mixed Count 0.10 - 0.90 10^3/uL 0.60   Neutrophil % 32.0 - 70.0 % 71.2 High    Lymphocyte % 10.0 - 50.0 % 21.3   Mixed % 3.0 - 14.4 % 7.5   Resulting Agency  [facility] - LAB  Specimen Collected: 05/27/21 16:23 Last Resulted: 05/27/21 16:36  Received From: [place]  Result Received: 06/02/21 09:49   Glucose 70 - 110 mg/dL 91   Sodium 136 - 145 mmol/L 143   Potassium 3.6 - 5.1 mmol/L 4.4   Chloride 97 - 109 mmol/L 106   Carbon Dioxide (CO2) 22.0 - 32.0 mmol/L 30.6   Urea Nitrogen (BUN) 7 - 25 mg/dL 15   Creatinine 0.7 - 1.3 mg/dL 1.0   Glomerular Filtration Rate (eGFR), MDRD Estimate >60 mL/min/1.73sq m 88   Calcium 8.7 - 10.3 mg/dL 10.5 High    AST  8 - 39 U/L 27   ALT  6 - 57 U/L 19   Alk Phos (alkaline Phosphatase) 34 - 104 U/L 82   Albumin 3.5 - 4.8 g/dL 4.1   Bilirubin, Total 0.3 - 1.2 mg/dL 0.6   Protein, Total 6.1 - 7.9 g/dL 7.0   A/G Ratio 1.0 - 5.0 gm/dL 1.4   Resulting Agency  [facility] - LAB  Specimen Collected: 05/27/21 16:23 Last Resulted: 05/28/21 12:27  Received From: [facility]  Result Received: 06/02/21 09:49   Hemoglobin A1C 4.2 - 5.6 % 6.6 High    Average Blood Glucose (Calc) mg/dL 143   Resulting Agency  [facility] - LAB   Narrative  Normal Range:    4.2 - 5.6%  Increased Risk:  5.7 - 6.4%  Diabetes:        >= 6.5%  Glycemic Control for adults  with diabetes:  <7%   Specimen Collected: 05/27/21 16:23 Last Resulted: 05/28/21 12:05  Received From: [place]  Result Received: 06/02/21 09:49   Cholesterol, Total 100 - 200 mg/dL 178   Triglyceride 35 - 199 mg/dL 106   HDL (High Density Lipoprotein) Cholesterol 29.0 - 71.0 mg/dL 53.7   LDL Calculated 0 - 130 mg/dL 103   VLDL Cholesterol mg/dL 21   Cholesterol/HDL Ratio   3.3   Resulting Agency  [facility] - LAB  Specimen Collected: 05/27/21 16:23 Last Resulted: 05/28/21 12:27  Received From: [facility]  Result Received: 06/02/21 09:49   Thyroid Stimulating Hormone (TSH) 0.450-5.330 uIU/ml uIU/mL 0.883   Resulting Agency  [facility] - LAB  Specimen Collected: 05/27/21 16:23 Last Resulted: 05/28/21 12:00  Received From: [place]  Result Received: 06/02/21 09:49  I have reviewed the labs.  Pertinent Imaging: N/A   Assessment & Plan:    1. Prostate Cancer [facility]) -Gleason 3+4 intermediate risk disease, cT2 - seed implant 10/2018 with 6 months ADT -PSA is 0.17  2. BPH with obstruction/lower urinary tract symptoms -continue tamsulosin 0.4 mg daily   3. Urgency We will give a trial of Gemtesa 75 mg daily, #25 samples given He states he will call the office if he would like a prescription of the medication  3. Erectile dysfunction of organic origin -continue tadalafil 20 mg, on-demand-dosing  No follow-ups on file.  These notes generated with voice recognition software. I apologize for typographical errors.  Zara Council, PA-C [facility] [address]  [place], [place] [address] [phone]

## 2021-07-04 ENCOUNTER — Encounter: Payer: Self-pay | Admitting: Urology

## 2021-07-04 ENCOUNTER — Ambulatory Visit: Payer: Self-pay | Admitting: Urology

## 2021-07-06 ENCOUNTER — Other Ambulatory Visit: Payer: Self-pay | Admitting: Urology

## 2021-07-06 DIAGNOSIS — R399 Unspecified symptoms and signs involving the genitourinary system: Secondary | ICD-10-CM

## 2021-07-17 NOTE — Progress Notes (Signed)
9:02 AM   Richard Alexander 08-03-1941 [id number]  Referring provider: Mickey Farber, MD [address] [facility] [place],  [place] [address]  Urological history: 1. Prostate cancer -PSA 0.17 03/2021 -prostate biopsy on 08/12/2018 for an elevated PSA rising on finasteride, 5.3 at the time of biopsy (10.6 adjusted).  He was also noted to have induration on the right side of his prostate gland on rectal exam.  Transrectal ultrasound volume 26 g.  Prostate biopsy showed 7/12 cores positive of Gleason 3+4 and 3+3 prostate cancer.  His primary tumor was on the right with 3 cores of Gleason 3+4 involving up to 86% of the tissue right apex and mid gland.  The remainder of the prostate showed low volume Gleason 3+3 dispersed throughout the prostate -Patient had 1-125 prostate seeds implanted on 11/07/2018 -ADT completed 10/2019  2. BPH with LU TS - I PSS: 3/2 - taking tamsulosin 0.4 mg daily and Gemtesa 75 mg daily   3. ED - SHIM: 15 - taking tadalafil 20 mg on demand dosing   Chief Complaint  Patient presents with   Follow-up    follow-up      HPI: Richard Alexander is a 80 y.o. male who presents today for a follow up on his prostate cancer.    He has no urinary complaints at this visit.  Patient denies any modifying or aggravating factors.  Patient denies any gross hematuria, dysuria or suprapubic/flank pain.  Patient denies any fevers, chills, nausea or vomiting.     IPSS     Row Name 07/18/21 0800         International Prostate Symptom Score   How often have you had the sensation of not emptying your bladder? Less than 1 in 5     How often have you had to urinate less than every two hours? Less than 1 in 5 times     How often have you found you stopped and started again several times when you urinated? Not at All     How often have you found it difficult to postpone urination? Less than 1 in 5 times     How often have you had a weak urinary stream? Not at  All     How often have you had to strain to start urination? Not at All     How many times did you typically get up at night to urinate? None     Total IPSS Score 3           Quality of Life due to urinary symptoms     If you were to spend the rest of your life with your urinary condition just the way it is now how would you feel about that? Mostly Satisfied              Score:  1-7 Mild 8-19 Moderate 20-35 Severe  Patient still having spontaneous erections.   He denies any pain or curvature with erections.     SHIM     Row Name 07/18/21 0840         SHIM: Over the last 6 months:   How do you rate your confidence that you could get and keep an erection? Low     When you had erections with sexual stimulation, how often were your erections hard enough for penetration (entering your partner)? Sometimes (about half the time)     During sexual intercourse, how often were you able to  maintain your erection after you had penetrated (entered) your partner? Sometimes (about half the time)     During sexual intercourse, how difficult was it to maintain your erection to completion of intercourse? Slightly Difficult     When you attempted sexual intercourse, how often was it satisfactory for you? Sometimes (about half the time)           SHIM Total Score     SHIM 15             Score: 1-7 Severe ED 8-11 Moderate ED 12-16 Mild-Moderate ED 17-21 Mild ED 22-25 No ED    PMH: Past Medical History:  Diagnosis Date   Allergic rhinitis    Arthritis    DDD (degenerative disc disease), cervical    Hypertension    Presence of permanent cardiac pacemaker    Sleep apnea    CPAP    Surgical History: Past Surgical History:  Procedure Laterality Date   adenomatous polyp  06/08/2018   ANTERIOR CERVICAL DISCECTOMY  ?   BACK SURGERY     COLONOSCOPY WITH PROPOFOL N/A 07/25/2018   Procedure: COLONOSCOPY WITH PROPOFOL;  Surgeon: Manya Silvas, MD;  Location: [facility] ENDOSCOPY;   Service: Endoscopy;  Laterality: N/A;   HAMMER TOE SURGERY Right 2007   2nd toe   INSERT / REPLACE / REMOVE PACEMAKER     LUMBAR LAMINECTOMY/DECOMPRESSION MICRODISCECTOMY Bilateral 10/28/2016   Procedure: LUMBAR LAMINECTOMY/DECOMPRESSION MICRODISCECTOMY 3 LEVELS;  Surgeon: Meade Maw, MD;  Location: [facility] ORS;  Service: Neurosurgery;  Laterality: Bilateral;  L3-4, L4-5,L5-S1 Laminectomies and bilateral foraminotomies   PACEMAKER INSERTION  2011   PPM GENERATOR CHANGEOUT N/A 12/17/2020   Procedure: PPM GENERATOR CHANGEOUT;  Surgeon: Isaias Cowman, MD;  Location: [place] CV LAB;  Service: Cardiovascular;  Laterality: N/A;   RADIOACTIVE SEED IMPLANT N/A 11/07/2018   Procedure: RADIOACTIVE SEED IMPLANT/BRACHYTHERAPY IMPLANT;  Surgeon: Hollice Espy, MD;  Location: [facility] ORS;  Service: Urology;  Laterality: N/A;    Home Medications:  Allergies as of 07/18/2021   No Known Allergies      Medication List        Accurate as of July 18, 2021 11:59 PM. If you have any questions, ask your nurse or doctor.          acetaminophen 500 MG tablet Commonly known as: TYLENOL Take 1,000 mg by mouth every 6 (six) hours as needed (for pain.).   aspirin EC 81 MG tablet Take 81 mg by mouth daily.   Calcium 600-200 MG-UNIT tablet Take 1 tablet by mouth 2 (two) times daily.   chlorpheniramine 4 MG tablet Commonly known as: CHLOR-TRIMETON Take 4 mg by mouth daily.   docusate sodium 100 MG capsule Commonly known as: COLACE Take 1 capsule (100 mg total) by mouth 2 (two) times daily.   fluocinonide cream 0.05 % Commonly known as: LIDEX Apply 1 application topically 2 (two) times daily as needed (for skin irritation).   gabapentin 300 MG capsule Commonly known as: NEURONTIN Take 600 mg by mouth 3 (three) times daily.   Gemtesa 75 MG Tabs Generic drug: Vibegron Take 75 mg by mouth daily.   hydrochlorothiazide 25 MG tablet Commonly known as: HYDRODIURIL Take 25 mg by  mouth daily.   meclizine 25 MG tablet Commonly known as: ANTIVERT Take by mouth.   potassium chloride SA 20 MEQ tablet Commonly known as: KLOR-CON Take 20 mEq by mouth daily.   pravastatin 20 MG tablet Commonly known as: PRAVACHOL Take 20 mg by mouth every  evening.   psyllium 58.6 % packet Commonly known as: METAMUCIL Take 1 packet by mouth daily.   tadalafil 20 MG tablet Commonly known as: CIALIS Take 1 tablet (20 mg total) by mouth daily as needed for erectile dysfunction.   tamsulosin 0.4 MG Caps capsule Commonly known as: FLOMAX TAKE 1 CAPSULE BY MOUTH ONCE DAILY   triamcinolone 0.025 % cream Commonly known as: KENALOG Apply 1 application topically daily as needed for dry skin.   vitamin B-12 1000 MCG tablet Commonly known as: CYANOCOBALAMIN Take 1,000 mcg by mouth daily.        Allergies: No Known Allergies  Family History: Family History  Problem Relation Age of Onset   Hypertension Father    Stroke Father    Colon cancer Father    Aortic aneurysm Mother    Prostate cancer Neg Hx    Kidney cancer Neg Hx    Bladder Cancer Neg Hx     Social History:  reports that he quit smoking about 45 years ago. His smoking use included cigarettes. He has never used smokeless tobacco. He reports that he does not drink alcohol and does not use drugs.  ROS: For pertinent review of systems please refer to history of present illness  Physical Exam: BP 135/77   Pulse 85   Ht $R'5\' 3"'vp$  (1.6 m)   Wt 215 lb (97.5 kg)   BMI 38.09 kg/m   Constitutional:  Well nourished. Alert and oriented, No acute distress. HEENT: [place] AT, mask in place.  Trachea midline Cardiovascular: No clubbing, cyanosis, or edema. Respiratory: Normal respiratory effort, no increased work of breathing. Psychiatric: Normal mood and affect.   Laboratory Data: Component     Latest Ref Rng & Units 10/20/2016 10/20/2016 01/14/2017 07/19/2017        10:51 AM 10:51 AM    Prostate Specific Ag, Serum      0.0 - 4.0 ng/mL 8.4 (H) 8.4 (H) 2.6 3.3   Component     Latest Ref Rng & Units 02/01/2018 03/03/2018 03/03/2018 06/13/2018         11:10 AM 11:10 AM   Prostate Specific Ag, Serum     0.0 - 4.0 ng/mL 5.2 (H) 5.0 (H) 5.1 (H) 5.3 (H)   Component     Latest Ref Rng & Units 06/26/2019 12/19/2019 06/25/2020 10/03/2020             Prostate Specific Ag, Serum     0.0 - 4.0 ng/mL <0.1 <0.1 0.2 0.3   Component     Latest Ref Rng & Units 01/02/2021          Prostate Specific Ag, Serum     0.0 - 4.0 ng/mL 0.2   Component     Latest Ref Rng & Units 03/03/2019 09/15/2019 03/28/2020 03/27/2021  Prostatic Specific Antigen     0.00 - 4.00 ng/mL 0.08 0.13 0.20 0.17   WBC (White Blood Cell Count) 4.1 - 10.2 10^3/uL 8.0   RBC (Red Blood Cell Count) 4.69 - 6.13 10^6/uL 4.18 Low    Hemoglobin 14.1 - 18.1 gm/dL 13.0 Low    Hematocrit 40.0 - 52.0 % 39.7 Low    MCV (Mean Corpuscular Volume) 80.0 - 100.0 fl 95.0   MCH (Mean Corpuscular Hemoglobin) 27.0 - 31.2 pg 31.1   MCHC (Mean Corpuscular Hemoglobin Concentration) 32.0 - 36.0 gm/dL 32.7   Platelet Count 150 - 450 10^3/uL 305   RDW-CV (Red Cell Distribution Width) 11.6 - 14.8 % 14.3   MPV (Mean  Platelet Volume) 9.4 - 12.4 fl 7.9 Low    Neutrophils 1.50 - 7.80 10^3/uL 5.70   Lymphocytes 1.00 - 3.60 10^3/uL 1.70   Mixed Count 0.10 - 0.90 10^3/uL 0.60   Neutrophil % 32.0 - 70.0 % 71.2 High    Lymphocyte % 10.0 - 50.0 % 21.3   Mixed % 3.0 - 14.4 % 7.5   Resulting Agency  [place] - LAB  Specimen Collected: 05/27/21 16:23 Last Resulted: 05/27/21 16:36  Received From: [place]  Result Received: 06/02/21 09:49   Glucose 70 - 110 mg/dL 91   Sodium 136 - 145 mmol/L 143   Potassium 3.6 - 5.1 mmol/L 4.4   Chloride 97 - 109 mmol/L 106   Carbon Dioxide (CO2) 22.0 - 32.0 mmol/L 30.6   Urea Nitrogen (BUN) 7 - 25 mg/dL 15   Creatinine 0.7 - 1.3 mg/dL 1.0   Glomerular Filtration Rate (eGFR), MDRD Estimate >60 mL/min/1.73sq m 88    Calcium 8.7 - 10.3 mg/dL 10.5 High    AST  8 - 39 U/L 27   ALT  6 - 57 U/L 19   Alk Phos (alkaline Phosphatase) 34 - 104 U/L 82   Albumin 3.5 - 4.8 g/dL 4.1   Bilirubin, Total 0.3 - 1.2 mg/dL 0.6   Protein, Total 6.1 - 7.9 g/dL 7.0   A/G Ratio 1.0 - 5.0 gm/dL 1.4   Resulting Agency  [facility]  Specimen Collected: 05/27/21 16:23 Last Resulted: 05/28/21 12:27  Received From: [facility]  Result Received: 06/02/21 09:49   Hemoglobin A1C 4.2 - 5.6 % 6.6 High    Average Blood Glucose (Calc) mg/dL 143   Resulting Agency  [facility] - LAB   Narrative  Normal Range:    4.2 - 5.6%  Increased Risk:  5.7 - 6.4%  Diabetes:        >= 6.5%  Glycemic Control for adults with diabetes:  <7%   Specimen Collected: 05/27/21 16:23 Last Resulted: 05/28/21 12:05  Received From: [place]  Result Received: 06/02/21 09:49   Cholesterol, Total 100 - 200 mg/dL 178   Triglyceride 35 - 199 mg/dL 106   HDL (High Density Lipoprotein) Cholesterol 29.0 - 71.0 mg/dL 53.7   LDL Calculated 0 - 130 mg/dL 103   VLDL Cholesterol mg/dL 21   Cholesterol/HDL Ratio  3.3   Resulting Agency  [facility] - LAB  Specimen Collected: 05/27/21 16:23 Last Resulted: 05/28/21 12:27  Received From: [place]  Result Received: 06/02/21 09:49   Thyroid Stimulating Hormone (TSH) 0.450-5.330 uIU/ml uIU/mL 0.883   Resulting Agency  [place] - LAB  Specimen Collected: 05/27/21 16:23 Last Resulted: 05/28/21 12:00  Received From: [place]  Result Received: 06/02/21 09:49  I have reviewed the labs.  Pertinent Imaging: N/A   Assessment & Plan:    1. Prostate Cancer [facility]) -Gleason 3+4 intermediate risk disease, cT2 - seed implant 10/2018 with 6 months ADT -PSA is 0.17  2. BPH with obstruction/lower urinary tract symptoms -continue tamsulosin 0.4 mg daily  3. Urgency -continue Gemtesa 75 mg  daily  4. Erectile dysfunction of organic origin -continue tadalafil 20 mg, on-demand-dosing  Return in 1 year (on 07/18/2022) for  for psa, ipss, shim.  These notes generated with voice recognition software. I apologize for typographical errors.  Zara Council, PA-C [facility] [address]  [place], [place] [address] [phone]  [phone]   

## 2021-07-18 ENCOUNTER — Encounter: Payer: Self-pay | Admitting: Urology

## 2021-07-18 ENCOUNTER — Other Ambulatory Visit: Payer: Self-pay

## 2021-07-18 ENCOUNTER — Ambulatory Visit: Payer: Medicare PPO | Admitting: Urology

## 2021-07-18 VITALS — BP 135/77 | HR 85 | Ht 63.0 in | Wt 215.0 lb

## 2021-07-18 DIAGNOSIS — N401 Enlarged prostate with lower urinary tract symptoms: Secondary | ICD-10-CM | POA: Diagnosis not present

## 2021-07-18 DIAGNOSIS — R3915 Urgency of urination: Secondary | ICD-10-CM

## 2021-07-18 DIAGNOSIS — C61 Malignant neoplasm of prostate: Secondary | ICD-10-CM | POA: Diagnosis not present

## 2021-07-18 DIAGNOSIS — N138 Other obstructive and reflux uropathy: Secondary | ICD-10-CM

## 2021-07-18 DIAGNOSIS — N529 Male erectile dysfunction, unspecified: Secondary | ICD-10-CM | POA: Diagnosis not present

## 2021-07-18 MED ORDER — TADALAFIL 20 MG PO TABS: 20.0000 mg | ORAL_TABLET | Freq: Every day | ORAL | 3 refills | Status: DC | PRN

## 2021-10-13 ENCOUNTER — Other Ambulatory Visit: Payer: Self-pay | Admitting: Surgery

## 2021-10-23 ENCOUNTER — Other Ambulatory Visit: Payer: Self-pay

## 2021-10-23 ENCOUNTER — Other Ambulatory Visit
Admission: RE | Admit: 2021-10-23 | Discharge: 2021-10-23 | Disposition: A | Discharge status: Home / Self Care | Payer: Medicare PPO | Source: Ambulatory Visit | Attending: Surgery | Admitting: Surgery

## 2021-10-23 DIAGNOSIS — E876 Hypokalemia: Secondary | ICD-10-CM

## 2021-10-23 DIAGNOSIS — I10 Essential (primary) hypertension: Secondary | ICD-10-CM

## 2021-10-23 HISTORY — DX: Malignant (primary) neoplasm, unspecified: C80.1

## 2021-10-23 NOTE — Patient Instructions (Addendum)
Your procedure is scheduled on: 10/28/21 Report to the Registration Desk on the 1st floor of the [facility]. To find out your arrival time, please call [phone] between 1PM - 3PM on: 10/27/21  REMEMBER: Instructions that are not followed completely may result in serious medical risk, up to and including death; or upon the discretion of your surgeon and anesthesiologist your surgery may need to be rescheduled.  Do not eat food after midnight the night before surgery.  No gum chewing, lozengers or hard candies.  You may however, drink CLEAR liquids up to 2 hours before you are scheduled to arrive for your surgery. Do not drink anything within 2 hours of your scheduled arrival time.  Clear liquids include: - water  - apple juice without pulp - gatorade (not RED, PURPLE, OR BLUE) - black coffee or tea (Do NOT add milk or creamers to the coffee or tea) Do NOT drink anything that is not on this list.  - DRINK ENSURE CLEAR CARBOHYDRATE DRINK, THIS DRINK HELPS REDUCE INSULIN RESISTANCE AND IMPROVES PATIENT OUTCOMES- COMPLETE 2 HOURS BEFORE YOUR STATED ARRIVAL TIME.  TAKE THESE MEDICATIONS THE MORNING OF SURGERY WITH A SIP OF WATER:  - gabapentin (NEURONTIN) 300 MG capsule - tamsulosin (FLOMAX) 0.4 MG CAPS capsule - Vibegron (GEMTESA) 75 MG TABS  Follow recommendations from Cardiologist, Pulmonologist or PCP regarding stopping Aspirin, Coumadin, Plavix, Eliquis, Pradaxa, or Pletal.  One week prior to surgery: Stop Anti-inflammatories (NSAIDS) such as Advil, Aleve, Ibuprofen, Motrin, Naproxen, Naprosyn and Aspirin based products such as Excedrin, Goodys Powder, BC Powder.  Stop ANY OVER THE COUNTER supplements until after surgery.  You may however, continue to take Tylenol if needed for pain up until the day of surgery.  No Alcohol for 24 hours before or after surgery.  No Smoking including e-cigarettes for 24 hours prior to surgery.  No chewable tobacco products for at least 6  hours prior to surgery.  No nicotine patches on the day of surgery.  Do not use any "recreational" drugs for at least a week prior to your surgery.  Please be advised that the combination of cocaine and anesthesia may have negative outcomes, up to and including death. If you test positive for cocaine, your surgery will be cancelled.  On the morning of surgery brush your teeth with toothpaste and water, you may rinse your mouth with mouthwash if you wish. Do not swallow any toothpaste or mouthwash.  Use CHG Soap or wipes as directed on instruction sheet.  Do not wear jewelry, make-up, hairpins, clips or nail polish.  Do not wear lotions, powders, or perfumes.   Do not shave body from the neck down 48 hours prior to surgery just in case you cut yourself which could leave a site for infection.  Also, freshly shaved skin may become irritated if using the CHG soap.  Contact lenses, hearing aids and dentures may not be worn into surgery.  Do not bring valuables to the hospital. [facility] is not responsible for any missing/lost belongings or valuables.   Notify your doctor if there is any change in your medical condition (cold, fever, infection).  Wear comfortable clothing (specific to your surgery type) to the hospital.  After surgery, you can help prevent lung complications by doing breathing exercises.  Take deep breaths and cough every 1-2 hours. Your doctor may order a device called an Incentive Spirometer to help you take deep breaths. When coughing or sneezing, hold a pillow firmly against your incision with  both hands. This is called "splinting." Doing this helps protect your incision. It also decreases belly discomfort.  If you are being admitted to the hospital overnight, leave your suitcase in the car. After surgery it may be brought to your room.  If you are being discharged the day of surgery, you will not be allowed to drive home. You will need a responsible adult (18  years or older) to drive you home and stay with you that night.   If you are taking public transportation, you will need to have a responsible adult (18 years or older) with you. Please confirm with your physician that it is acceptable to use public transportation.   Please call the [facility] at [phone] if you have any questions about these instructions.  Surgery Visitation Policy:  Patients undergoing a surgery or procedure may have one family member or support person with them as long as that person is not COVID-19 positive or experiencing its symptoms.  That person may remain in the waiting area during the procedure and may rotate out with other people.  Inpatient Visitation:    Visiting hours are 7 a.m. to 8 p.m. Up to two visitors ages 16+ are allowed at one time in a patient room. The visitors may rotate out with other people during the day. Visitors must check out when they leave, or other visitors will not be allowed. One designated support person may remain overnight. The visitor must pass COVID-19 screenings, use hand sanitizer when entering and exiting the patient's room and wear a mask at all times, including in the patient's room. Patients must also wear a mask when staff or their visitor are in the room. Masking is required regardless of vaccination status.

## 2021-10-24 ENCOUNTER — Encounter: Payer: Self-pay | Admitting: Surgery

## 2021-10-24 ENCOUNTER — Encounter: Payer: Self-pay | Admitting: Urgent Care

## 2021-10-24 ENCOUNTER — Other Ambulatory Visit
Admission: RE | Admit: 2021-10-24 | Discharge: 2021-10-24 | Disposition: A | Discharge status: Home / Self Care | Payer: Medicare PPO | Source: Ambulatory Visit | Attending: Surgery | Admitting: Surgery

## 2021-10-24 DIAGNOSIS — E876 Hypokalemia: Secondary | ICD-10-CM

## 2021-10-24 DIAGNOSIS — I10 Essential (primary) hypertension: Secondary | ICD-10-CM | POA: Diagnosis not present

## 2021-10-24 DIAGNOSIS — Z01818 Encounter for other preprocedural examination: Secondary | ICD-10-CM | POA: Insufficient documentation

## 2021-10-24 LAB — CBC
HCT: 41.5 % (ref 39.0–52.0)
Hemoglobin: 13.6 g/dL (ref 13.0–17.0)
MCH: 30.5 pg (ref 26.0–34.0)
MCHC: 32.8 g/dL (ref 30.0–36.0)
MCV: 93 fL (ref 80.0–100.0)
Platelets: 301 10*3/uL (ref 150–400)
RBC: 4.46 MIL/uL (ref 4.22–5.81)
RDW: 14.5 % (ref 11.5–15.5)
WBC: 9.2 10*3/uL (ref 4.0–10.5)
nRBC: 0 % (ref 0.0–0.2)

## 2021-10-24 LAB — BASIC METABOLIC PANEL
Anion gap: 7 (ref 5–15)
BUN: 20 mg/dL (ref 8–23)
CO2: 29 mmol/L (ref 22–32)
Calcium: 9.6 mg/dL (ref 8.9–10.3)
Chloride: 102 mmol/L (ref 98–111)
Creatinine, Ser: 0.92 mg/dL (ref 0.61–1.24)
GFR, Estimated: >60 mL/min (ref >60)
Glucose, Bld: 73 mg/dL (ref 70–99)
Potassium: 3.8 mmol/L (ref 3.5–5.1)
Sodium: 138 mmol/L (ref 135–145)

## 2021-10-24 NOTE — Progress Notes (Signed)
Perioperative Services  Pre-Admission/Anesthesia Testing Clinical Review  Date: 10/28/21  Patient Demographics:  Name: Richard Alexander DOB:   1941/06/13 MRN:   [id number]  Planned Surgical Procedure(s):    Case: [id number] Date/Time: 10/28/21 1131   Procedure: ENDOSCOPIC RIGHT CARPAL TUNNEL RELEASE AND SUBCUTANEOUS ANTERIOR TRANSPOSITION OF ULNAR NERVE AT RIGHT ELBOW. 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 Clinical nursing staff has updated patient's PMH/PSHx, current medication list, and drug allergies/intolerances to ensure comprehensive history available to assist in medical decision making as it pertains to the aforementioned surgical procedure and anticipated anesthetic course. Extensive review of available clinical information performed. [place] PMH and PSHx updated with any diagnoses/procedures that  may have been inadvertently omitted during his intake with the pre-admission testing department's nursing staff.  Clinical Discussion:  Richard Alexander is a 80 y.o. male who is submitted for pre-surgical anesthesia review and clearance prior to him undergoing the above procedure. Patient is a Former Smoker (quit 10/20/1975). Pertinent PMH includes: SA node dysfunction (s/p PPM placement), LBBB, emphysema, OSAH (requires nocturnal PAP therapy), Dolores Frame esophageal diverticulum, DDD, OA, BPH, prostate cancer (s/p brachytherapy), insomnia.  Patient is followed by cardiology Ubaldo Glassing, MD). He was last seen in the cardiology clinic on 10/01/2021; notes reviewed.  At the time of his clinic visit, patient doing well overall from a cardiovascular perspective.  He denied any episodes of chest pain, shortness breath,  PND, orthopnea, palpitations, significant peripheral edema, vertiginous symptoms, or presyncope/syncope.  PMH significant for cardiovascular diagnoses.  Patient underwent PPM placement in 2011 for treatment of his SA node dysfunction. Generator change out was performed in 11/2020.  Myocardial perfusion imaging study performed on 09/17/2016 revealed a reduced left ventricular systolic function with an EF of 45%.  There was inferoseptal hypokinesis noted.  Inferoseptal scar with mild peri-infarct ischemia noted.  Blood pressure reasonably controlled at 140/82 on currently prescribed diuretic monotherapy.  He is on a statin for his HLD.  Patient is not diabetic.  Patient is on a PDE5i (tadalafil) for his ED diagnosis.  Additionally, he has an OSAH diagnosis and is intermittently compliant with his prescribed nocturnal PAP therapy.  Patient asking to discuss Aspire device placement for his OSAH; referred to ENT. Functional capacity, as defined by DASI, is documented as being >/= 4 METS.  No changes were made to his medication regimen.  Patient to follow-up with outpatient cardiology in 6 months or sooner if needed.  Richard Alexander is scheduled for an elective RIGHT ENDOSCOPIC RIGHT CARPAL TUNNEL RELEASE AND SUBCUTANEOUS ANTERIOR TRANSPOSITION OF ULNAR NERVE AT RIGHT ELBOW on 10/28/2021 with Dr. Milagros Evener, MD. Given patient's past medical history significant for cardiovascular diagnoses, presurgical cardiac clearance was sought by the PAT team. Per cardiology, "this patient is optimized for surgery and may proceed with the planned procedural course with a MODERATE risk of significant perioperative cardiovascular complications". This patient is on daily antiplatelet therapy.  He has been instructed on recommendations for holding his daily low-dose ASA for 5 days prior to his procedure with plans to restart as soon as postoperatively respectively minimized by his primary attending surgeon.  Patient is aware that  his last dose of  ASA will be on 10/22/2021.  Patient denies previous perioperative complications with anesthesia in the past. In review of the available records, it is noted that patient underwent a MAC anesthetic course at [facility] ([facility]) in 08/2019 without documented complications.   Vitals with BMI 07/18/2021 04/03/2021 01/03/2021  Height _0  - _1   Weight 215 lbs 219 lbs 6 oz 215 lbs  BMI 73.22 - 02.54  Systolic 270 623 762  Diastolic 77 64 61  Pulse 85 80 85  Some encounter information is confidential and restricted. Go to Review Flowsheets activity to see all data.    Providers/Specialists:   NOTE: Primary physician provider listed below. Patient may have been seen by APP or partner within same practice.   PROVIDER ROLE / SPECIALTY LAST OV  Poggi, Marshall Cork, MD ORTHOPEDICS 10/08/2021  Sofie Hartigan, MD PRIMARY CARE PROVIDER 07/04/2021  Bartholome Bill, MD CARDIOLOGY 10/01/2021  Noreene Filbert, MD RADIATION ONCOLOGY 03/24/2021   Allergies:  Patient has no known allergies.  Current Home Medications:    ceFAZolin (ANCEF) IVPB 2g/100 mL premix   chlorhexidine (PERIDEX) 0.12 % solution 15 mL   Or   MEDLINE mouth rinse   famotidine (PEPCID) tablet 20 mg   lactated ringers infusion    acetaminophen (TYLENOL) 500 MG tablet   aspirin EC 81 MG tablet   Calcium 600-200 MG-UNIT tablet   chlorpheniramine (CHLOR-TRIMETON) 4 MG tablet   docusate sodium (COLACE) 100 MG capsule   fexofenadine (ALLEGRA) 180 MG tablet   fluocinonide cream (LIDEX) 0.05 %   gabapentin (NEURONTIN) 300 MG capsule   meclizine (ANTIVERT) 25 MG tablet   polyethylene glycol (MIRALAX / GLYCOLAX) 17 g packet   potassium chloride SA (K-DUR,KLOR-CON) 20 MEQ tablet   pravastatin (PRAVACHOL) 20 MG tablet   tadalafil (CIALIS) 20 MG tablet   tamsulosin (FLOMAX) 0.4 MG CAPS capsule   traMADol (ULTRAM) 50 MG tablet   triamcinolone (KENALOG) 0.025 % cream    triamterene-hydrochlorothiazide (MAXZIDE-25) 37.5-25 MG tablet   Vibegron (GEMTESA) 75 MG TABS   vitamin B-12 (CYANOCOBALAMIN) 1000 MCG tablet   History:   Past Medical History:  Diagnosis Date   Adenomatous polyp    Allergic rhinitis    Aortic atherosclerosis (HCC)    Arthritis    BPH (benign prostatic hyperplasia)    DDD (degenerative disc disease), cervical    Diuretic-induced hypokalemia    Erectile dysfunction    Esophageal diverticulum    HLD (hyperlipidemia)    Hypertension    Insomnia    LBBB (left bundle branch block)    a.) noted on MPI performed 09/17/2016   Lumbar spinal stenosis    OSA on CPAP    Photoallergic dermatitis    Presence of permanent cardiac pacemaker    a.) Medtronic device   Prostate cancer ([place])    a.) 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   COLONOSCOPY WITH PROPOFOL N/A 07/25/2018   Procedure: COLONOSCOPY WITH PROPOFOL;  Surgeon: Manya Silvas, MD;  Location[facility] END[facility]COPY;  Service: Endoscopy;  Laterality: N/A;   HAMMER TOE SURGERY Right 2007   2nd toe   LUMBAR LAMINECTOMY/DECOMPRESSION MICRODISCECTOMY Bilateral 10/28/2016   Procedure: LUMBAR LAMINECTOMY/DECOMPRESSION MICRODISCECTOMY 3 LEVELS;  Surgeon: Meade Maw, MD;  Location: [facility] ORS;  Service: Neurosurgery;  Laterality: Bilateral;  L3-4, L4-5,L5-S1 Laminectomies and bilateral foraminotomies   PACEMAKER INSERTION  2011   PPM GENERATOR CHANGEOUT N/A 12/17/2020   Procedure: PPM GENERATOR CHANGEOUT;  Surgeon: Isaias Cowman, MD;  Location: [facility];  Service: Cardiovascular;  Laterality: N/A;   RADIOACTIVE SEED IMPLANT N/A 11/07/2018   Procedure: RADIOACTIVE SEED IMPLANT/BRACHYTHERAPY IMPLANT;  Surgeon: Hollice Espy, MD;  Location: [facility] ORS;   Service: Urology;  Laterality: N/A;   Family History  Problem Relation Age of Onset   Hypertension Father    Stroke Father    Colon cancer Father    Aortic aneurysm Mother    Prostate cancer Neg Hx    Kidney cancer Neg Hx    Bladder Cancer Neg Hx    Social History   Tobacco Use   Smoking status: Former    Types: Cigarettes    Quit date: 10/20/1975    Years since quitting: 46.0   Smokeless tobacco: Never  Vaping Use   Vaping Use: Never used  Substance Use Topics   Alcohol use: No   Drug use: No    Pertinent Clinical Results:  LABS: Labs reviewed: Acceptable for surgery.  No visits with results within 3 Day(s) from this visit.  Latest known visit with results is:  Hospital Outpatient Visit on 10/24/2021  Component Date Value Ref Range Status   WBC 10/24/2021 9.2  4.0 - 10.5 K/uL Final   RBC 10/24/2021 4.46  4.22 - 5.81 MIL/uL Final   Hemoglobin 10/24/2021 13.6  13.0 - 17.0 g/dL Final   HCT 10/24/2021 41.5  39.0 - 52.0 % Final   MCV 10/24/2021 93.0  80.0 - 100.0 fL Final   MCH 10/24/2021 30.5  26.0 - 34.0 pg Final   MCHC 10/24/2021 32.8  30.0 - 36.0 g/dL Final   RDW 10/24/2021 14.5  11.5 - 15.5 % Final   Platelets 10/24/2021 301  150 - 400 K/uL Final   nRBC 10/24/2021 0.0  0.0 - 0.2 % Final   Performed at [facility], [address] [place], [place] [address]   Sodium 10/24/2021 138  135 - 145 mmol/L Final   Potassium 10/24/2021 3.8  3.5 - 5.1 mmol/L Final   Chloride 10/24/2021 102  98 - 111 mmol/L Final   CO2 10/24/2021 29  22 - 32 mmol/L Final   Glucose, Bld 10/24/2021 73  70 - 99 mg/dL Final   Glucose reference range applies only to samples taken after fasting for at least 8 hours.   BUN 10/24/2021 20  8 - 23 mg/dL Final   Creatinine, Ser 10/24/2021 0.92  0.61 - 1.24 mg/dL Final   Calcium 10/24/2021 9.6  8.9 - 10.3 mg/dL Final   GFR, Estimated 10/24/2021 >60  >60 mL/min Final   Comment: (NOTE) Calculated using the CKD-EPI Creatinine Equation  (2021)    Anion gap 10/24/2021 7  5 - 15 Final   Performed at [facility], [address] [place], [place] [address]    ECG: Date: 10/28/2021 Time ECG obtained: 0851 AM Rate: 74 bpm Rhythm:  AV dual paced Axis (leads I and aVF): Normal Intervals: PR 178 ms. QRS 158 ms. QTc 439 ms. ST segment and T wave changes: No evidence of acute ST segment elevation or depression Comparison: Similar to previous tracing obtained on 10/31/2018   IMAGING / PROCEDURES: CT CERVICAL SPINE WITHOUT CONTRAST performed on 07/03/2021 Prior C3-C4 ACDF with solid osseous fusion. No hardware complication. Progressive multilevel cervical spondylosis as described  above Moderate right neuroforaminal stenosis at C4-C5 and C5-C6.  Moderate to severe bilateral neuroforaminal stenosis at C7-T1. Ankylosis of the bilateral atlanto-occipital joints. Emphysema Dolores Frame esophageal diverticulum  CT LUMBAR SPINE WITH CONTRAST performed on 07/25/2020 Scanning is performed after intrathecal contrast administration. I think this is a slightly mixed injection with some subdural component. This does not hinder the interpretation. T12-L1: Disc bulge. Facet degeneration and ligamentous hypertrophy and calcification. 2 mm of anterolisthesis. Canal narrowing with effacement of the subarachnoid space surrounding the distal cord. No definite neural compression however. L1-2: Mild canal stenosis but without likely neural compression. L2-3: Mild canal narrowing but without likely neural compression. L3-4: Disc bulge and facet hypertrophy. Narrowing of both lateral recesses and neural foramina that could cause neural compression on either or both sides. L4-5: Previous L4 laminectomy. Endplate osteophytes and bulging of the disc. Bilateral facet hypertrophy. Stenosis of both lateral recesses and neural foramina that could cause neural compression on either or both sides. L5-S1: Previous L5 laminectomy. Bilateral facet  arthropathy with 2 mm of anterolisthesis. Newly seen pars fracture on the right. This does not appear acute however. Broad-based herniation of disc material. Bilateral foraminal encroachment left worse than right. The exiting L5 nerves could be compressed. There is subarticular lateral recess stenosis bilaterally that could compress either or both S1 nerves. I think there is probably some caudally migrated disc material behind the superior endplate of S1. Aortic atherosclerosis   MYOCARDIAL PERFUSION IMAGING STUDY (LEXISCAN) performed on 09/17/2016 LVEF 45% Mild to moderately reduced left ventricular systolic function Hypokinesis of the inferoseptal wall Moderate perfusion abnormality of moderate intensity present in the inferoseptal region consistent with scar and peri-infarct ischemia No evidence of stress-induced arrhythmia  Impression and Plan:  CALE BETHARD has been referred for pre-anesthesia review and clearance prior to him undergoing the planned anesthetic and procedural courses. Available labs, pertinent testing, and imaging results were personally reviewed by me. This patient has been appropriately cleared by cardiology with an overall MODERATE risk of significant perioperative cardiovascular complications. Completed perioperative prescription for cardiac device management documentation completed by primary cardiology team and placed on patient's chart for review by the surgical/anesthetic team on the day of his procedure.   Based on clinical review performed today (10/28/21), barring any significant acute changes in the patient's overall condition, it is anticipated that he will be able to proceed with the planned surgical intervention. Any acute changes in clinical condition may necessitate his procedure being postponed and/or cancelled. Patient will meet with anesthesia team (MD and/or CRNA) on the day of his procedure for preoperative evaluation/assessment. Questions regarding  anesthetic course will be fielded at that time.   Pre-surgical instructions were reviewed with the patient during his PAT appointment and questions were fielded by PAT clinical staff. Patient was advised that if any questions or concerns arise prior to his procedure then he should return a call to PAT and/or his surgeon's office to discuss.  Honor Loh, MSN, APRN, FNP-C, CEN [facility]  Peri-operative Services Nurse Practitioner Phone: [phone] Fax: [phone] 10/28/21 9:41 AM  NOTE: This note has been prepared using Dragon dictation software. Despite my best ability to proofread, there is always the potential that unintentional transcriptional errors may still occur from this process.

## 2021-10-28 ENCOUNTER — Encounter
Admission: RE | Disposition: A | Discharge status: Home / Self Care | Payer: Self-pay | Source: Ambulatory Visit | Attending: Surgery

## 2021-10-28 ENCOUNTER — Ambulatory Visit
Admission: RE | Admit: 2021-10-28 | Discharge: 2021-10-28 | Disposition: A | Discharge status: Home / Self Care | Payer: Medicare PPO | Source: Ambulatory Visit | Attending: Surgery | Admitting: Surgery

## 2021-10-28 ENCOUNTER — Ambulatory Visit: Payer: Medicare PPO | Admitting: Urgent Care

## 2021-10-28 ENCOUNTER — Other Ambulatory Visit: Payer: Self-pay

## 2021-10-28 ENCOUNTER — Encounter: Payer: Self-pay | Admitting: Surgery

## 2021-10-28 DIAGNOSIS — M1990 Unspecified osteoarthritis, unspecified site: Secondary | ICD-10-CM | POA: Insufficient documentation

## 2021-10-28 DIAGNOSIS — N400 Enlarged prostate without lower urinary tract symptoms: Secondary | ICD-10-CM | POA: Diagnosis not present

## 2021-10-28 DIAGNOSIS — Z87891 Personal history of nicotine dependence: Secondary | ICD-10-CM | POA: Insufficient documentation

## 2021-10-28 DIAGNOSIS — J449 Chronic obstructive pulmonary disease, unspecified: Secondary | ICD-10-CM | POA: Insufficient documentation

## 2021-10-28 DIAGNOSIS — G4733 Obstructive sleep apnea (adult) (pediatric): Secondary | ICD-10-CM | POA: Insufficient documentation

## 2021-10-28 DIAGNOSIS — I10 Essential (primary) hypertension: Secondary | ICD-10-CM | POA: Insufficient documentation

## 2021-10-28 DIAGNOSIS — Z8546 Personal history of malignant neoplasm of prostate: Secondary | ICD-10-CM | POA: Insufficient documentation

## 2021-10-28 DIAGNOSIS — J309 Allergic rhinitis, unspecified: Secondary | ICD-10-CM | POA: Diagnosis not present

## 2021-10-28 DIAGNOSIS — Z950 Presence of cardiac pacemaker: Secondary | ICD-10-CM | POA: Insufficient documentation

## 2021-10-28 DIAGNOSIS — G5621 Lesion of ulnar nerve, right upper limb: Secondary | ICD-10-CM | POA: Insufficient documentation

## 2021-10-28 DIAGNOSIS — G5601 Carpal tunnel syndrome, right upper limb: Secondary | ICD-10-CM | POA: Diagnosis present

## 2021-10-28 DIAGNOSIS — E785 Hyperlipidemia, unspecified: Secondary | ICD-10-CM | POA: Diagnosis not present

## 2021-10-28 DIAGNOSIS — M5030 Other cervical disc degeneration, unspecified cervical region: Secondary | ICD-10-CM | POA: Diagnosis not present

## 2021-10-28 HISTORY — DX: Congenital diverticulum of esophagus: Q39.6

## 2021-10-28 HISTORY — DX: Spinal stenosis, lumbar region without neurogenic claudication: M48.061

## 2021-10-28 HISTORY — DX: Benign neoplasm, unspecified site: D36.9

## 2021-10-28 HISTORY — DX: Hyperlipidemia, unspecified: E78.5

## 2021-10-28 HISTORY — DX: Hypokalemia: E87.6

## 2021-10-28 HISTORY — DX: Obstructive sleep apnea (adult) (pediatric): G47.33

## 2021-10-28 HISTORY — DX: Left bundle-branch block, unspecified: I44.7

## 2021-10-28 HISTORY — DX: Benign prostatic hyperplasia without lower urinary tract symptoms: N40.0

## 2021-10-28 HISTORY — DX: Other specified acute skin changes due to ultraviolet radiation: L56.8

## 2021-10-28 HISTORY — DX: Male erectile dysfunction, unspecified: N52.9

## 2021-10-28 HISTORY — DX: Insomnia, unspecified: G47.00

## 2021-10-28 HISTORY — DX: Emphysema, unspecified: J43.9

## 2021-10-28 HISTORY — DX: Sick sinus syndrome: I49.5

## 2021-10-28 HISTORY — PX: CARPAL TUNNEL RELEASE: SHX101

## 2021-10-28 HISTORY — DX: Malignant neoplasm of prostate: C61

## 2021-10-28 HISTORY — DX: Atherosclerosis of aorta: I70.0

## 2021-10-28 SURGERY — RELEASE, CARPAL TUNNEL, ENDOSCOPIC
Anesthesia: General | Site: Wrist | Laterality: Right

## 2021-10-28 MED ORDER — PHENYLEPHRINE HCL (PRESSORS) 10 MG/ML IV SOLN
INTRAVENOUS | Status: DC | PRN
  Administered 2021-10-28 (×2): 100 ug via INTRAVENOUS

## 2021-10-28 MED ORDER — MEPERIDINE HCL 25 MG/ML IJ SOLN: 6.2500 mg | INTRAMUSCULAR | Status: DC | PRN

## 2021-10-28 MED ORDER — CEFAZOLIN SODIUM-DEXTROSE 2-4 GM/100ML-% IV SOLN
2.0000 g | INTRAVENOUS | Status: AC
  Administered 2021-10-28: 2 g via INTRAVENOUS

## 2021-10-28 MED ORDER — FAMOTIDINE 20 MG PO TABS: 20.0000 mg | ORAL_TABLET | Freq: Once | ORAL | Status: AC

## 2021-10-28 MED ORDER — ACETAMINOPHEN 10 MG/ML IV SOLN
INTRAVENOUS | Status: DC | PRN
  Administered 2021-10-28: 1000 mg via INTRAVENOUS

## 2021-10-28 MED ORDER — LIDOCAINE HCL (CARDIAC) PF 100 MG/5ML IV SOSY
PREFILLED_SYRINGE | INTRAVENOUS | Status: DC | PRN
  Administered 2021-10-28: 60 mg via INTRAVENOUS

## 2021-10-28 MED ORDER — OXYCODONE HCL 5 MG/5ML PO SOLN: 5.0000 mg | Freq: Once | ORAL | Status: DC | PRN

## 2021-10-28 MED ORDER — CHLORHEXIDINE GLUCONATE 0.12 % MT SOLN
OROMUCOSAL | Status: AC
  Administered 2021-10-28: 15 mL via OROMUCOSAL
  Filled 2021-10-28: qty 15

## 2021-10-28 MED ORDER — ONDANSETRON HCL 4 MG/2ML IJ SOLN
INTRAMUSCULAR | Status: AC
  Filled 2021-10-28: qty 2

## 2021-10-28 MED ORDER — ONDANSETRON HCL 4 MG/2ML IJ SOLN: 4.0000 mg | Freq: Once | INTRAMUSCULAR | Status: DC | PRN

## 2021-10-28 MED ORDER — ORAL CARE MOUTH RINSE: 15.0000 mL | Freq: Once | OROMUCOSAL | Status: AC

## 2021-10-28 MED ORDER — PROPOFOL 10 MG/ML IV BOLUS
INTRAVENOUS | Status: DC | PRN
  Administered 2021-10-28: 8 mg via INTRAVENOUS
  Administered 2021-10-28: 120 mg via INTRAVENOUS

## 2021-10-28 MED ORDER — CEFAZOLIN SODIUM-DEXTROSE 2-4 GM/100ML-% IV SOLN
INTRAVENOUS | Status: AC
  Filled 2021-10-28: qty 100

## 2021-10-28 MED ORDER — DOCUSATE SODIUM 100 MG PO CAPS: 200.0000 mg | ORAL_CAPSULE | Freq: Every day | ORAL | Status: AC

## 2021-10-28 MED ORDER — DEXAMETHASONE SODIUM PHOSPHATE 10 MG/ML IJ SOLN
INTRAMUSCULAR | Status: AC
  Filled 2021-10-28: qty 1

## 2021-10-28 MED ORDER — OXYCODONE HCL 5 MG PO TABS: 5.0000 mg | ORAL_TABLET | Freq: Once | ORAL | Status: DC | PRN

## 2021-10-28 MED ORDER — HYDROCODONE-ACETAMINOPHEN 5-325 MG PO TABS: 1.0000 | ORAL_TABLET | Freq: Four times a day (QID) | ORAL | 0 refills | Status: DC | PRN

## 2021-10-28 MED ORDER — BUPIVACAINE HCL (PF) 0.5 % IJ SOLN
INTRAMUSCULAR | Status: DC | PRN
  Administered 2021-10-28: 25 mL

## 2021-10-28 MED ORDER — FENTANYL CITRATE (PF) 100 MCG/2ML IJ SOLN: 25.0000 ug | INTRAMUSCULAR | Status: DC | PRN

## 2021-10-28 MED ORDER — BUPIVACAINE HCL (PF) 0.5 % IJ SOLN
INTRAMUSCULAR | Status: AC
  Filled 2021-10-28: qty 30

## 2021-10-28 MED ORDER — 0.9 % SODIUM CHLORIDE (POUR BTL) OPTIME
TOPICAL | Status: DC | PRN
  Administered 2021-10-28: 500 mL

## 2021-10-28 MED ORDER — FAMOTIDINE 20 MG PO TABS
ORAL_TABLET | ORAL | Status: AC
  Administered 2021-10-28: 20 mg via ORAL
  Filled 2021-10-28: qty 1

## 2021-10-28 MED ORDER — KETOROLAC TROMETHAMINE 15 MG/ML IJ SOLN
INTRAMUSCULAR | Status: DC | PRN
  Administered 2021-10-28: 15 mg via INTRAVENOUS

## 2021-10-28 MED ORDER — DEXMEDETOMIDINE HCL IN NACL 400 MCG/100ML IV SOLN
INTRAVENOUS | Status: DC | PRN
  Administered 2021-10-28: 4 ug via INTRAVENOUS
  Administered 2021-10-28: 8 ug via INTRAVENOUS

## 2021-10-28 MED ORDER — LACTATED RINGERS IV SOLN: INTRAVENOUS | Status: DC

## 2021-10-28 MED ORDER — PROPOFOL 10 MG/ML IV BOLUS
INTRAVENOUS | Status: AC
  Filled 2021-10-28: qty 20

## 2021-10-28 MED ORDER — CHLORHEXIDINE GLUCONATE 0.12 % MT SOLN: 15.0000 mL | Freq: Once | OROMUCOSAL | Status: AC

## 2021-10-28 MED ORDER — ACETAMINOPHEN 10 MG/ML IV SOLN
INTRAVENOUS | Status: AC
  Filled 2021-10-28: qty 100

## 2021-10-28 MED ORDER — ONDANSETRON HCL 4 MG/2ML IJ SOLN
INTRAMUSCULAR | Status: DC | PRN
  Administered 2021-10-28: 4 mg via INTRAVENOUS

## 2021-10-28 SURGICAL SUPPLY — 38 items
APL PRP STRL LF DISP 70% ISPRP (MISCELLANEOUS) ×1
BNDG COHESIVE 4X5 TAN ST LF (GAUZE/BANDAGES/DRESSINGS) ×3 IMPLANT
BNDG ELASTIC 2X5.8 VLCR STR LF (GAUZE/BANDAGES/DRESSINGS) ×3 IMPLANT
BNDG ESMARK 4X12 TAN STRL LF (GAUZE/BANDAGES/DRESSINGS) ×3 IMPLANT
CHLORAPREP W/TINT 26 (MISCELLANEOUS) ×3 IMPLANT
CORD BIP STRL DISP 12FT (MISCELLANEOUS) ×3 IMPLANT
CUFF TOURN SGL QUICK 18X4 (TOURNIQUET CUFF) IMPLANT
DRAPE SURG 17X11 SM STRL (DRAPES) ×3 IMPLANT
FORCEPS JEWEL BIP 4-3/4 STR (INSTRUMENTS) ×3 IMPLANT
GAUZE 4X4 16PLY ~~LOC~~+RFID DBL (SPONGE) ×3 IMPLANT
GAUZE SPONGE 4X4 12PLY STRL (GAUZE/BANDAGES/DRESSINGS) ×3 IMPLANT
GAUZE XEROFORM 1X8 LF (GAUZE/BANDAGES/DRESSINGS) ×3 IMPLANT
GLOVE SURG ENC MOIS LTX SZ8 (GLOVE) ×3 IMPLANT
GLOVE SURG UNDER LTX SZ8 (GLOVE) ×3 IMPLANT
GOWN STRL REUS W/ TWL LRG LVL3 (GOWN DISPOSABLE) ×1 IMPLANT
GOWN STRL REUS W/ TWL XL LVL3 (GOWN DISPOSABLE) ×1 IMPLANT
GOWN STRL REUS W/TWL LRG LVL3 (GOWN DISPOSABLE) ×3
GOWN STRL REUS W/TWL XL LVL3 (GOWN DISPOSABLE) ×3
KIT CARPAL TUNNEL (MISCELLANEOUS) ×3
KIT ESCP INSRT D SLOT CANN KN (MISCELLANEOUS) ×1 IMPLANT
KIT TURNOVER KIT A (KITS) ×3 IMPLANT
LOOP RED MAXI  1X406MM (MISCELLANEOUS) ×2
LOOP VESSEL MAXI 1X406 RED (MISCELLANEOUS) ×1 IMPLANT
MANIFOLD NEPTUNE II (INSTRUMENTS) ×3 IMPLANT
NS IRRIG 500ML POUR BTL (IV SOLUTION) ×3 IMPLANT
PACK EXTREMITY ARMC (MISCELLANEOUS) ×3 IMPLANT
SPLINT WRIST LG LT TX990309 (SOFTGOODS) IMPLANT
SPLINT WRIST LG RT TX900304 (SOFTGOODS) IMPLANT
SPLINT WRIST M LT TX990308 (SOFTGOODS) IMPLANT
SPLINT WRIST M RT TX990303 (SOFTGOODS) IMPLANT
SPLINT WRIST XL LT TX990310 (SOFTGOODS) IMPLANT
SPLINT WRIST XL RT TX990305 (SOFTGOODS) ×3 IMPLANT
STOCKINETTE IMPERVIOUS 9X36 MD (GAUZE/BANDAGES/DRESSINGS) ×3 IMPLANT
SUT PROLENE 4 0 PS 2 18 (SUTURE) ×3 IMPLANT
SUT VIC AB 2-0 CT1 (SUTURE) ×3 IMPLANT
SUT VIC AB 3-0 SH 27 (SUTURE) ×3
SUT VIC AB 3-0 SH 27X BRD (SUTURE) ×1 IMPLANT
WATER STERILE IRR 500ML POUR (IV SOLUTION) ×3 IMPLANT

## 2021-10-28 NOTE — Anesthesia Postprocedure Evaluation (Signed)
Anesthesia Post Note  Patient: Richard Alexander  Procedure(s) Performed: ENDOSCOPIC RIGHT CARPAL TUNNEL RELEASE AND SUBCUTANEOUS ANTERIOR TRANSPOSITION OF ULNAR NERVE AT RIGHT ELBOW. (Right: Wrist)  Patient location during evaluation: PACU Anesthesia Type: General Level of consciousness: awake and alert Pain management: pain level controlled Vital Signs Assessment: post-procedure vital signs reviewed and stable Respiratory status: spontaneous breathing, nonlabored ventilation, respiratory function stable and patient connected to nasal cannula oxygen Cardiovascular status: blood pressure returned to baseline and stable Postop Assessment: no apparent nausea or vomiting Anesthetic complications: no   No notable events documented.   Last Vitals:  Vitals:   10/28/21 1735 10/28/21 1738  BP:  (!) 159/86  Pulse: (!) 59 61  Resp: 13 14  Temp: (!) 36.2 C (!) 36.1 C  SpO2: 95% 96%    Last Pain:  Vitals:   10/28/21 1738  TempSrc: Temporal  PainSc: 0-No pain                 Precious Haws Obinna Ehresman

## 2021-10-28 NOTE — Discharge Instructions (Addendum)
Orthopedic discharge instructions: Keep dressings dry and intact. Keep hand elevated above heart level. May shower after dressing removed on postop day 4 (Saturday). Cover sutures with Band-Aids after drying off. Apply ice to affected areas frequently. Take ibuprofen 600-800 mg TID with meals for 3-5 days, then as necessary. Take ES Tylenol or pain medication as prescribed when needed.  Return for follow-up in 10-14 days or as scheduled.  AMBULATORY SURGERY  DISCHARGE INSTRUCTIONS   The drugs that you were given will stay in your system until tomorrow so for the next 24 hours you should not:  Drive an automobile Make any legal decisions Drink any alcoholic beverage   You may resume regular meals tomorrow.  Today it is better to start with liquids and gradually work up to solid foods.  You may eat anything you prefer, but it is better to start with liquids, then soup and crackers, and gradually work up to solid foods.   Please notify your doctor immediately if you have any unusual bleeding, trouble breathing, redness and pain at the surgery site, drainage, fever, or pain not relieved by medication.    Additional Instructions:    Please contact your physician with any problems or Same Day Surgery at [phone], Monday through Friday 6 am to 4 pm, or [place] [facility] number at [phone].

## 2021-10-28 NOTE — Op Note (Signed)
10/28/2021  6:11 PM  Patient:   Richard Alexander  Pre-Op Diagnosis:   1.  Right carpal tunnel syndrome.  2.  Right cubital tunnel syndrome.  Post-Op Diagnosis:   Same.  Procedure:   1.  Endoscopic right carpal tunnel release.  2.  Subcutaneous anterior transposition of ulnar nerve, right elbow.  Surgeon:   Pascal Lux, MD  Assistant:   Douglass Rivers, PA-S  Anesthesia:   General LMA  Findings:   As above.  Complications:   None  EBL:   3 cc  Fluids:   1300 cc crystalloid  TT:   68 minutes at 250 mmHg  Drains:   None  Closure:   4-0 Prolene interrupted sutures for wrist and staples for elbow  Brief Clinical Note:   The patient is a 80 year old male with a long history of progressively worsening pain and paresthesias to the right hand. His symptoms have progressed despite medications, activity modification, etc. His history and examination consistent with carpal tunnel syndrome. A preoperative EMG also confirmed the presence of chronic ulnar nerve compression at the elbow consistent with cubital tunnel syndrome. The patient presents at this time for an endoscopic right carpal tunnel release as well as a subcutaneous anterior transposition of the ulnar nerve at the right elbow.   Procedure:   The patient was brought into the operating room and lain in the supine position. After adequate general laryngeal mask anesthesia was obtained, the right hand and upper extremity were prepped with ChloraPrep solution before being draped sterilely. Preoperative antibiotics were administered. A timeout was performed to verify the appropriate surgical site before the limb was exsanguinated with an Esmarch and the tourniquet inflated to 250 mmHg.   The carpal tunnel procedure was performed first. An approximately 1.5-2 cm incision was made over the volar wrist flexion crease, centered over the palmaris longus tendon. The incision was carried down through the subcutaneous tissues with care taken  to identify and protect any neurovascular structures. The distal forearm fascia was penetrated just proximal to the transverse carpal ligament. The soft tissues were released off the superficial and deep surfaces of the distal forearm fascia and this was released proximally for 3-4 cm under direct visualization.  Attention was directed distally. The Soil scientist was passed beneath the transverse carpal ligament along the ulnar aspect of the carpal tunnel and used to release any adhesions as well as to remove any adherent synovial tissue before first the smaller then the larger of the two dilators were passed beneath the transverse carpal ligament along the ulnar margin of the carpal tunnel. The slotted cannula was introduced and the endoscope was placed into the slotted cannula and the undersurface of the transverse carpal ligament visualized. The distal margin of the transverse carpal ligament was marked by placing a 25-gauge needle percutaneously at [place] cardinal point so that it entered the distal portion of the slotted cannula. Under endoscopic visualization, the transverse carpal ligament was released from proximal to distal using the end-cutting blade. A second pass was performed to ensure complete release of the ligament. The adequacy of release was verified both endoscopically and by palpation using the freer elevator.  The wound was irrigated thoroughly with sterile saline solution before being closed using 4-0 Prolene interrupted sutures. A total of 10 cc of 0.5% plain Sensorcaine was injected in and around the incision.   Next, the elbow portion of the procedure was begun. An approximately 7-8 cm curvilinear incision was made along the course of  the ulnar nerve posterior to the medial epicondyle. The incision was carried down through the subcutaneous tissues with care taken to avoid the small branches of the medial antebrachial nerve to expose the sheath overlying the cubital tunnel.   The  ulnar nerve was identified at the proximal end of the tunnel and was dissected free. The nerve was then carefully followed as the roof of the cubital tunnel was released from proximal to distal. Distally, the fascia overlying the pronator muscle was released for several centimeters. The nerve was clearly thickened just proximal to the cubital tunnel, indicating the most likely source of the nerve compression. A vessel loop was passed around the nerve and used to provide gentle traction on the nerve while circumferential dissection was carried out under loupe magnification using bipolar electrocautery and tenotomy scissors.   Once the nerve was fully mobilized, the anterior tissues were elevated as a flap just superficial to the fascia overlying the common flexor origin and a pocket created to accept the nerve. Care was taken to be sure that there was no undue tension along the nerve either proximally or distally. The nerve was carefully retracted while several 2-0 Vicryl interrupted sutures were placed to reapproximate the flap to the medial epicondylar soft tissues, thereby creating a "sling" for the ulnar nerve. The cubital tunnel itself was reapproximated using several 2-0 Vicryl interrupted sutures in order to prevent the nerve from falling back into the cubital tunnel.   The wound was copiously irrigated with sterile saline solution before the subcutaneous tissues were closed using 2-0 Vicryl interrupted sutures. The skin was closed using staples. A total of 25 cc of 0.5% plain Sensorcaine was injected in and around the two incisions to help with postoperative analgesia. Sterile bulky dressings were applied to the both the wrist and elbow before the patient was placed into a Velcro wrist immobilizer. The patient was then awakened, extubated, and returned to the recovery room in satisfactory condition after tolerating the procedure well.

## 2021-10-28 NOTE — Anesthesia Preprocedure Evaluation (Signed)
Anesthesia Evaluation  Patient identified by MRN, date of birth, ID band Patient awake    Reviewed: Allergy & Precautions, NPO status , Patient's Chart, lab work & pertinent test results  History of Anesthesia Complications Negative for: history of anesthetic complications  Airway Mallampati: II  TM Distance: >3 FB Neck ROM: Full    Dental  (+) Poor Dentition   Pulmonary sleep apnea , COPD, former smoker,    breath sounds clear to auscultation- rhonchi (-) wheezing      Cardiovascular hypertension, Pt. on medications (-) CAD, (-) Past MI, (-) Cardiac Stents and (-) CABG + dysrhythmias + pacemaker  Rhythm:Regular Rate:Normal - Systolic murmurs and - Diastolic murmurs    Neuro/Psych neg Seizures negative neurological ROS  negative psych ROS   GI/Hepatic negative GI ROS, Neg liver ROS,   Endo/Other  negative endocrine ROSneg diabetes  Renal/GU negative Renal ROS     Musculoskeletal  (+) Arthritis ,   Abdominal (+) + obese,   Peds  Hematology  (+) anemia ,   Anesthesia Other Findings Past Medical History: No date: Adenomatous polyp No date: Allergic rhinitis No date: Aortic atherosclerosis (HCC) No date: Arthritis No date: BPH (benign prostatic hyperplasia) No date: DDD (degenerative disc disease), cervical No date: Diuretic-induced hypokalemia No date: Erectile dysfunction No date: Esophageal diverticulum No date: HLD (hyperlipidemia) No date: Hypertension No date: Insomnia No date: LBBB (left bundle branch block)     Comment:  a.) noted on MPI performed 09/17/2016 No date: Lumbar spinal stenosis No date: OSA on CPAP No date: Photoallergic dermatitis No date: Presence of permanent cardiac pacemaker     Comment:  a.) Medtronic device No date: Prostate cancer ([place])     Comment:  a.) 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 No date: Pulmonary emphysema (HCC) No date: Sinoatrial node dysfunction (HCC)      Comment:  a.) s/p PPM placement   Reproductive/Obstetrics                             Anesthesia Physical Anesthesia Plan  ASA: 3  Anesthesia Plan: General   Post-op Pain Management:    Induction: Intravenous  PONV Risk Score and Plan: 1 and Ondansetron and Dexamethasone  Airway Management Planned: LMA  Additional Equipment:   Intra-op Plan:   Post-operative Plan:   Informed Consent: I have reviewed the patients History and Physical, chart, labs and discussed the procedure including the risks, benefits and alternatives for the proposed anesthesia with the patient or authorized representative who has indicated his/her understanding and acceptance.     Dental advisory given  Plan Discussed with: CRNA and Anesthesiologist  Anesthesia Plan Comments:         Anesthesia Quick Evaluation

## 2021-10-28 NOTE — Anesthesia Procedure Notes (Signed)
Procedure Name: LMA Insertion Date/Time: 10/28/2021 3:25 PM Performed by: Kerri Perches, CRNA Pre-anesthesia Checklist: Patient identified, Patient being monitored, Timeout performed, Emergency Drugs available and Suction available Patient Re-evaluated:Patient Re-evaluated prior to induction Oxygen Delivery Method: Circle system utilized Preoxygenation: Pre-oxygenation with 100% oxygen Induction Type: IV induction Ventilation: Mask ventilation without difficulty LMA: LMA inserted LMA Size: 5.0 Tube type: Oral Number of attempts: 2 Airway Equipment and Method: Patient positioned with wedge pillow Placement Confirmation: positive ETCO2 Tube secured with: Tape Dental Injury: Teeth and Oropharynx as per pre-operative assessment

## 2021-10-28 NOTE — H&P (Signed)
History of Present Illness:  Richard Alexander is a 80 y.o. male who presents for evaluation and treatment of his right hand weakness. The patient notes that he has noticed progressive weakness in his hand, especially using his thumb and index finger for a long time, but feels that he is having more difficulty over the past few months. He is having a hard time gripping objects, opening bottles, or even writing. He denies any injury to his hand. He denies any numbness or paresthesias to the fingers, but does note some soreness to the index finger for which she will take Tylenol as necessary with relief. The patient also has been told that he has a "pinched nerve" in his neck for which he has received epidural steroid injections from Dr. Alba Destine with relief. He worked for [employer] for many years, but is now retired. He denies any pain at night, and denies any difficulty with repetitive activities such as driving a vehicle.  Current Outpatient Medications:  acetaminophen (TYLENOL) 500 mg capsule Take 500 mg by mouth every 4 (four) hours as needed for Pain.   aspirin 81 MG EC tablet Take 81 mg by mouth once daily.   calcium carbonate-vitamin D3 (CALTRATE 600+D) 600 mg(1,500mg ) -400 unit tablet Take 1 tablet by mouth once daily   cyanocobalamin (VITAMIN B12) 1000 MCG tablet Take 1,000 mcg by mouth once daily   docusate (COLACE) 100 MG capsule Take 200 mg by mouth once daily   fexofenadine (ALLEGRA) 180 MG tablet Take 180 mg by mouth once daily as needed   gabapentin (NEURONTIN) 300 MG capsule TAKE 2 CAPSULES BY MOUTH THREE TIMES DAILY 180 capsule 5   GEMTESA 75 mg Tab   meclizine (ANTIVERT) 25 mg tablet Take 1 tablet (25 mg total) by mouth 3 (three) times daily as needed for Dizziness 40 tablet 1   polyethylene glycol (MIRALAX) powder Take 17 g by mouth once daily Mix in 4-8ounces of fluid prior to taking.   potassium chloride (KLOR-CON) 20 MEQ ER tablet TAKE 1 TABLET BY MOUTH ONCE DAILY 90 tablet 1    pravastatin (PRAVACHOL) 20 MG tablet Take 1 tablet (20 mg total) by mouth at bedtime For cholesterol 90 tablet 3   tadalafiL (CIALIS) 20 MG tablet Take 20 mg by mouth once daily as needed for Erectile Dysfunction   tamsulosin (FLOMAX) 0.4 mg capsule Take 0.4 mg by mouth once daily Take 30 minutes after same meal each day.   triamcinolone 0.025 % cream   triamterene-hydrochlorothiazide (MAXZIDE-25) 37.5-25 mg tablet Take 1 tablet by mouth once daily 30 tablet 11   Allergies: No Known Allergies  Past Medical History:   Allergic rhinitis   Arthritis   DDD (degenerative disc disease), cervical 09/16/2014 on 07/2003 MRI   Diuretic-induced hypokalemia 09/17/2014 (Attributed to HCTZ)   Erectile dysfunction   Essential hypertension, benign 04/18/2014   History of adenomatous polyp of colon 09/16/2014   Insomnia 09/16/2014   OSA on CPAP 09/16/2014 (9 cm)   Pacemaker   Photoallergic dermatitis   Prostate cancer (CMS-HCC) 09/30/2018  Diagnosed on 08/12/2018 biopsy; followed by Dr. Erlene Quan and Dr. Baruch Gouty   Pure hypercholesterolemia 04/18/2014   Rotator cuff syndrome of left shoulder   Sinoatrial node dysfunction 04/18/2014 (S/P pacemaker 03/10/2011)   Sleep apnea (uses CPAP occasionally)   Past Surgical History:   L3-4, L4-5,L5-S1 Laminectomies and bilateral foraminotomies  10/28/2016 (Dr Meade Maw)   COLONOSCOPY 02/03/2005 (Dr. Ivor Messier - Adenomatous Polyp)   COLONOSCOPY 06/08/2013  Adenomatous Polyp, [place] (  Father): CBF 05/2018; OV made 05/18/2018 @ 1pm w/MJohnson PA (dh)   COLONOSCOPY 07/25/2018 ([place] Adenomatous Polyp; [place] (Father) CBF 07/2023)   DISCECTOMY ANTERIOR CERVICAL W/DECOMP 03/2008 (Dr. Sherwood Gambler   EXTRACTION CATARACT EXTRACAPSULAR W/INSERTION INTRAOCULAR PROSTHESIS Left 08/23/2019  Procedure: L- EXTRACTION CATARACT EXTRACAPSULAR WITH PHACO WITH INSERTION INTRAOCULAR PROSTHESIS; Surgeon: Netta Corrigan, MD; Location: [facility] OR; Service: Ophthalmology; Laterality: Left;   EXTRACTION  CATARACT EXTRACAPSULAR W/INSERTION INTRAOCULAR PROSTHESIS Right 09/06/2019  Procedure: R - EXTRACTION CATARACT EXTRACAPSULAR WITH PHACO WITH INSERTION INTRAOCULAR PROSTHESIS; Surgeon: Netta Corrigan, MD; Location: [facility] OR; Service: Ophthalmology; Laterality: Right;   INSERT / REPLACE / REMOVE PACEMAKER   Right hallux valgus and hammertoe surgery Right 10/2003   SPINE SURGERY   Family History:   Aneurysm Mother   Diabetes type II Mother   Colon cancer Father   Stroke Father   Colon cancer Cousin   Myocardial Infarction (Heart attack) Cousin (MI in her 37's)   Social History:   Socioeconomic History:   Marital status: Married  Tobacco Use   Smoking status: Former Smoker  Types: Cigarettes   Smokeless tobacco: Never Used  Scientific laboratory technician Use: Never used  Substance and Sexual Activity   Alcohol use: No  Alcohol/week: 0.0 standard drinks   Drug use: No   Sexual activity: Defer  Social History Narrative  Married, lives with wife.  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   Review of Systems:  A comprehensive 14 point ROS was performed, reviewed, and the pertinent orthopaedic findings are documented in the HPI.  Physical Exam: Vitals:  10/08/21 0821  BP: 128/84  Weight: (!) 101.8 kg (224 lb 6.4 oz)  Height: 160 cm (5\' 3" )  PainSc: 0-No pain  PainLoc: Hand   General/Constitutional: Husky elderly male in no acute distress. Neuro/Psych: Normal mood and affect, oriented to person, place and time. Eyes: Non-icteric. Pupils are equal, round, and reactive to light, and exhibit synchronous movement. ENT: Unremarkable. Lymphatic: No palpable adenopathy. Respiratory: Lungs clear to auscultation, Normal chest excursion, No wheezes and Non-labored breathing Cardiovascular: Regular rate and rhythm. No murmurs. and No edema, swelling or tenderness, except as noted in detailed exam. Integumentary: No impressive skin lesions present, except as noted in detailed  exam. Musculoskeletal: Unremarkable, except as noted in detailed exam.  Right elbow exam: Skin inspection of the right elbow is unremarkable. No swelling, erythema, ecchymosis, abrasions, or other skin abnormalities are identified. There is no elbow effusion. He exhibits full active and passive range of motion of the elbow without any pain or catching. He has no tenderness to palpation around the elbow. He has a negative Tinel's over the cubital tunnel.  Right wrist/hand exam: Skin inspection of the right wrist and hand is notable for significant atrophy of his intrinsic muscles, especially the first dorsal interosseous muscle as well as the hypothenar muscles. No swelling, erythema, ecchymosis, abrasions, or other skin abnormalities are identified. He has no tenderness to palpation over the dorsal or palmar aspects of the wrist or hand. He exhibits full active and passive range of motion of the wrist without any pain or catching. He is able actively flex and extend all digits fully without any pain or triggering. Neurovascular exam is notable for weakness of the intrinsic muscles as he exhibits 3-3+/5 strength with intrinsic muscle testing. He exhibits 3+-4/5 strength with grip testing. Sensation is present to light touch to the tips of all digits. He has good capillary refill to all digits. He has a  negative Phalen's test, but an equivocally positive Tinel's over the carpal tunnel.  EMG results:  A recent EMG of both upper extremities is available for review. By report, the study demonstrates evidence of "bilateral ulnar neuropathy", as well as "right carpal tunnel syndrome likely grade IV". There also is evidence of a possible component of C8 radiculopathy "which cannot be ruled out". This report was reviewed by myself and discussed with the patient.  Assessment:  Carpal tunnel syndrome, right   Cubital tunnel syndrome, right    Plan: The treatment options were discussed with the patient. In  addition, patient educational materials were provided regarding the diagnosis and treatment options. Although the patient is not in a lot of discomfort, he is quite concerned by his right hand weakness and inability to grasp or hold objects, and would like to consider more aggressive treatment options. Therefore, I have recommended a surgical procedure, specifically an endoscopic right carpal tunnel release with a subcutaneous anterior transposition of the ulnar nerve at the right elbow. The procedure was discussed with the patient, as were the potential risks (including bleeding, infection, nerve and/or blood vessel injury, persistent or recurrent pain/paresthesias, persistent weakness of grip/intrinsic muscle atrophy, need for further surgery, blood clots, strokes, heart attacks and/or arhythmias, pneumonia, etc.) and benefits. The patient states his understanding and wishes to proceed. All of the patient's questions and concerns were answered. He can call any time with further concerns. He will follow up post-surgery, routine.   H&P reviewed and patient re-examined. No changes.

## 2021-10-28 NOTE — Transfer of Care (Signed)
Immediate Anesthesia Transfer of Care Note  Patient: Richard Alexander  Procedure(s) Performed: ENDOSCOPIC RIGHT CARPAL TUNNEL RELEASE AND SUBCUTANEOUS ANTERIOR TRANSPOSITION OF ULNAR NERVE AT RIGHT ELBOW. (Right: Wrist)  Patient Location: PACU  Anesthesia Type:General  Level of Consciousness: awake  Airway & Oxygen Therapy: Patient connected to face mask oxygen  Post-op Assessment: Report given to RN  Post vital signs: stable  Last Vitals:  Vitals Value Taken Time  BP    Temp    Pulse 60 10/28/21 1710  Resp 17 10/28/21 1710  SpO2 98 % 10/28/21 1710  Vitals shown include unvalidated device data.  Last Pain:  Vitals:   10/28/21 1202  TempSrc: Temporal  PainSc: 0-No pain         Complications: No notable events documented.

## 2021-10-29 ENCOUNTER — Encounter: Payer: Self-pay | Admitting: Surgery

## 2022-01-05 ENCOUNTER — Telehealth: Payer: Self-pay | Admitting: Urology

## 2022-01-05 ENCOUNTER — Other Ambulatory Visit: Payer: Self-pay | Admitting: Urology

## 2022-01-05 MED ORDER — SOLIFENACIN SUCCINATE 5 MG PO TABS: 5.0000 mg | ORAL_TABLET | Freq: Every day | ORAL | 0 refills | Status: DC

## 2022-01-05 NOTE — Telephone Encounter (Signed)
Pt calls triage line and states that the medications covered by his insurance are vesicare (generic), detrol LA (generic), and oxybutynin. He would like one of these options sent into [facility] mail order pharmacy. He will be out of Gemtesa samples by Saturday. Please advise.

## 2022-01-05 NOTE — Telephone Encounter (Signed)
Spoke with patient and he would like it sent to [facility], pt will call when he gets rx to schedule appt, per pt it takes about a week or so.

## 2022-01-05 NOTE — Telephone Encounter (Signed)
Patient called and stated that the rx Richard Alexander is too expensive (65.00 a month) He stated that he is completely out and the pharmacy gave him a generic and they did not seem to help as good as the [place] Please advise

## 2022-01-05 NOTE — Telephone Encounter (Signed)
Spoke with patient and he wasn't at home, he said the pharmacy has 3 different kinds of meds he could try in place of [place], pt will call back once he gets home with the names of the medication.

## 2022-01-09 ENCOUNTER — Other Ambulatory Visit: Payer: Self-pay

## 2022-01-09 MED ORDER — SOLIFENACIN SUCCINATE 5 MG PO TABS: 5.0000 mg | ORAL_TABLET | Freq: Every day | ORAL | 0 refills | Status: DC

## 2022-01-09 NOTE — Telephone Encounter (Signed)
Pt called back stating he gave [place] the wrong pharmacy. Pt needs medication sent to [facility]. Medication sent.

## 2022-01-12 ENCOUNTER — Other Ambulatory Visit: Payer: Self-pay

## 2022-01-12 MED ORDER — SOLIFENACIN SUCCINATE 5 MG PO TABS: 5.0000 mg | ORAL_TABLET | Freq: Every day | ORAL | 0 refills | Status: DC

## 2022-01-12 NOTE — Telephone Encounter (Signed)
Pt called stating his mail away pharmacy will take 3 weeks to get  RX sent. I sent a 30 day supply of vesicare to pt local pharmacy. Pt aware.

## 2022-01-16 ENCOUNTER — Ambulatory Visit: Payer: Self-pay | Admitting: Urology

## 2022-01-27 ENCOUNTER — Other Ambulatory Visit: Payer: Medicare PPO

## 2022-03-25 ENCOUNTER — Other Ambulatory Visit: Payer: Self-pay | Admitting: Urology

## 2022-03-31 ENCOUNTER — Inpatient Hospital Stay: Payer: Medicare PPO | Attending: Radiation Oncology

## 2022-03-31 DIAGNOSIS — C61 Malignant neoplasm of prostate: Secondary | ICD-10-CM | POA: Insufficient documentation

## 2022-03-31 LAB — PSA: Prostatic Specific Antigen: 0.19 ng/mL (ref 0.00–4.00)

## 2022-04-01 ENCOUNTER — Other Ambulatory Visit: Payer: Self-pay | Admitting: Urology

## 2022-04-01 DIAGNOSIS — R399 Unspecified symptoms and signs involving the genitourinary system: Secondary | ICD-10-CM

## 2022-04-03 ENCOUNTER — Ambulatory Visit
Admission: RE | Admit: 2022-04-03 | Discharge: 2022-04-03 | Disposition: A | Discharge status: Home / Self Care | Payer: Medicare PPO | Source: Ambulatory Visit | Attending: Radiation Oncology | Admitting: Radiation Oncology

## 2022-04-03 VITALS — BP 135/80 | HR 89 | Temp 97.7°F | Resp 18 | Ht 63.0 in | Wt 217.8 lb

## 2022-04-03 DIAGNOSIS — C61 Malignant neoplasm of prostate: Secondary | ICD-10-CM

## 2022-04-03 DIAGNOSIS — Z923 Personal history of irradiation: Secondary | ICD-10-CM | POA: Diagnosis not present

## 2022-04-03 NOTE — Progress Notes (Signed)
Radiation Oncology ?Follow up Note ? ?Name: Richard Alexander   ?Date:   04/03/2022 ?MRN:  [id number] ?DOB: [birth year]  ? ? ?This 81 y.o. male presents to the clinic today for 3-1/2-year follow-up status post I-125 interstitial implant for Gleason 7 adenocarcinoma the prostate. ? ?REFERRING PROVIDER: Ezequiel Kayser, MD ? ?HPI: Patient is a 81 year old male now out 3-1/2 years having a pleated I-125 interstitial implant for Gleason 7 adenocarcinoma the prostate.  He is seen today in routine follow-up and is doing well specifically denies any increased lower urinary tract symptoms diarrhea or fatigue.  His PSA remains.  Essentially unchanged over the past year at 0.19.  He had a CT scan of his cervical spine back in July he has had instrumentation in the past he has progressive multilevel cervical spondylosis no evidence of metastatic disease. ? ?COMPLICATIONS OF TREATMENT: none ? ?FOLLOW UP COMPLIANCE: keeps appointments  ? ?PHYSICAL EXAM:  ?BP 135/80   Pulse 89   Temp 97.7 ?F (36.5 ?C)   Resp 18   Ht '5\' 3"'$  (1.6 m)   Wt 217 lb 12.8 oz (98.8 kg)   BMI 38.58 kg/m?  ?Well-developed well-nourished patient in NAD. HEENT reveals PERLA, EOMI, discs not visualized.  Oral cavity is clear. No oral mucosal lesions are identified. Neck is clear without evidence of cervical or supraclavicular adenopathy. Lungs are clear to A&P. Cardiac examination is essentially unremarkable with regular rate and rhythm without murmur rub or thrill. Abdomen is benign with no organomegaly or masses noted. Motor sensory and DTR levels are equal and symmetric in the upper and lower extremities. Cranial nerves II through XII are grossly intact. Proprioception is intact. No peripheral adenopathy or edema is identified. No motor or sensory levels are noted. Crude visual fields are within normal range. ? ?RADIOLOGY RESULTS: CT scan of spine reviewed compatible with above-stated findings ? ?PLAN: Present time patient is under excellent biochemical control  of his prostate cancer with no evidence of progressive disease.  And pleased with his overall progress he has a low side effect profile.  Of asked to see him back in 1 year for follow-up.  At that time we will discharge him from our practice and have him follow-up with yearly PSAs with his PMD.  Patient comprehends my recommendations well. ? ?I would like to take this opportunity to thank you for allowing me to participate in the care of your patient.. ? Noreene Filbert, MD ? ?

## 2022-07-10 ENCOUNTER — Other Ambulatory Visit: Payer: Medicare PPO

## 2022-07-10 DIAGNOSIS — N138 Other obstructive and reflux uropathy: Secondary | ICD-10-CM

## 2022-07-10 DIAGNOSIS — C61 Malignant neoplasm of prostate: Secondary | ICD-10-CM

## 2022-07-11 LAB — PSA: Prostate Specific Ag, Serum: 0.1 ng/mL (ref 0.0–4.0)

## 2022-07-17 ENCOUNTER — Ambulatory Visit: Payer: Self-pay | Admitting: Urology

## 2022-07-22 NOTE — Progress Notes (Unsigned)
4:02 PM   Richard Alexander [birth year] [id number]  Referring provider: Sofie Hartigan, MD [address],  [place] [address]  Urological history: 1. Prostate cancer -PSA, 06/2022 - 0.1 -prostate biopsy on 08/12/2018 for an elevated PSA rising on finasteride, 5.3 at the time of biopsy (10.6 adjusted).  He was also noted to have induration on the right side of his prostate gland on rectal exam.  Transrectal ultrasound volume 26 g.  Prostate biopsy showed 7/12 cores positive of Gleason 3+4 and 3+3 prostate cancer.  His primary tumor was on the right with 3 cores of Gleason 3+4 involving up to 86% of the tissue right apex and mid gland.  The remainder of the prostate showed low volume Gleason 3+3 dispersed throughout the prostate -Patient had 1-125 prostate seeds implanted on 11/07/2018 -ADT completed 10/2019  2. BPH with LU TS - I PSS: *** - tamsulosin 0.4 mg daily and solifenacin 5 mg daily   3. ED - Contributing factors of age, prostate cancer, pelvic radiation, hypertension, CAD, OSA, degenerative disc disease in the lumbar area, hyperlipidemia and history of smoking - SHIM:*** - taking tadalafil 20 mg on demand dosing   No chief complaint on file.  HPI: Richard Alexander is a 81 y.o. male who presents today for a follow up on his prostate cancer.         Score:  1-7 Mild 8-19 Moderate 20-35 Severe      Score: 1-7 Severe ED 8-11 Moderate ED 12-16 Mild-Moderate ED 17-21 Mild ED 22-25 No ED    PMH: Past Medical History:  Diagnosis Date   Adenomatous polyp    Allergic rhinitis    Aortic atherosclerosis (HCC)    Arthritis    BPH (benign prostatic hyperplasia)    DDD (degenerative disc disease), cervical    Diuretic-induced hypokalemia    Erectile dysfunction    Esophageal diverticulum    HLD (hyperlipidemia)    Hypertension    Insomnia    LBBB (left bundle branch block)    a.) noted on MPI performed 09/17/2016   Lumbar spinal stenosis    OSA  on CPAP    Photoallergic dermatitis    Presence of permanent cardiac pacemaker    a.) Medtronic device   Prostate cancer ([place])    a.) 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   Pulmonary emphysema (HCC)    Sinoatrial node dysfunction ([place])    a.) s/p PPM placement    Surgical History: Past Surgical History:  Procedure Laterality Date   adenomatous polyp  06/08/2018   ANTERIOR CERVICAL DISCECTOMY  ?   CARPAL TUNNEL RELEASE Right 10/28/2021   Procedure: ENDOSCOPIC RIGHT CARPAL TUNNEL RELEASE AND SUBCUTANEOUS ANTERIOR TRANSPOSITION OF ULNAR NERVE AT RIGHT ELBOW.;  Surgeon: Corky Mull, MD;  Location: [facility] ORS;  Service: Orthopedics;  Laterality: Right;   COLONOSCOPY WITH PROPOFOL N/A 07/25/2018   Procedure: COLONOSCOPY WITH PROPOFOL;  Surgeon: Manya Silvas, MD;  Location[facility] ENDOSCOPY;  Service: Endoscopy;  Laterality: N/A;   HAMMER TOE SURGERY Right 2007   2nd toe   LUMBAR LAMINECTOMY/DECOMPRESSION MICRODISCECTOMY Bilateral 10/28/2016   Procedure: LUMBAR LAMINECTOMY/DECOMPRESSION MICRODISCECTOMY 3 LEVELS;  Surgeon: Meade Maw, MD;  Location: [facility] ORS;  Service: Neurosurgery;  Laterality: Bilateral;  L3-4, L4-5,L5-S1 Laminectomies and bilateral foraminotomies   PACEMAKER INSERTION  2011   PPM GENERATOR CHANGEOUT N/A 12/17/2020   Procedure: PPM GENERATOR CHANGEOUT;  Surgeon: Isaias Cowman, MD;  Location: [facility];  Service: Cardiovascular;  Laterality: N/A;   RADIOACTIVE SEED IMPLANT N/A 11/07/2018   Procedure: RADIOACTIVE SEED IMPLANT/BRACHYTHERAPY IMPLANT;  Surgeon: Hollice Espy, MD;  Location: [facility] ORS;  Service: Urology;  Laterality: N/A;    Home Medications:  Allergies as of 07/23/2022   No Known Allergies      Medication List        Accurate as of July 22, 2022  4:02 PM. If you have any questions, ask your nurse or doctor.          acetaminophen 500 MG tablet Commonly known as: TYLENOL Take 1,000 mg by mouth every 6 (six) hours  as needed (for pain.).   aspirin EC 81 MG tablet Take 81 mg by mouth daily.   Calcium 600-200 MG-UNIT tablet Take 1 tablet by mouth 2 (two) times daily. Bone Health   chlorpheniramine 4 MG tablet Commonly known as: CHLOR-TRIMETON Take 4 mg by mouth every 4 (four) hours as needed for allergies or rhinitis.   cyanocobalamin 1000 MCG tablet Commonly known as: VITAMIN B12 Take 1,000 mcg by mouth daily.   docusate sodium 100 MG capsule Commonly known as: COLACE Take 2 capsules (200 mg total) by mouth daily.   fexofenadine 180 MG tablet Commonly known as: ALLEGRA Take 180 mg by mouth daily as needed for allergies or rhinitis.   fluocinonide cream 0.05 % Commonly known as: LIDEX Apply 1 application topically 2 (two) times daily as needed (for skin irritation).   gabapentin 300 MG capsule Commonly known as: NEURONTIN Take 600 mg by mouth 3 (three) times daily.   HYDROcodone-acetaminophen 5-325 MG tablet Commonly known as: Norco Take 1-2 tablets by mouth every 6 (six) hours as needed for moderate pain or severe pain. MAXIMUM TOTAL ACETAMINOPHEN DOSE IS 4000 MG PER DAY   meclizine 25 MG tablet Commonly known as: ANTIVERT Take 25 mg by mouth daily as needed for dizziness.   polyethylene glycol 17 g packet Commonly known as: MIRALAX / GLYCOLAX Take 17 g by mouth daily as needed for mild constipation.   potassium chloride SA 20 MEQ tablet Commonly known as: KLOR-CON M Take 20 mEq by mouth daily.   pravastatin 20 MG tablet Commonly known as: PRAVACHOL Take 20 mg by mouth every evening.   solifenacin 5 MG tablet Commonly known as: VESICARE TAKE 1 TABLET EVERY DAY   tadalafil 20 MG tablet Commonly known as: CIALIS Take 1 tablet (20 mg total) by mouth daily as needed for erectile dysfunction.   tamsulosin 0.4 MG Caps capsule Commonly known as: FLOMAX TAKE 1 CAPSULE BY MOUTH ONCE DAILY   triamcinolone 0.025 % cream Commonly known as: KENALOG Apply 1 application  topically daily as needed for dry skin.   triamterene-hydrochlorothiazide 37.5-25 MG tablet Commonly known as: MAXZIDE-25 Take 1 tablet by mouth daily.        Allergies: No Known Allergies  Family History: Family History  Problem Relation Age of Onset   Hypertension Father    Stroke Father    Colon cancer Father    Aortic aneurysm Mother    Prostate cancer Neg Hx    Kidney cancer Neg Hx    Bladder Cancer Neg Hx     Social History:  reports that he quit smoking about 46 years ago. His smoking use included cigarettes. He has never used smokeless tobacco. He reports that he does not drink alcohol and does not use drugs.  ROS: For pertinent review of systems please refer to history of present illness  Physical Exam: There were no  vitals taken for this visit.  Constitutional:  Well nourished. Alert and oriented, No acute distress. HEENT: [place] AT, moist mucus membranes.  Trachea midline Cardiovascular: No clubbing, cyanosis, or edema. Respiratory: Normal respiratory effort, no increased work of breathing. GU: No CVA tenderness.  No bladder fullness or masses.  Patient with circumcised/uncircumcised phallus. ***Foreskin easily retracted***  Urethral meatus is patent.  No penile discharge. No penile lesions or rashes. Scrotum without lesions, cysts, rashes and/or edema.  Testicles are located scrotally bilaterally. No masses are appreciated in the testicles. Left and right epididymis are normal. Rectal: Patient with  normal sphincter tone. Anus and perineum without scarring or rashes. No rectal masses are appreciated. Prostate is approximately *** grams, *** nodules are appreciated. Seminal vesicles are normal. Neurologic: Grossly intact, no focal deficits, moving all 4 extremities. Psychiatric: Normal mood and affect.   Laboratory Data:  Prostate Specific Ag, Serum  Latest Ref Rng 0.0 - 4.0 ng/mL  02/01/2018 5.2 (H)   03/03/2018 5.0 (H)    5.1 (H)   06/13/2018 5.3 (H)   06/26/2019  <0.1   12/19/2019 <0.1   06/25/2020 0.2   10/03/2020 0.3   01/02/2021 0.2   07/10/2022 0.1     Legend: (H) High  WBC (White Blood Cell Count) 4.1 - 10.2 10^3/uL 8.0   RBC (Red Blood Cell Count) 4.69 - 6.13 10^6/uL 4.39 Low    Hemoglobin 14.1 - 18.1 gm/dL 13.4 Low    Hematocrit 40.0 - 52.0 % 41.1   MCV (Mean Corpuscular Volume) 80.0 - 100.0 fl 93.6   MCH (Mean Corpuscular Hemoglobin) 27.0 - 31.2 pg 30.5   MCHC (Mean Corpuscular Hemoglobin Concentration) 32.0 - 36.0 gm/dL 32.6   Platelet Count 150 - 450 10^3/uL 332   RDW-CV (Red Cell Distribution Width) 11.6 - 14.8 % 13.7   MPV (Mean Platelet Volume) 9.4 - 12.4 fl 9.1 Low    Neutrophils 1.50 - 7.80 10^3/uL 5.50   Lymphocytes 1.00 - 3.60 10^3/uL 1.60   Mixed Count 0.10 - 0.90 10^3/uL 0.90   Neutrophil % 32.0 - 70.0 % 68.9   Lymphocyte % 10.0 - 50.0 % 19.9   Mixed % 3.0 - 14.4 % 11.2   Resulting Agency  [facility] - LAB   Specimen Collected: 06/19/22 10:36   Performed by: [facility] - LAB Last Resulted: 06/19/22 16:04  Received From: [place]  Result Received: 07/09/22 08:12   Glucose 70 - 110 mg/dL 97   Sodium 136 - 145 mmol/L 138   Potassium 3.6 - 5.1 mmol/L 4.1   Chloride 97 - 109 mmol/L 99   Carbon Dioxide (CO2) 22.0 - 32.0 mmol/L 29.7   Urea Nitrogen (BUN) 7 - 25 mg/dL 12   Creatinine 0.7 - 1.3 mg/dL 0.9   Glomerular Filtration Rate (eGFR), MDRD Estimate >60 mL/min/1.73sq m 98   Calcium 8.7 - 10.3 mg/dL 9.6   AST  8 - 39 U/L 21   ALT  6 - 57 U/L 16   Alk Phos (alkaline Phosphatase) 34 - 104 U/L 87   Albumin 3.5 - 4.8 g/dL 4.2   Bilirubin, Total 0.3 - 1.2 mg/dL 0.7   Protein, Total 6.1 - 7.9 g/dL 7.1   A/G Ratio 1.0 - 5.0 gm/dL 1.4   Resulting Agency  [place] [facility] - LAB   Specimen Collected: 06/19/22 10:36   Performed by: [facility] - LAB Last Resulted: 06/19/22 16:39  Received From: [facility]  Result Received:  07/09/22 08:12    Hemoglobin A1C 4.2 - 5.6 % 6.6 High    Average Blood Glucose (Calc) mg/dL 143   Resulting [place]  Narrative Performed by [facility] - LAB Normal Range:    4.2 - 5.6%  Increased Risk:  5.7 - 6.4%  Diabetes:        >= 6.5%  Glycemic Control for adults with diabetes:  <7%    Specimen Collected: 06/19/22 10:36   Performed by: [place]: 06/19/22 19:12  Received From: [place]  Result Received: 07/09/22 08:12   Cholesterol, Total 100 - 200 mg/dL 145   Triglyceride 35 - 199 mg/dL 75   HDL (High Density Lipoprotein) Cholesterol 29.0 - 71.0 mg/dL 43.7   LDL Calculated 0 - 130 mg/dL 86   VLDL Cholesterol mg/dL 15   Cholesterol/HDL Ratio  3.3   Resulting Agency  [place] - LAB   Specimen Collected: 06/19/22 10:36   Performed by: [place]: 06/19/22 16:40  Received From: [place]  Result Received: 07/09/22 08:12   Thyroid Stimulating Hormone (TSH) 0.450-5.330 uIU/ml uIU/mL 1.[place]   Specimen Collected: 06/19/22 10:36   Performed by: [place]: 06/19/22 16:10  Received From: [place]  Result Received: 07/09/22 08:12  I have reviewed the labs.  Pertinent Imaging: N/A   Assessment & Plan:    1. Prostate Cancer [facility]) -Gleason 3+4 intermediate risk disease, cT2 - seed implant 10/2018 with 6 months ADT -PSA is 0.17  2. Lower urinary tract symptoms -continue tamsulosin 0.4 mg daily  3. Urgency -continue Gemtesa 75 mg daily  4. Erectile dysfunction of organic origin -continue tadalafil 20 mg, on-demand-dosing  No follow-ups on file.  These notes generated with voice recognition software. I apologize for typographical errors.  Zara Council, PA-C [facility] [address]  [place],  [place] [address] [phone]

## 2022-07-23 ENCOUNTER — Encounter: Payer: Self-pay | Admitting: Urology

## 2022-07-23 ENCOUNTER — Ambulatory Visit: Payer: Medicare PPO | Admitting: Urology

## 2022-07-23 VITALS — BP 129/70 | HR 80 | Ht 63.0 in | Wt 219.0 lb

## 2022-07-23 DIAGNOSIS — N401 Enlarged prostate with lower urinary tract symptoms: Secondary | ICD-10-CM

## 2022-07-23 DIAGNOSIS — C61 Malignant neoplasm of prostate: Secondary | ICD-10-CM

## 2022-07-23 DIAGNOSIS — N529 Male erectile dysfunction, unspecified: Secondary | ICD-10-CM

## 2022-07-23 DIAGNOSIS — R339 Retention of urine, unspecified: Secondary | ICD-10-CM

## 2022-07-23 DIAGNOSIS — R399 Unspecified symptoms and signs involving the genitourinary system: Secondary | ICD-10-CM

## 2022-07-23 MED ORDER — TADALAFIL 5 MG PO TABS: 5.0000 mg | ORAL_TABLET | Freq: Every day | ORAL | 3 refills | Status: DC | PRN

## 2022-07-24 ENCOUNTER — Other Ambulatory Visit: Payer: Self-pay

## 2022-07-24 DIAGNOSIS — C61 Malignant neoplasm of prostate: Secondary | ICD-10-CM

## 2022-09-28 ENCOUNTER — Ambulatory Visit (LOCAL_COMMUNITY_HEALTH_CENTER): Payer: Medicare PPO

## 2022-09-28 DIAGNOSIS — Z23 Encounter for immunization: Secondary | ICD-10-CM

## 2022-09-28 DIAGNOSIS — Z719 Counseling, unspecified: Secondary | ICD-10-CM

## 2022-09-28 NOTE — Progress Notes (Signed)
In nurse clinic requesting covid vaccine.  States did fine with past covid vaccines.  Comirnaty 2023-24 vaccine administered IM left deltoid; tolerated well.  VIS given.  [facility] updated and copy to pt.  Waited 15 min after vaccine given. Tonny Branch, RN  Are you feeling sick today? No   Have you ever received a dose of COVID-19 Vaccine? [employer], [place], [place], [place], Other) Yes  If yes, which vaccine and how many doses?    Pfizer 5 doses  Did you bring the vaccination record card or other documentation?  Yes   Do you have a health condition or are undergoing treatment that makes you moderately or severely immunocompromised? This would include, but not be limited to: cancer, HIV, organ transplant, immunosuppressive therapy/high-dose corticosteroids, or moderate/severe primary immunodeficiency.  No  Have you received COVID-19 vaccine before or during hematopoietic cell transplant (HCT) or CAR-T-cell therapies? No  Have you ever had an allergic reaction to: (This would include a severe allergic reaction or a reaction that caused hives, swelling, or respiratory distress, including wheezing.) A component of a COVID-19 vaccine or a previous dose of COVID-19 vaccine? No   Have you ever had an allergic reaction to another vaccine (other thanCOVID-19 vaccine) or an injectable medication? (This would include a severe allergic reaction or a reaction that caused hives, swelling, or respiratory distress, including wheezing.)   No    Do you have a history of any of the following:  Myocarditis or Pericarditis No  Dermal fillers:  No  Multisystem Inflammatory Syndrome (MIS-C or MIS-A)? No  COVID-19 disease within the past 3 months? No  Vaccinated with monkeypox vaccine in the last 4 weeks? No

## 2022-10-19 ENCOUNTER — Encounter (INDEPENDENT_AMBULATORY_CARE_PROVIDER_SITE_OTHER): Payer: Self-pay

## 2022-12-02 ENCOUNTER — Other Ambulatory Visit: Payer: Self-pay | Admitting: Urology

## 2022-12-02 DIAGNOSIS — R399 Unspecified symptoms and signs involving the genitourinary system: Secondary | ICD-10-CM

## 2023-01-26 ENCOUNTER — Other Ambulatory Visit: Payer: Medicare PPO

## 2023-01-26 DIAGNOSIS — C61 Malignant neoplasm of prostate: Secondary | ICD-10-CM

## 2023-01-27 ENCOUNTER — Telehealth: Payer: Self-pay | Admitting: *Deleted

## 2023-01-27 LAB — PSA: Prostate Specific Ag, Serum: 0.2 ng/mL (ref 0.0–4.0)

## 2023-01-27 NOTE — Telephone Encounter (Signed)
Left message on phone per DPR. Appt made

## 2023-01-27 NOTE — Telephone Encounter (Signed)
-----   Message from Nori Riis, PA-C sent at 01/27/2023  9:01 AM EST ----- Please let Mr. Speiser know that his PSA is stable and I need to see him in 6 months with PSA prior.

## 2023-02-19 DIAGNOSIS — I2510 Atherosclerotic heart disease of native coronary artery without angina pectoris: Secondary | ICD-10-CM | POA: Diagnosis not present

## 2023-02-19 DIAGNOSIS — N3941 Urge incontinence: Secondary | ICD-10-CM | POA: Diagnosis not present

## 2023-02-19 DIAGNOSIS — K5900 Constipation, unspecified: Secondary | ICD-10-CM | POA: Diagnosis not present

## 2023-02-19 DIAGNOSIS — I739 Peripheral vascular disease, unspecified: Secondary | ICD-10-CM | POA: Diagnosis not present

## 2023-02-19 DIAGNOSIS — N3943 Post-void dribbling: Secondary | ICD-10-CM | POA: Diagnosis not present

## 2023-02-19 DIAGNOSIS — E876 Hypokalemia: Secondary | ICD-10-CM | POA: Diagnosis not present

## 2023-02-19 DIAGNOSIS — N529 Male erectile dysfunction, unspecified: Secondary | ICD-10-CM | POA: Diagnosis not present

## 2023-02-19 DIAGNOSIS — M1990 Unspecified osteoarthritis, unspecified site: Secondary | ICD-10-CM | POA: Diagnosis not present

## 2023-02-19 DIAGNOSIS — K219 Gastro-esophageal reflux disease without esophagitis: Secondary | ICD-10-CM | POA: Diagnosis not present

## 2023-02-19 DIAGNOSIS — E785 Hyperlipidemia, unspecified: Secondary | ICD-10-CM | POA: Diagnosis not present

## 2023-02-19 DIAGNOSIS — M792 Neuralgia and neuritis, unspecified: Secondary | ICD-10-CM | POA: Diagnosis not present

## 2023-02-19 DIAGNOSIS — I10 Essential (primary) hypertension: Secondary | ICD-10-CM | POA: Diagnosis not present

## 2023-02-19 DIAGNOSIS — J309 Allergic rhinitis, unspecified: Secondary | ICD-10-CM | POA: Diagnosis not present

## 2023-02-19 DIAGNOSIS — J449 Chronic obstructive pulmonary disease, unspecified: Secondary | ICD-10-CM | POA: Diagnosis not present

## 2023-02-19 DIAGNOSIS — N400 Enlarged prostate without lower urinary tract symptoms: Secondary | ICD-10-CM | POA: Diagnosis not present

## 2023-02-19 DIAGNOSIS — L309 Dermatitis, unspecified: Secondary | ICD-10-CM | POA: Diagnosis not present

## 2023-02-19 DIAGNOSIS — R269 Unspecified abnormalities of gait and mobility: Secondary | ICD-10-CM | POA: Diagnosis not present

## 2023-02-19 DIAGNOSIS — E669 Obesity, unspecified: Secondary | ICD-10-CM | POA: Diagnosis not present

## 2023-03-02 DIAGNOSIS — M19012 Primary osteoarthritis, left shoulder: Secondary | ICD-10-CM | POA: Diagnosis not present

## 2023-03-02 DIAGNOSIS — Z6836 Body mass index (BMI) 36.0-36.9, adult: Secondary | ICD-10-CM | POA: Diagnosis not present

## 2023-03-02 DIAGNOSIS — M19011 Primary osteoarthritis, right shoulder: Secondary | ICD-10-CM | POA: Diagnosis not present

## 2023-03-02 DIAGNOSIS — G8929 Other chronic pain: Secondary | ICD-10-CM | POA: Diagnosis not present

## 2023-03-25 ENCOUNTER — Other Ambulatory Visit: Payer: Self-pay | Admitting: Urology

## 2023-03-25 DIAGNOSIS — R399 Unspecified symptoms and signs involving the genitourinary system: Secondary | ICD-10-CM

## 2023-04-02 ENCOUNTER — Inpatient Hospital Stay: Payer: Medicare PPO | Attending: Radiation Oncology

## 2023-04-02 DIAGNOSIS — C61 Malignant neoplasm of prostate: Secondary | ICD-10-CM | POA: Diagnosis not present

## 2023-04-02 LAB — PSA: Prostatic Specific Antigen: 0.1 ng/mL (ref 0.00–4.00)

## 2023-04-06 DIAGNOSIS — I495 Sick sinus syndrome: Secondary | ICD-10-CM | POA: Diagnosis not present

## 2023-04-06 DIAGNOSIS — G4733 Obstructive sleep apnea (adult) (pediatric): Secondary | ICD-10-CM | POA: Diagnosis not present

## 2023-04-06 DIAGNOSIS — E7800 Pure hypercholesterolemia, unspecified: Secondary | ICD-10-CM | POA: Diagnosis not present

## 2023-04-06 DIAGNOSIS — I700 Atherosclerosis of aorta: Secondary | ICD-10-CM | POA: Diagnosis not present

## 2023-04-06 DIAGNOSIS — I10 Essential (primary) hypertension: Secondary | ICD-10-CM | POA: Diagnosis not present

## 2023-04-09 ENCOUNTER — Ambulatory Visit: Payer: Medicare PPO | Admitting: Radiation Oncology

## 2023-04-11 DIAGNOSIS — G4733 Obstructive sleep apnea (adult) (pediatric): Secondary | ICD-10-CM | POA: Diagnosis not present

## 2023-04-12 ENCOUNTER — Ambulatory Visit
Admission: RE | Admit: 2023-04-12 | Discharge: 2023-04-12 | Disposition: A | Discharge status: Home / Self Care | Payer: Medicare PPO | Source: Ambulatory Visit | Attending: Radiation Oncology | Admitting: Radiation Oncology

## 2023-04-12 ENCOUNTER — Encounter: Payer: Self-pay | Admitting: Radiation Oncology

## 2023-04-12 VITALS — Resp 16 | Ht 63.0 in | Wt 206.0 lb

## 2023-04-12 DIAGNOSIS — Z923 Personal history of irradiation: Secondary | ICD-10-CM | POA: Insufficient documentation

## 2023-04-12 DIAGNOSIS — C61 Malignant neoplasm of prostate: Secondary | ICD-10-CM

## 2023-04-12 DIAGNOSIS — Z191 Hormone sensitive malignancy status: Secondary | ICD-10-CM | POA: Diagnosis not present

## 2023-04-12 NOTE — Progress Notes (Signed)
Radiation Oncology Follow up Note  Name: Richard Alexander   Date:   04/12/2023 MRN:  [id number] DOB: November 28, 1941    This 82 y.o. male presents to the clinic today for 4 and half year follow-up status post I-125 interstitial implant for Gleason 7 adenocarcinoma the prostate.  REFERRING PROVIDER: Marina Goodell, MD  HPI: Patient is a 82 year old male now out close to 5 years having completed I-125 interstitial implant for Gleason 7 adenocarcinoma the prostate seen today in routine follow-up he is doing well specifically denies any increased lower urinary tract symptoms diarrhea or fatigue.  His most recent PSA is 0.1 down from 0.19 a year ago  COMPLICATIONS OF TREATMENT: none  FOLLOW UP COMPLIANCE: keeps appointments   PHYSICAL EXAM:  Resp 16   Ht  (1.6 m)   Wt 206 lb (93.4 kg)   BMI 36.49 kg/m  Well-developed well-nourished patient in NAD. HEENT reveals PERLA, EOMI, discs not visualized.  Oral cavity is clear. No oral mucosal lesions are identified. Neck is clear without evidence of cervical or supraclavicular adenopathy. Lungs are clear to A&P. Cardiac examination is essentially unremarkable with regular rate and rhythm without murmur rub or thrill. Abdomen is benign with no organomegaly or masses noted. Motor sensory and DTR levels are equal and symmetric in the upper and lower extremities. Cranial nerves II through XII are grossly intact. Proprioception is intact. No peripheral adenopathy or edema is identified. No motor or sensory levels are noted. Crude visual fields are within normal range.  RADIOLOGY RESULTS: No current films for review  PLAN: Present time patient is now close to 5 years under excellent biochemical control of his prostate cancer.  I am pleased with his overall progress.  I am going to discontinue follow-up care he continues seeing urology on a regular basis.  Be happy to reevaluate the patient in the future should that be indicated.  I would like to take this  opportunity to thank you for allowing me to participate in the care of your patient.Carmina Miller, MD

## 2023-04-13 ENCOUNTER — Other Ambulatory Visit: Payer: Self-pay | Admitting: Urology

## 2023-04-14 DIAGNOSIS — I495 Sick sinus syndrome: Secondary | ICD-10-CM | POA: Diagnosis not present

## 2023-04-14 DIAGNOSIS — I700 Atherosclerosis of aorta: Secondary | ICD-10-CM | POA: Diagnosis not present

## 2023-06-29 DIAGNOSIS — M19011 Primary osteoarthritis, right shoulder: Secondary | ICD-10-CM | POA: Diagnosis not present

## 2023-06-29 DIAGNOSIS — Z6836 Body mass index (BMI) 36.0-36.9, adult: Secondary | ICD-10-CM | POA: Diagnosis not present

## 2023-06-29 DIAGNOSIS — M19012 Primary osteoarthritis, left shoulder: Secondary | ICD-10-CM | POA: Diagnosis not present

## 2023-07-07 DIAGNOSIS — D649 Anemia, unspecified: Secondary | ICD-10-CM | POA: Diagnosis not present

## 2023-07-07 DIAGNOSIS — I119 Hypertensive heart disease without heart failure: Secondary | ICD-10-CM | POA: Diagnosis not present

## 2023-07-07 DIAGNOSIS — I10 Essential (primary) hypertension: Secondary | ICD-10-CM | POA: Diagnosis not present

## 2023-07-07 DIAGNOSIS — G4733 Obstructive sleep apnea (adult) (pediatric): Secondary | ICD-10-CM | POA: Diagnosis not present

## 2023-07-07 DIAGNOSIS — R7303 Prediabetes: Secondary | ICD-10-CM | POA: Diagnosis not present

## 2023-07-07 DIAGNOSIS — I700 Atherosclerosis of aorta: Secondary | ICD-10-CM | POA: Diagnosis not present

## 2023-07-07 DIAGNOSIS — J309 Allergic rhinitis, unspecified: Secondary | ICD-10-CM | POA: Diagnosis not present

## 2023-07-07 DIAGNOSIS — E669 Obesity, unspecified: Secondary | ICD-10-CM | POA: Diagnosis not present

## 2023-07-07 DIAGNOSIS — E7800 Pure hypercholesterolemia, unspecified: Secondary | ICD-10-CM | POA: Diagnosis not present

## 2023-07-07 DIAGNOSIS — Z0000 Encounter for general adult medical examination without abnormal findings: Secondary | ICD-10-CM | POA: Diagnosis not present

## 2023-07-07 DIAGNOSIS — Z8546 Personal history of malignant neoplasm of prostate: Secondary | ICD-10-CM | POA: Diagnosis not present

## 2023-07-07 DIAGNOSIS — Z1331 Encounter for screening for depression: Secondary | ICD-10-CM | POA: Diagnosis not present

## 2023-07-07 DIAGNOSIS — I495 Sick sinus syndrome: Secondary | ICD-10-CM | POA: Diagnosis not present

## 2023-07-10 DIAGNOSIS — G4733 Obstructive sleep apnea (adult) (pediatric): Secondary | ICD-10-CM | POA: Diagnosis not present

## 2023-07-26 ENCOUNTER — Other Ambulatory Visit: Payer: Self-pay

## 2023-07-26 ENCOUNTER — Other Ambulatory Visit: Payer: Medicare PPO

## 2023-07-26 DIAGNOSIS — C61 Malignant neoplasm of prostate: Secondary | ICD-10-CM | POA: Diagnosis not present

## 2023-07-27 NOTE — Progress Notes (Unsigned)
07/28/2023 7:33 AM   Richard Alexander 28, 1942 [id number]  Referring provider: Marina Goodell, MD [address] [place],  [place] [address]  Urological history: 1. Prostate cancer -PSA (07/2023) <0.1  -prostate biopsy on 08/12/2018 for an elevated PSA rising on finasteride, 5.3 at the time of biopsy (10.6 adjusted).  He was also noted to have induration on the right side of his prostate gland on rectal exam.  Transrectal ultrasound volume 26 g.  Prostate biopsy showed 7/12 cores positive of Gleason 3+4 and 3+3 prostate cancer.  His primary tumor was on the right with 3 cores of Gleason 3+4 involving up to 86% of the tissue right apex and mid gland.  The remainder of the prostate showed low volume Gleason 3+3 dispersed throughout the prostate -Patient had 1-125 prostate seeds implanted on 11/07/2018 -ADT completed 10/2019 -Released from the cancer center care in April 2024   2. BPH with LU TS - tamsulosin 0.4 mg daily and solifenacin 5 mg daily    3. ED - Contributing factors of age, prostate cancer, pelvic radiation, hypertension, CAD, OSA, degenerative disc disease in the lumbar area, hyperlipidemia and history of smoking - taking tadalafil 20 mg on demand dosing   No chief complaint on file.  HPI: Richard Alexander is a 82 y.o. male who presents today for one year follow up.    Previous records reviewed.   Has heart pacer.  Has OSA and is compliant with CPAP machine.  I PSS ***    Score:  1-7 Mild 8-19 Moderate 20-35 Severe   SHIM ***    Score: 1-7 Severe ED 8-11 Moderate ED 12-16 Mild-Moderate ED 17-21 Mild ED 22-25 No ED   PMH: Past Medical History:  Diagnosis Date   Adenomatous polyp    Allergic rhinitis    Aortic atherosclerosis (HCC)    Arthritis    BPH (benign prostatic hyperplasia)    DDD (degenerative disc disease), cervical    Diuretic-induced hypokalemia    Erectile dysfunction    Esophageal diverticulum    HLD (hyperlipidemia)     Hypertension    Insomnia    LBBB (left bundle branch block)    a.) noted on MPI performed 09/17/2016   Lumbar spinal stenosis    OSA on CPAP    Photoallergic dermatitis    Presence of permanent cardiac pacemaker    a.) Medtronic device   Prostate cancer (HCC)    a.) 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   CARPAL TUNNEL RELEASE Right 10/28/2021   Procedure: ENDOSCOPIC RIGHT CARPAL TUNNEL RELEASE AND SUBCUTANEOUS ANTERIOR TRANSPOSITION OF ULNAR NERVE AT RIGHT ELBOW.;  Surgeon: Christena Flake, MD;  Location: [facility] ORS;  Service: Orthopedics;  Laterality: Right;   COLONOSCOPY WITH PROPOFOL N/A 07/25/2018   Procedure: COLONOSCOPY WITH PROPOFOL;  Surgeon: Scot Jun, MD;  Location: [facility] ENDOSCOPY;  Service: Endoscopy;  Laterality: N/A;   HAMMER TOE SURGERY Right 2007   2nd toe   LUMBAR LAMINECTOMY/DECOMPRESSION MICRODISCECTOMY Bilateral 10/28/2016   Procedure: LUMBAR LAMINECTOMY/DECOMPRESSION MICRODISCECTOMY 3 LEVELS;  Surgeon: Venetia Night, MD;  Location: [facility] ORS;  Service: Neurosurgery;  Laterality: Bilateral;  L3-4, L4-5,L5-S1 Laminectomies and bilateral foraminotomies   PACEMAKER INSERTION  2011   PPM GENERATOR CHANGEOUT  N/A 12/17/2020   Procedure: PPM GENERATOR CHANGEOUT;  Surgeon: Marcina Millard, MD;  Location: [facility] INVASIVE CV LAB;  Service: Cardiovascular;  Laterality: N/A;   RADIOACTIVE SEED IMPLANT N/A 11/07/2018   Procedure: RADIOACTIVE SEED IMPLANT/BRACHYTHERAPY IMPLANT;  Surgeon: Vanna Scotland, MD;  Location: [facility] ORS;  Service: Urology;  Laterality: N/A;    Home Medications:  Allergies as of 07/28/2023   No Known Allergies      Medication List        Accurate as of July 27, 2023  7:33 AM. If you have any questions,  ask your nurse or doctor.          acetaminophen 500 MG tablet Commonly known as: TYLENOL Take 1,000 mg by mouth every 6 (six) hours as needed (for pain.).   aspirin EC 81 MG tablet Take 81 mg by mouth daily.   Calcium 600-200 MG-UNIT tablet Take 1 tablet by mouth 2 (two) times daily. Bone Health   chlorpheniramine 4 MG tablet Commonly known as: CHLOR-TRIMETON Take 4 mg by mouth every 4 (four) hours as needed for allergies or rhinitis.   cyanocobalamin 1000 MCG tablet Commonly known as: VITAMIN B12 Take 1,000 mcg by mouth daily.   docusate sodium 100 MG capsule Commonly known as: COLACE Take 2 capsules (200 mg total) by mouth daily.   fexofenadine 180 MG tablet Commonly known as: ALLEGRA Take 180 mg by mouth daily as needed for allergies or rhinitis.   fluocinonide cream 0.05 % Commonly known as: LIDEX Apply 1 application topically 2 (two) times daily as needed (for skin irritation).   gabapentin 300 MG capsule Commonly known as: NEURONTIN Take 600 mg by mouth 3 (three) times daily.   meclizine 25 MG tablet Commonly known as: ANTIVERT Take 25 mg by mouth daily as needed for dizziness.   polyethylene glycol 17 g packet Commonly known as: MIRALAX / GLYCOLAX Take 17 g by mouth daily as needed for mild constipation.   potassium chloride SA 20 MEQ tablet Commonly known as: KLOR-CON M Take 20 mEq by mouth daily.   pravastatin 20 MG tablet Commonly known as: PRAVACHOL Take 20 mg by mouth every evening.   solifenacin 5 MG tablet Commonly known as: VESICARE TAKE 1 TABLET EVERY DAY   tadalafil 20 MG tablet Commonly known as: CIALIS Take 1 tablet (20 mg total) by mouth daily as needed for erectile dysfunction.   tadalafil 5 MG tablet Commonly known as: CIALIS TAKE 1 TABLET BY MOUTH ONCE DAILY AS NEEDED FOR ERECTILE DYSFUNCTION   tamsulosin 0.4 MG Caps capsule Commonly known as: FLOMAX TAKE 1 CAPSULE BY MOUTH ONCE DAILY   triamcinolone 0.025 %  cream Commonly known as: KENALOG Apply 1 application topically daily as needed for dry skin.   triamterene-hydrochlorothiazide 37.5-25 MG tablet Commonly known as: MAXZIDE-25 Take 1 tablet by mouth daily.        Allergies: No Known Allergies  Family History: Family History  Problem Relation Age of Onset   Hypertension Father    Stroke Father    Colon cancer Father    Aortic aneurysm Mother    Prostate cancer Neg Hx    Kidney cancer Neg Hx    Bladder Cancer Neg Hx     Social History:  reports that he quit smoking about 47 years ago. His smoking use included cigarettes. He has never used smokeless tobacco. He reports that he does not drink alcohol and does not use drugs.  ROS: Pertinent ROS in HPI  Physical Exam: There were  no vitals taken for this visit.  Constitutional:  Well nourished. Alert and oriented, No acute distress. HEENT: [place] AT, moist mucus membranes.  Trachea midline. Cardiovascular: No clubbing, cyanosis, or edema. Respiratory: Normal respiratory effort, no increased work of breathing. Neurologic: Grossly intact, no focal deficits, moving all 4 extremities. Psychiatric: Normal mood and affect.  Laboratory Data: Comprehensive Metabolic Panel (CMP) Order: [id number] Component Ref Range & Units 2 wk ago  Glucose 70 - 110 mg/dL 98  Sodium 086 - 578 mmol/L 138  Potassium 3.6 - 5.1 mmol/L 4.5  Chloride 97 - 109 mmol/L 101  Carbon Dioxide (CO2) 22.0 - 32.0 mmol/L 29.0  Urea Nitrogen (BUN) 7 - 25 mg/dL 23  Creatinine 0.7 - 1.3 mg/dL 1.1  Glomerular Filtration Rate (eGFR) >60 mL/min/1.73sq m 67  Comment: CKD-EPI (2021) does not include patient's race in the calculation of eGFR.  Monitoring changes of plasma creatinine and eGFR over time is useful for monitoring kidney function.  Interpretive Ranges for eGFR (CKD-EPI 2021):  eGFR:       >60 mL/min/1.73 sq. m - Normal eGFR:       30-59 mL/min/1.73 sq. m - Moderately Decreased eGFR:       15-29  mL/min/1.73 sq. m  - Severely Decreased eGFR:       < 15 mL/min/1.73 sq. m  - Kidney Failure   Note: These eGFR calculations do not apply in acute situations when eGFR is changing rapidly or patients on dialysis.  Calcium 8.7 - 10.3 mg/dL 46.9  AST 8 - 39 U/L 17  ALT 6 - 57 U/L 19  Alk Phos (alkaline Phosphatase) 34 - 104 U/L 78  Albumin 3.5 - 4.8 g/dL 4.2  Bilirubin, Total 0.3 - 1.2 mg/dL 0.8  Protein, Total 6.1 - 7.9 g/dL 7.3  A/G Ratio 1.0 - 5.0 gm/dL 1.4  Resulting Agency [facility]   Specimen Collected: 07/07/23 09:52   Performed by: [facility] Last Resulted: 07/07/23 16:36  Received From: [facility]  Result Received: 07/26/23 09:15   Hemoglobin A1C Order: [id number] Component Ref Range & Units 2 wk ago  Hemoglobin A1C 4.2 - 5.6 % 6.6 High   Average Blood Glucose (Calc) mg/dL 244  Resulting Agency [facility]  Narrative Performed by [facility] Normal Range:    4.2 - 5.6% Increased Risk:  5.7 - 6.4% Diabetes:        >= 6.5% Glycemic Control for adults with diabetes:  <7%    Specimen Collected: 07/07/23 09:52   Performed by: [facility] Last Resulted: 07/07/23 16:45  Received From: [facility]  Result Received: 07/26/23 09:15   Lipid Panel w/calc LDL Order: [id number] Component Ref Range & Units 2 wk ago  Cholesterol, Total 100 - 200 mg/dL 644  Triglyceride 35 - 199 mg/dL 53  HDL (High Density Lipoprotein) Cholesterol 29.0 - 71.0 mg/dL 03.4  LDL Calculated 0 - 130 mg/dL 742  VLDL Cholesterol mg/dL 11  Cholesterol/HDL Ratio 2.8  Resulting Agency [facility]   Specimen Collected: 07/07/23 09:52   Performed by: [facility] Last Resulted: 07/07/23 16:44  [facility] From: [facility]  Result Received: 07/26/23 09:15   Thyroid Stimulating-Hormone (TSH) Order: [id number] Component Ref Range &  Units 2 wk ago  Thyroid Stimulating Hormone (TSH) 0.450-5.330 uIU/ml uIU/mL 0.895  Resulting Agency [facility] - [facility]   Specimen  Collected: 07/07/23 09:52   Performed by: [facility] Last Resulted: 07/07/23 16:47  Received From: [facility]  Result Received: 07/26/23 09:15   Component     Latest Ref Rng 07/26/2023  Prostate Specific Ag, Serum     0.0 - 4.0 ng/mL <0.1   I have reviewed the labs.   Pertinent Imaging: N/A  Assessment & Plan:    1. Prostate cancer -PSA remains undetectable -He is now 5 years out -Will move to checking PSA annually  2. BPH with LUTS -PVR < 300 cc *** -symptoms - *** -most bothersome symptoms are *** -continue conservative management, avoiding bladder irritants and timed voiding's -Continue tamsulosin 0.4 mg daily and Vesicare 5 mg daily   3. Erectile dysfunction - I explained to the patient that in order to achieve an erection it takes good functioning of the nervous system (parasympathetic and rs, sympathetic, sensory and motor), good blood flow into the erectile tissue of the penis and a desire to have sex - I explained that conditions like diabetes, hypertension, coronary artery disease, peripheral vascular disease, smoking, alcohol consumption, age, sleep apnea and BPH can diminish the ability to have an erection - I explained the ED may be a risk marker for underlying CVD and he should follow up with PCP for further studies *** - we will obtain a serum testosterone level at this time; if it is abnormal we will need to repeat the study for confirmation *** - A recent study published in Sex Med 2018 Apr 13 revealed moderate to vigorous aerobic exercise for 40 minutes 4 times per week can decrease erectile problems caused by physical inactivity, obesity, hypertension, metabolic syndrome and/or cardiovascular diseases *** - We discussed trying a *** different PDE5 inhibitor, intra-urethral suppositories,  intracavernous vasoactive drug injection therapy, vacuum erection devices, LI-ESWT and penile prosthesis implantation    No follow-ups on file.  These notes generated with voice recognition software. I apologize for typographical errors.  Cloretta Ned  [facility] [address]  [address] [place], [place] [address] [phone]

## 2023-07-28 ENCOUNTER — Encounter: Payer: Self-pay | Admitting: Urology

## 2023-07-28 ENCOUNTER — Ambulatory Visit: Payer: Medicare PPO | Admitting: Urology

## 2023-07-28 VITALS — BP 118/69 | HR 81 | Ht 63.0 in | Wt 206.0 lb

## 2023-07-28 DIAGNOSIS — R399 Unspecified symptoms and signs involving the genitourinary system: Secondary | ICD-10-CM

## 2023-07-28 DIAGNOSIS — C61 Malignant neoplasm of prostate: Secondary | ICD-10-CM

## 2023-07-28 DIAGNOSIS — N529 Male erectile dysfunction, unspecified: Secondary | ICD-10-CM | POA: Diagnosis not present

## 2023-07-28 DIAGNOSIS — N401 Enlarged prostate with lower urinary tract symptoms: Secondary | ICD-10-CM | POA: Diagnosis not present

## 2023-07-28 DIAGNOSIS — Z8546 Personal history of malignant neoplasm of prostate: Secondary | ICD-10-CM

## 2023-07-28 MED ORDER — SILDENAFIL CITRATE 100 MG PO TABS: 100.0000 mg | ORAL_TABLET | Freq: Every day | ORAL | 3 refills | Status: DC | PRN

## 2023-07-28 NOTE — Addendum Note (Signed)
Addended by: Judd Gaudier on: 07/28/2023 10:02 AM   Modules accepted: Orders

## 2023-09-07 ENCOUNTER — Ambulatory Visit: Payer: Medicare PPO

## 2023-09-07 DIAGNOSIS — Z23 Encounter for immunization: Secondary | ICD-10-CM | POA: Diagnosis not present

## 2023-09-07 DIAGNOSIS — Z719 Counseling, unspecified: Secondary | ICD-10-CM

## 2023-09-07 NOTE — Progress Notes (Signed)
Pt seen in clinic with sister in law. Pt requested Pfizer and high Flu dose. Eligible, administered High dose Fluzone and Pfizer comiranty 12y+, yr 2024-2025. Monitored for 15 min without problems. Given VIS and NCIR copies, explained and understood. M.Nathanel Tallman, LP.

## 2023-09-09 DIAGNOSIS — H26493 Other secondary cataract, bilateral: Secondary | ICD-10-CM | POA: Diagnosis not present

## 2023-09-09 DIAGNOSIS — I10 Essential (primary) hypertension: Secondary | ICD-10-CM | POA: Diagnosis not present

## 2023-09-09 DIAGNOSIS — H40022 Open angle with borderline findings, high risk, left eye: Secondary | ICD-10-CM | POA: Diagnosis not present

## 2023-09-13 DIAGNOSIS — G8929 Other chronic pain: Secondary | ICD-10-CM | POA: Diagnosis not present

## 2023-09-13 DIAGNOSIS — M5416 Radiculopathy, lumbar region: Secondary | ICD-10-CM | POA: Diagnosis not present

## 2023-09-13 DIAGNOSIS — M25511 Pain in right shoulder: Secondary | ICD-10-CM | POA: Diagnosis not present

## 2023-09-13 DIAGNOSIS — M5442 Lumbago with sciatica, left side: Secondary | ICD-10-CM | POA: Diagnosis not present

## 2023-09-13 DIAGNOSIS — M5441 Lumbago with sciatica, right side: Secondary | ICD-10-CM | POA: Diagnosis not present

## 2023-09-24 DIAGNOSIS — M5416 Radiculopathy, lumbar region: Secondary | ICD-10-CM | POA: Diagnosis not present

## 2023-09-24 DIAGNOSIS — E119 Type 2 diabetes mellitus without complications: Secondary | ICD-10-CM | POA: Diagnosis not present

## 2023-10-11 DIAGNOSIS — G8929 Other chronic pain: Secondary | ICD-10-CM | POA: Diagnosis not present

## 2023-10-11 DIAGNOSIS — G4733 Obstructive sleep apnea (adult) (pediatric): Secondary | ICD-10-CM | POA: Diagnosis not present

## 2023-10-11 DIAGNOSIS — M5416 Radiculopathy, lumbar region: Secondary | ICD-10-CM | POA: Diagnosis not present

## 2023-10-11 DIAGNOSIS — M5441 Lumbago with sciatica, right side: Secondary | ICD-10-CM | POA: Diagnosis not present

## 2023-10-11 DIAGNOSIS — R2681 Unsteadiness on feet: Secondary | ICD-10-CM | POA: Diagnosis not present

## 2023-10-11 DIAGNOSIS — M5442 Lumbago with sciatica, left side: Secondary | ICD-10-CM | POA: Diagnosis not present

## 2023-10-19 DIAGNOSIS — E7800 Pure hypercholesterolemia, unspecified: Secondary | ICD-10-CM | POA: Diagnosis not present

## 2023-10-19 DIAGNOSIS — I10 Essential (primary) hypertension: Secondary | ICD-10-CM | POA: Diagnosis not present

## 2023-10-19 DIAGNOSIS — I495 Sick sinus syndrome: Secondary | ICD-10-CM | POA: Diagnosis not present

## 2023-10-19 DIAGNOSIS — G4733 Obstructive sleep apnea (adult) (pediatric): Secondary | ICD-10-CM | POA: Diagnosis not present

## 2023-11-11 DIAGNOSIS — H40022 Open angle with borderline findings, high risk, left eye: Secondary | ICD-10-CM | POA: Diagnosis not present

## 2023-11-11 DIAGNOSIS — H26493 Other secondary cataract, bilateral: Secondary | ICD-10-CM | POA: Diagnosis not present

## 2023-11-11 DIAGNOSIS — I10 Essential (primary) hypertension: Secondary | ICD-10-CM | POA: Diagnosis not present

## 2023-11-15 DIAGNOSIS — M79675 Pain in left toe(s): Secondary | ICD-10-CM | POA: Diagnosis not present

## 2023-11-15 DIAGNOSIS — M79674 Pain in right toe(s): Secondary | ICD-10-CM | POA: Diagnosis not present

## 2023-11-15 DIAGNOSIS — B351 Tinea unguium: Secondary | ICD-10-CM | POA: Diagnosis not present

## 2023-12-30 DIAGNOSIS — L564 Polymorphous light eruption: Secondary | ICD-10-CM | POA: Diagnosis not present

## 2024-01-10 DIAGNOSIS — N3281 Overactive bladder: Secondary | ICD-10-CM | POA: Diagnosis not present

## 2024-01-10 DIAGNOSIS — Z23 Encounter for immunization: Secondary | ICD-10-CM | POA: Diagnosis not present

## 2024-01-10 DIAGNOSIS — K219 Gastro-esophageal reflux disease without esophagitis: Secondary | ICD-10-CM | POA: Diagnosis not present

## 2024-01-10 DIAGNOSIS — G4733 Obstructive sleep apnea (adult) (pediatric): Secondary | ICD-10-CM | POA: Diagnosis not present

## 2024-01-10 DIAGNOSIS — I10 Essential (primary) hypertension: Secondary | ICD-10-CM | POA: Diagnosis not present

## 2024-01-10 DIAGNOSIS — E119 Type 2 diabetes mellitus without complications: Secondary | ICD-10-CM | POA: Diagnosis not present

## 2024-01-10 DIAGNOSIS — Z09 Encounter for follow-up examination after completed treatment for conditions other than malignant neoplasm: Secondary | ICD-10-CM | POA: Diagnosis not present

## 2024-01-10 DIAGNOSIS — J309 Allergic rhinitis, unspecified: Secondary | ICD-10-CM | POA: Diagnosis not present

## 2024-01-10 DIAGNOSIS — E7800 Pure hypercholesterolemia, unspecified: Secondary | ICD-10-CM | POA: Diagnosis not present

## 2024-01-11 ENCOUNTER — Ambulatory Visit: Payer: Medicare PPO | Admitting: Physical Therapy

## 2024-01-11 DIAGNOSIS — G4733 Obstructive sleep apnea (adult) (pediatric): Secondary | ICD-10-CM | POA: Diagnosis not present

## 2024-01-13 ENCOUNTER — Ambulatory Visit: Payer: Medicare PPO | Attending: Physical Medicine & Rehabilitation

## 2024-01-13 DIAGNOSIS — M6289 Other specified disorders of muscle: Secondary | ICD-10-CM | POA: Insufficient documentation

## 2024-01-13 DIAGNOSIS — M6281 Muscle weakness (generalized): Secondary | ICD-10-CM | POA: Insufficient documentation

## 2024-01-13 DIAGNOSIS — R262 Difficulty in walking, not elsewhere classified: Secondary | ICD-10-CM | POA: Insufficient documentation

## 2024-01-13 DIAGNOSIS — R2681 Unsteadiness on feet: Secondary | ICD-10-CM | POA: Diagnosis not present

## 2024-01-13 NOTE — Therapy (Signed)
OUTPATIENT PHYSICAL THERAPY THORACOLUMBAR EVALUATION   Patient Name: Richard Alexander MRN: [id number] DOB:1941-10-24, 83 y.o., male Today's Date: 01/13/2024  END OF SESSION:  PT End of Session - 01/13/24 1308     Visit Number 1    Number of Visits 10    Date for PT Re-Evaluation 03/24/24    PT Start Time 1115    PT Stop Time 1200    PT Time Calculation (min) 45 min    Activity Tolerance Patient limited by fatigue    Behavior During Therapy [facility] for tasks assessed/performed             Past Medical History:  Diagnosis Date   Adenomatous polyp    Allergic rhinitis    Aortic atherosclerosis (HCC)    Arthritis    BPH (benign prostatic hyperplasia)    DDD (degenerative disc disease), cervical    Diuretic-induced hypokalemia    Erectile dysfunction    Esophageal diverticulum    HLD (hyperlipidemia)    Hypertension    Insomnia    LBBB (left bundle branch block)    a.) noted on MPI performed 09/17/2016   Lumbar spinal stenosis    OSA on CPAP    Photoallergic dermatitis    Presence of permanent cardiac pacemaker    a.) Medtronic device   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   CARPAL TUNNEL RELEASE Right 10/28/2021   Procedure: ENDOSCOPIC RIGHT CARPAL TUNNEL RELEASE AND SUBCUTANEOUS ANTERIOR TRANSPOSITION OF ULNAR NERVE AT RIGHT ELBOW.;  Surgeon: Christena Flake, MD;  Location: [facility];  Service: Orthopedics;  Laterality: Right;   COLONOSCOPY WITH PROPOFOL N/A 07/25/2018   Procedure: COLONOSCOPY WITH PROPOFOL;  Surgeon: Scot Jun, MD;  Location: [facility] ENDOSCOPY;  Service: Endoscopy;  Laterality: N/A;   HAMMER TOE SURGERY Right 2007   2nd toe   LUMBAR LAMINECTOMY/DECOMPRESSION MICRODISCECTOMY Bilateral 10/28/2016    Procedure: LUMBAR LAMINECTOMY/DECOMPRESSION MICRODISCECTOMY 3 LEVELS;  Surgeon: Venetia Night, MD;  Location: [facility] ORS;  Service: Neurosurgery;  Laterality: Bilateral;  L3-4, L4-5,L5-S1 Laminectomies and bilateral foraminotomies   PACEMAKER INSERTION  2011   PPM GENERATOR CHANGEOUT N/A 12/17/2020   Procedure: PPM GENERATOR CHANGEOUT;  Surgeon: Marcina Millard, MD;  Location: [facility] INVASIVE CV LAB;  Service: Cardiovascular;  Laterality: N/A;   RADIOACTIVE SEED IMPLANT N/A 11/07/2018   Procedure: RADIOACTIVE SEED IMPLANT/BRACHYTHERAPY IMPLANT;  Surgeon: Vanna Scotland, MD;  Location: [facility] ORS;  Service: Urology;  Laterality: N/A;   Patient Active Problem List   Diagnosis Date Noted   Hyperlipidemia 05/28/2020   Claudication (HCC) 05/28/2020   Primary osteoarthritis of right shoulder 11/01/2018   Prostate cancer (HCC) 09/30/2018   Obesity (BMI 35.0-39.9 without comorbidity) 06/21/2017   BPH with elevated PSA 11/23/2016   Lumbar stenosis with neurogenic claudication 10/28/2016   Allergic rhinitis 10/20/2016   Arthritis 10/20/2016   Erectile dysfunction 10/20/2016   Photoallergic dermatitis 10/20/2016   Rotator cuff syndrome of left shoulder 10/20/2016   Left leg numbness 04/09/2016   Lumbosacral radiculopathy at S1 01/23/2016   High risk medication use 09/25/2015   Anemia due to medication 09/17/2014   History of hypokalemia 09/17/2014   Mild anemia 09/17/2014   Diuretic-induced hypokalemia 09/17/2014   DDD (degenerative disc disease), cervical 09/16/2014  History of adenomatous polyp of colon 09/16/2014   Insomnia 09/16/2014   OSA on CPAP 09/16/2014   Benign essential hypertension 04/18/2014   Sinoatrial node dysfunction (HCC) 04/18/2014   Pure hypercholesterolemia 04/18/2014    PCP: Maudie Flakes MD  REFERRING PROVIDER: Filomena Jungling MD  REFERRING DIAG: Balance problems  Rationale for Evaluation and Treatment: Rehabilitation  THERAPY DIAG:  Unsteady on feet,  Fall risk  ONSET DATE: since 2020  SUBJECTIVE:                                                                                                                                                                                           SUBJECTIVE STATEMENT: Pt reported: " I am here because my leg don't have energy. I am strong but when I get up I struggle for the first few steps and then it imporves but my leg gets tired with ~100 ft of walking. I have to sit down some where otherwise I can't remain standing. When I stand for 10 mins or so my low back gets tired and I have to sit down to take rest. When I go to church, I don't go anywhere to stand without any support and I don't sit without support. With support I can stand longer and safely. I furniture walk intermittently at home. I have had 4 to 5 Back injections for pain which has helped. I have to be careful enough to lift my legs so that I don't tripp."  PERTINENT HISTORY:  Patient is a pleasant 83 y.o. male seen in follow-up for the evaluation of low back and bilateral leg pain as well as right shoulder pain. He is status post bilateral S1 TFESI 09/24/2023 with 100% improvement. He denies any problems or complications postinjection. He rates his pain as a 0/10. It is intermittent, aching. He continues to note weakness in his legs but denies any numbness, tingling or loss of control of bowel or bladder. Pain is chronic. Overall he is happy the results from the prior lumbar epidural steroid injections. He continues to take gabapentin 600 mg 3 times a day Patient is  now out close to 5 years having completed I-125 interstitial implant for Gleason 7 adenocarcinoma the prostate seen today in routine follow-up he is doing well specifically denies any increased lower urinary tract symptoms diarrhea or fatigue. ural injections in his cervical spine in 2022. He is status post L3-S1 decompression in 2018 with Dr. Myer Haff. He also has a prior C3-4 ACDF.     PAIN:  Are you having pain? No  PRECAUTIONS: Fall and ICD/Pacemaker  RED FLAGS: None   WEIGHT BEARING RESTRICTIONS:  No  FALLS:  Has patient fallen in last 6 months? No  LIVING ENVIRONMENT: Lives with: lives alone Lives in: House/apartment Stairs: Yes: External: 2 steps; on left going up Has following equipment at home: Single point cane  OCCUPATION: Retired  PLOF: Independent  PATIENT GOALS: " I want to be able to walk at least 20 mins without falling and getting tired."  NEXT MD VISIT: None at this point  OBJECTIVE:  Note: Objective measures were completed at Evaluation unless otherwise noted.    DIAGNOSTIC FINDINGS: See chart   PATIENT SURVEYS:  ABC scale 950/1600= 59% LEFS 48/80  COGNITION: Overall cognitive status: Within functional limits for tasks assessed     SENSATION: WFL  MUSCLE LENGTH: Hamstrings: Right 30 deg; Left 40 deg Thomas test: Right Knee 60 deg; Left knee 50 deg  POSTURE: rounded shoulders, forward head, decreased lumbar lordosis, and left pelvic obliquity  PALPATION: No pain elicited.   LUMBAR ROM: Major loss in flex/SB/Rotation/Ext with tightness and without pain    LOWER EXTREMITY ROM:   BLE WFL with calf and Hamstring tightness. L Ankle DF 90 degrees and PF 45 deg, R Ankle DF 7 deg and PF 45deg     LOWER EXTREMITY MMT: BLE WFL    LUMBAR SPECIAL TESTS:  Slump test: Negative, Quadrant test: Negative, Single leg stance test: Positive, FABER test: Positive, and Thomas test: Positive  FUNCTIONAL TESTS:  30 seconds chair stand test 12 reps completed Timed up and go (TUG): 21 secs 10 meter walk test: 13 secs= 1.3 sec/M SLT: unable on BLE Sami tandem and Tandem : Unable on BLE Standing with EC on NBOS: unable.   GAIT: Distance walked: 48ft Assistive device utilized: Single point cane Level of assistance: Modified independence Comments: Pt demonstrates decreased heel strike, shuffling gait to LLE, decreased swing, wide  base of support and fatigues easily.   TREATMENT DATE: 01/13/2024                                                                                                                               TA: PT discussed findings, anatomy, goals in details to  establish pt specific POC.   PATIENT EDUCATION:  Education details:  As above. Person educated: Patient Education method: Explanation Education comprehension: verbalized understanding and needs further education  HOME EXERCISE PROGRAM: Furnish in future treatments   ASSESSMENT:  CLINICAL IMPRESSION: Patient is a 83  y.o. male  who was seen today for physical therapy evaluation and treatment for balance problems. Pt goals is to walk at least 20 mins without falling and tiring. PT assessment revealed max loss of L/S ROM, gait deviations and balance problems evident by Pt unable to SLs, tandem stand, needs to use SPC for balance, difficulty walking and endurance issue effecting pt's neuromotor control. Pt's L/S tests and C/S test negative except FABER positive of tightness and no pain. Pt has a Hx of Prostate involvement and C/S surgery but the  balance  has been worsening prior to these Dx and procedures. Pt will benefit from PT interventions to address impairments and help pt achieve his goals.    OBJECTIVE IMPAIRMENTS: Abnormal gait, decreased activity tolerance, decreased balance, decreased endurance, decreased mobility, difficulty walking, and decreased ROM.   ACTIVITY LIMITATIONS: locomotion level, endurance  and balance and endurance  PARTICIPATION LIMITATIONS: shopping and community activity  PERSONAL FACTORS: 1-2 comorbidities: Hx of prostate and neck surgery  are also affecting patient's functional outcome.   REHAB POTENTIAL: Fair due to chronicity of the symptoms   CLINICAL DECISION MAKING: Stable/uncomplicated  EVALUATION COMPLEXITY: Low   GOALS: Goals reviewed with patient? Yes  SHORT TERM GOALS: Target date: 02/11/2024  Pt  will demonstrate Independent with HEP without falls to provide long term benefit of PT interventions and pt safety.  Baseline: Needs further information Goal status: INITIAL  2. Pt will improve hamstring length to 10 degrees in B knees to demonstrate improved LE functions.  Baseline: 40 deg L and 30 deg R.    LONG TERM GOALS: Target date: 03/24/2024  Pt will improve TUG to 15 secs to reduce fall risk Baseline: 21 secs Goal status: INITIAL  2.  Pt will improve 617M gait speed to 17M/sec without AD  to demonstrate improve community ambulation and safety Baseline: 1.3sec/M Goal status: INITIAL  3.  Pt will be able to SLT for 6 secs without LOB to reduce fall risk Baseline: Unable Goal status: INITIAL  4.  Pt will tandem stand for 10 secs without LOB to reduce fall risk Baseline: Unable Goal status: INITIAL  5.  Pt will be able to walk at least 20 mins with or without AD independently without LOB to demonstrate improved QOL.  Baseline: 136ft max Goal status: INITIAL    PLAN:  PT FREQUENCY: 1x/week  PT DURATION: 10 weeks  PLANNED INTERVENTIONS: 97110-Therapeutic exercises, 97530- Therapeutic activity, [id number]- Neuromuscular re-education, 97535- Self Care, [address]- Manual therapy, and 97116- Gait training.  PLAN FOR NEXT SESSION: Calf, hamstring, quads and Iliopsoas stretch, NuStep and Dynamic balance activities.    Janet Berlin PT DPT 1:13 PM,01/13/24

## 2024-01-18 ENCOUNTER — Ambulatory Visit: Payer: Medicare PPO | Admitting: Physical Therapy

## 2024-01-19 NOTE — Therapy (Signed)
OUTPATIENT PHYSICAL THERAPY THORACOLUMBAR TREATMENT   Patient Name: Richard Alexander MRN: [id number] DOB:[birth year], 83 y.o., male Today's Date: 01/20/2024  END OF SESSION:  PT End of Session - 01/20/24 1029     Visit Number 2    Number of Visits 10    Date for PT Re-Evaluation 03/24/24    PT Start Time 1030    PT Stop Time 1112    PT Time Calculation (min) 42 min    Activity Tolerance Patient limited by fatigue    Behavior During Therapy [facility] for tasks assessed/performed              Past Medical History:  Diagnosis Date   Adenomatous polyp    Allergic rhinitis    Aortic atherosclerosis (HCC)    Arthritis    BPH (benign prostatic hyperplasia)    DDD (degenerative disc disease), cervical    Diuretic-induced hypokalemia    Erectile dysfunction    Esophageal diverticulum    HLD (hyperlipidemia)    Hypertension    Insomnia    LBBB (left bundle branch block)    a.) noted on MPI performed 09/17/2016   Lumbar spinal stenosis    OSA on CPAP    Photoallergic dermatitis    Presence of permanent cardiac pacemaker    a.) Medtronic device   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   CARPAL TUNNEL RELEASE Right 10/28/2021   Procedure: ENDOSCOPIC RIGHT CARPAL TUNNEL RELEASE AND SUBCUTANEOUS ANTERIOR TRANSPOSITION OF ULNAR NERVE AT RIGHT ELBOW.;  Surgeon: Christena Flake, MD;  Location: [facility] ORS;  Service: Orthopedics;  Laterality: Right;   COLONOSCOPY WITH PROPOFOL N/A 07/25/2018   Procedure: COLONOSCOPY WITH PROPOFOL;  Surgeon: Scot Jun, MD;  Location: [facility] ENDOSCOPY;  Service: Endoscopy;  Laterality: N/A;   HAMMER TOE SURGERY Right 2007   2nd toe   LUMBAR LAMINECTOMY/DECOMPRESSION MICRODISCECTOMY Bilateral 10/28/2016    Procedure: LUMBAR LAMINECTOMY/DECOMPRESSION MICRODISCECTOMY 3 LEVELS;  Surgeon: Venetia Night, MD;  Location: [facility] ORS;  Service: Neurosurgery;  Laterality: Bilateral;  L3-4, L4-5,L5-S1 Laminectomies and bilateral foraminotomies   PACEMAKER INSERTION  2011   PPM GENERATOR CHANGEOUT N/A 12/17/2020   Procedure: PPM GENERATOR CHANGEOUT;  Surgeon: Marcina Millard, MD;  Location: [facility] INVASIVE CV LAB;  Service: Cardiovascular;  Laterality: N/A;   RADIOACTIVE SEED IMPLANT N/A 11/07/2018   Procedure: RADIOACTIVE SEED IMPLANT/BRACHYTHERAPY IMPLANT;  Surgeon: Vanna Scotland, MD;  Location: [facility] ORS;  Service: Urology;  Laterality: N/A;   Patient Active Problem List   Diagnosis Date Noted   Hyperlipidemia 05/28/2020   Claudication (HCC) 05/28/2020   Primary osteoarthritis of right shoulder 11/01/2018   Prostate cancer (HCC) 09/30/2018   Obesity (BMI 35.0-39.9 without comorbidity) 06/21/2017   BPH with elevated PSA 11/23/2016   Lumbar stenosis with neurogenic claudication 10/28/2016   Allergic rhinitis 10/20/2016   Arthritis 10/20/2016   Erectile dysfunction 10/20/2016   Photoallergic dermatitis 10/20/2016   Rotator cuff syndrome of left shoulder 10/20/2016   Left leg numbness 04/09/2016   Lumbosacral radiculopathy at S1 01/23/2016   High risk medication use 09/25/2015   Anemia due to medication 09/17/2014   History of hypokalemia 09/17/2014   Mild anemia 09/17/2014   Diuretic-induced hypokalemia 09/17/2014   DDD (degenerative disc disease), cervical  09/16/2014   History of adenomatous polyp of colon 09/16/2014   Insomnia 09/16/2014   OSA on CPAP 09/16/2014   Benign essential hypertension 04/18/2014   Sinoatrial node dysfunction (HCC) 04/18/2014   Pure hypercholesterolemia 04/18/2014    PCP: Maudie Flakes MD  REFERRING PROVIDER: Filomena Jungling MD  REFERRING DIAG: Balance problems  Rationale for Evaluation and Treatment: Rehabilitation  THERAPY DIAG:  Unsteady on feet,  Fall risk  ONSET DATE: since 2020  SUBJECTIVE:                                                                                                                                                                                           SUBJECTIVE STATEMENT: Pt reported: " I am here because my leg don't have energy. I am strong but when I get up I struggle for the first few steps and then it imporves but my leg gets tired with ~100 ft of walking. I have to sit down some where otherwise I can't remain standing. When I stand for 10 mins or so my low back gets tired and I have to sit down to take rest. When I go to church, I don't go anywhere to stand without any support and I don't sit without support. With support I can stand longer and safely. I furniture walk intermittently at home. I have had 4 to 5 Back injections for pain which has helped. I have to be careful enough to lift my legs so that I don't tripp."  PERTINENT HISTORY:  Patient is a pleasant 83 y.o. male seen in follow-up for the evaluation of low back and bilateral leg pain as well as right shoulder pain. He is status post bilateral S1 TFESI 09/24/2023 with 100% improvement. He denies any problems or complications postinjection. He rates his pain as a 0/10. It is intermittent, aching. He continues to note weakness in his legs but denies any numbness, tingling or loss of control of bowel or bladder. Pain is chronic. Overall he is happy the results from the prior lumbar epidural steroid injections. He continues to take gabapentin 600 mg 3 times a day Patient is  now out close to 5 years having completed I-125 interstitial implant for Gleason 7 adenocarcinoma the prostate seen today in routine follow-up he is doing well specifically denies any increased lower urinary tract symptoms diarrhea or fatigue. ural injections in his cervical spine in 2022. He is status post L3-S1 decompression in 2018 with Dr. Myer Haff. He also has a prior C3-4 ACDF.     PAIN:  Are you having pain? No  PRECAUTIONS: Fall and ICD/Pacemaker  RED FLAGS: None  WEIGHT BEARING RESTRICTIONS: No  FALLS:  Has patient fallen in last 6 months? No  LIVING ENVIRONMENT: Lives with: lives alone Lives in: House/apartment Stairs: Yes: External: 2 steps; on left going up Has following equipment at home: Single point cane  OCCUPATION: Retired  PLOF: Independent  PATIENT GOALS: " I want to be able to walk at least 20 mins without falling and getting tired."  NEXT MD VISIT: None at this point  OBJECTIVE:  Note: Objective measures were completed at Evaluation unless otherwise noted.    DIAGNOSTIC FINDINGS: See chart   PATIENT SURVEYS:  ABC scale 950/1600= 59% LEFS 48/80  COGNITION: Overall cognitive status: Within functional limits for tasks assessed     SENSATION: WFL  MUSCLE LENGTH: Hamstrings: Right 30 deg; Left 40 deg Thomas test: Right Knee 60 deg; Left knee 50 deg  POSTURE: rounded shoulders, forward head, decreased lumbar lordosis, and left pelvic obliquity  PALPATION: No pain elicited.   LUMBAR ROM: Major loss in flex/SB/Rotation/Ext with tightness and without pain    LOWER EXTREMITY ROM:   BLE WFL with calf and Hamstring tightness. L Ankle DF 90 degrees and PF 45 deg, R Ankle DF 7 deg and PF 45deg     LOWER EXTREMITY MMT: BLE WFL    LUMBAR SPECIAL TESTS:  Slump test: Negative, Quadrant test: Negative, Single leg stance test: Positive, FABER test: Positive, and Thomas test: Positive  FUNCTIONAL TESTS:  30 seconds chair stand test 12 reps completed Timed up and go (TUG): 21 secs 10 meter walk test: 13 secs= 1.3 sec/M SLT: unable on BLE Sami tandem and Tandem : Unable on BLE Standing with EC on NBOS: unable.   GAIT: Distance walked: 23ft Assistive device utilized: Single point cane Level of assistance: Modified independence Comments: Pt demonstrates decreased heel strike, shuffling gait to LLE, decreased swing, wide  base of support and fatigues easily.   TREATMENT DATE: 01/20/24                                                                                                                               Subjective: Patient denies falls since last session.    Therapeutic Exercise - improved strength as needed to improve performance of CKC activities/functional movements and as needed for power production to prevent fall during episode of large postural perturbation  Seated hamstring stretch 2 x 30 seconds on each LE Sit to stand 2 x 10  Seated march 2 x 10 each LE  Seated LAQ 2 x 10 each LE  Standing heel raises 2 x 10 each LE with BUE support  Standing marching 2 x 10 each LE with BUE support  Standing 6" step taps 2 x 10 with single UE support   Nustep level 1 x 8 minutes to improve cardiorespiratory endurance and muscle strength   PATIENT EDUCATION:  Education details:  As above. Person educated: Patient Education method: Explanation Education comprehension: verbalized understanding and needs further education  HOME EXERCISE PROGRAM: Access Code: [email or web address] URL: [email or web address] Date: 01/20/2024 Prepared by: Maylon Peppers  Exercises - Seated Hamstring Stretch  - 2-3 x daily - 5-7 x weekly - 3-5 reps - 30-60 second  hold - Sit to Stand with Arms Crossed  - 2-3 x daily - 5-7 x weekly - 3 sets - 10 reps - Seated March  - 2-3 x daily - 5-7 x weekly - 3 sets - 10 reps - Seated Long Arc Quad  - 2-3 x daily - 5-7 x weekly - 3 sets - 10 reps - Heel Raises with Counter Support  - 2-3 x daily - 5-7 x weekly - 3 sets - 10 reps    ASSESSMENT:  CLINICAL IMPRESSION:   Patient arrives to treatment session motivated to participate. Session focused on HEP establishment with a focus of seated and standing BLE strengthening. Able to demonstrate good understanding of exercises. Handout provided. Pt will benefit from PT interventions to address impairments and help pt achieve his goals.     OBJECTIVE IMPAIRMENTS: Abnormal gait, decreased activity tolerance, decreased balance, decreased endurance, decreased mobility, difficulty walking, and decreased ROM.   ACTIVITY LIMITATIONS: locomotion level, endurance  and balance and endurance  PARTICIPATION LIMITATIONS: shopping and community activity  PERSONAL FACTORS: 1-2 comorbidities: Hx of prostate and neck surgery  are also affecting patient's functional outcome.   REHAB POTENTIAL: Fair due to chronicity of the symptoms   CLINICAL DECISION MAKING: Stable/uncomplicated  EVALUATION COMPLEXITY: Low   GOALS: Goals reviewed with patient? Yes  SHORT TERM GOALS: Target date: 02/11/2024  Pt will demonstrate Independent with HEP without falls to provide long term benefit of PT interventions and pt safety.  Baseline: Needs further information Goal status: INITIAL  2. Pt will improve hamstring length to 10 degrees in B knees to demonstrate improved LE functions.  Baseline: 40 deg L and 30 deg R.    LONG TERM GOALS: Target date: 03/24/2024  Pt will improve TUG to 15 secs to reduce fall risk Baseline: 21 secs Goal status: INITIAL  2.  Pt will improve 757M gait speed to 57M/sec without AD  to demonstrate improve community ambulation and safety Baseline: 1.3sec/M Goal status: INITIAL  3.  Pt will be able to SLT for 6 secs without LOB to reduce fall risk Baseline: Unable Goal status: INITIAL  4.  Pt will tandem stand for 10 secs without LOB to reduce fall risk Baseline: Unable Goal status: INITIAL  5.  Pt will be able to walk at least 20 mins with or without AD independently without LOB to demonstrate improved QOL.  Baseline: 140ft max Goal status: INITIAL    PLAN:  PT FREQUENCY: 1x/week  PT DURATION: 10 weeks  PLANNED INTERVENTIONS: 97110-Therapeutic exercises, 97530- Therapeutic activity, [id number]- Neuromuscular re-education, 97535- Self Care, 13244- Manual therapy, and 97116- Gait training.  PLAN FOR NEXT SESSION:  Calf, hamstring, quads and Iliopsoas stretch, NuStep and Dynamic balance activities.    Maylon Peppers, PT, DPT Physical Therapist - [place]  [facility]  10:30 AM,01/20/24

## 2024-01-20 ENCOUNTER — Ambulatory Visit: Payer: Medicare PPO | Admitting: Physical Therapy

## 2024-01-20 ENCOUNTER — Encounter: Payer: Self-pay | Admitting: Physical Therapy

## 2024-01-20 DIAGNOSIS — M6289 Other specified disorders of muscle: Secondary | ICD-10-CM

## 2024-01-20 DIAGNOSIS — R262 Difficulty in walking, not elsewhere classified: Secondary | ICD-10-CM

## 2024-01-20 DIAGNOSIS — M6281 Muscle weakness (generalized): Secondary | ICD-10-CM | POA: Diagnosis not present

## 2024-01-20 DIAGNOSIS — R2681 Unsteadiness on feet: Secondary | ICD-10-CM | POA: Diagnosis not present

## 2024-01-25 ENCOUNTER — Encounter: Payer: Medicare PPO | Admitting: Physical Therapy

## 2024-01-27 ENCOUNTER — Ambulatory Visit: Payer: Medicare PPO | Attending: Physical Medicine & Rehabilitation

## 2024-01-27 DIAGNOSIS — M6289 Other specified disorders of muscle: Secondary | ICD-10-CM | POA: Diagnosis not present

## 2024-01-27 DIAGNOSIS — R2681 Unsteadiness on feet: Secondary | ICD-10-CM | POA: Insufficient documentation

## 2024-01-27 DIAGNOSIS — R262 Difficulty in walking, not elsewhere classified: Secondary | ICD-10-CM | POA: Insufficient documentation

## 2024-01-27 DIAGNOSIS — M6281 Muscle weakness (generalized): Secondary | ICD-10-CM | POA: Insufficient documentation

## 2024-01-27 NOTE — Therapy (Signed)
 OUTPATIENT PHYSICAL THERAPY TREATMENT   Patient Name: Richard Alexander MRN: [id number] DOB:12-23-1940, 83 y.o., male Today's Date: 01/27/2024  END OF SESSION:  PT End of Session - 01/27/24 1035     Visit Number 3    Number of Visits 10    Date for PT Re-Evaluation 03/24/24    Authorization Type [employer]    PT Start Time 1030    PT Stop Time 1110    PT Time Calculation (min) 40 min    Activity Tolerance No increased pain    Behavior During Therapy WFL for tasks assessed/performed              Past Medical History:  Diagnosis Date   Adenomatous polyp    Allergic rhinitis    Aortic atherosclerosis (HCC)    Arthritis    BPH (benign prostatic hyperplasia)    DDD (degenerative disc disease), cervical    Diuretic-induced hypokalemia    Erectile dysfunction    Esophageal diverticulum    HLD (hyperlipidemia)    Hypertension    Insomnia    LBBB (left bundle branch block)    a.) noted on MPI performed 09/17/2016   Lumbar spinal stenosis    OSA on CPAP    Photoallergic dermatitis    Presence of permanent cardiac pacemaker    a.) Medtronic device   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   CARPAL TUNNEL RELEASE Right 10/28/2021   Procedure: ENDOSCOPIC RIGHT CARPAL TUNNEL RELEASE AND SUBCUTANEOUS ANTERIOR TRANSPOSITION OF ULNAR NERVE AT RIGHT ELBOW.;  Surgeon: Edie Norleen PARAS, MD;  Location: [facility] ORS;  Service: Orthopedics;  Laterality: Right;   COLONOSCOPY WITH PROPOFOL  N/A 07/25/2018   Procedure: COLONOSCOPY WITH PROPOFOL ;  Surgeon: Viktoria Lamar DASEN, MD;  Location: [facility] ENDOSCOPY;  Service: Endoscopy;  Laterality: N/A;   HAMMER TOE SURGERY Right 2007   2nd toe   LUMBAR LAMINECTOMY/DECOMPRESSION MICRODISCECTOMY Bilateral  10/28/2016   Procedure: LUMBAR LAMINECTOMY/DECOMPRESSION MICRODISCECTOMY 3 LEVELS;  Surgeon: Reeves Daisy, MD;  Location: [facility] ORS;  Service: Neurosurgery;  Laterality: Bilateral;  L3-4, L4-5,L5-S1 Laminectomies and bilateral foraminotomies   PACEMAKER INSERTION  2011   PPM GENERATOR CHANGEOUT N/A 12/17/2020   Procedure: PPM GENERATOR CHANGEOUT;  Surgeon: Ammon Blunt, MD;  Location: [facility] INVASIVE CV LAB;  Service: Cardiovascular;  Laterality: N/A;   RADIOACTIVE SEED IMPLANT N/A 11/07/2018   Procedure: RADIOACTIVE SEED IMPLANT/BRACHYTHERAPY IMPLANT;  Surgeon: Penne Knee, MD;  Location: [facility] ORS;  Service: Urology;  Laterality: N/A;   Patient Active Problem List   Diagnosis Date Noted   Hyperlipidemia 05/28/2020   Claudication (HCC) 05/28/2020   Primary osteoarthritis of right shoulder 11/01/2018   Prostate cancer (HCC) 09/30/2018   Obesity (BMI 35.0-39.9 without comorbidity) 06/21/2017   BPH with elevated PSA 11/23/2016   Lumbar stenosis with neurogenic claudication 10/28/2016   Allergic rhinitis 10/20/2016   Arthritis 10/20/2016   Erectile dysfunction 10/20/2016   Photoallergic dermatitis 10/20/2016   Rotator cuff syndrome of left shoulder 10/20/2016   Left leg numbness 04/09/2016   Lumbosacral radiculopathy at S1 01/23/2016   High risk medication use 09/25/2015   Anemia due to medication 09/17/2014   History of hypokalemia 09/17/2014   Mild anemia 09/17/2014   Diuretic-induced hypokalemia 09/17/2014  DDD (degenerative disc disease), cervical 09/16/2014   History of adenomatous polyp of colon 09/16/2014   Insomnia 09/16/2014   OSA on CPAP 09/16/2014   Benign essential hypertension 04/18/2014   Sinoatrial node dysfunction (HCC) 04/18/2014   Pure hypercholesterolemia 04/18/2014    PCP: Jeffie Craze MD  REFERRING PROVIDER: Delon Gins MD  REFERRING DIAG: Balance problems  Rationale for Evaluation and Treatment: Rehabilitation  THERAPY DIAG:   Unsteady on feet, Fall risk  ONSET DATE: since 2020  SUBJECTIVE:                                                                                                                                                                                           SUBJECTIVE STATEMENT: Pt says no updates since last session. HEP is fine, ST Sthe hardest. No falls.   PERTINENT HISTORY:  Patient is a pleasant 83 y.o. male seen in follow-up for the evaluation of low back and bilateral leg pain as well as right shoulder pain. He is status post bilateral S1 TFESI 09/24/2023 with 100% improvement. He denies any problems or complications postinjection. He rates his pain as a 0/10. It is intermittent, aching. He continues to note weakness in his legs but denies any numbness, tingling or loss of control of bowel or bladder. Pain is chronic. Overall he is happy the results from the prior lumbar epidural steroid injections. He continues to take gabapentin  600 mg 3 times a day Patient is  now out close to 5 years having completed I-125 interstitial implant for Gleason 7 adenocarcinoma the prostate seen today in routine follow-up he is doing well specifically denies any increased lower urinary tract symptoms diarrhea or fatigue. ural injections in his cervical spine in 2022. He is status post L3-S1 decompression in 2018 with Dr. Clois. He also has a prior C3-4 ACDF.    PAIN:  Are you having pain? No  PRECAUTIONS: Fall and ICD/Pacemaker  RED FLAGS: None   WEIGHT BEARING RESTRICTIONS: No  FALLS:  Has patient fallen in last 6 months? No  LIVING ENVIRONMENT: Lives with: lives alone Lives in: House/apartment Stairs: Yes: External: 2 steps; on left going up Has following equipment at home: Single point cane  OCCUPATION: Retired  PLOF: Independent  PATIENT GOALS:  I want to be able to walk at least 20 mins without falling and getting tired.  NEXT MD VISIT: None at this point  OBJECTIVE:  Note: Objective  measures were completed at Evaluation unless otherwise noted.  DIAGNOSTIC FINDINGS: See chart  PATIENT SURVEYS:  ABC scale 950/1600= 59% LEFS 48/80  COGNITION: Overall cognitive status: Within functional limits for tasks assessed  SENSATION: WFL  MUSCLE LENGTH: Hamstrings: Right 30 deg; Left 40 deg Thomas test: Right Knee 60 deg; Left knee 50 deg  POSTURE: rounded shoulders, forward head, decreased lumbar lordosis, and left pelvic obliquity  PALPATION: No pain elicited.   LUMBAR ROM: Major loss in flex/SB/Rotation/Ext with tightness and without pain    LOWER EXTREMITY ROM:   BLE WFL with calf and Hamstring tightness. L Ankle DF 90 degrees and PF 45 deg, R Ankle DF 7 deg and PF 45deg    LOWER EXTREMITY MMT: BLE WFL    LUMBAR SPECIAL TESTS:  Slump test: Negative, Quadrant test: Negative, Single leg stance test: Positive, FABER test: Positive, and Thomas test: Positive  FUNCTIONAL TESTS:  30 seconds chair stand test 12 reps completed Timed up and go (TUG): 21 secs 10 meter walk test: 13 secs= 1.3 sec/M SLT: unable on BLE Sami tandem and Tandem : Unable on BLE Standing with EC on NBOS: unable.   GAIT: Distance walked: 73ft Assistive device utilized: Single point cane Level of assistance: Modified independence Comments: Pt demonstrates decreased heel strike, shuffling gait to LLE, decreased swing, wide base of support and fatigues easily.   TREATMENT DATE: 01/27/24                                                                                                                               Sit to stand 1x10 from chair  Sit to stand 1x10 from plinth feet on airex (minGuard Assist, minA)  Sit to stand 1x10 from plinth feet on airex (minGuard Assist, minA) noted dysfunction of ankle DF in sagittal plane righting Overground AMB, no device (doesn't use one typically) x239ft (wide based gait)  Standing heel raises/toe raises 1 x 10 each LE with BUE support  Standing  marching 1 x 10 each LE with BUE support   Orthostatic pressures:  -160/91  88 seated -141/91  91 standing x0 minutes  -145//85 92 standing x1 minutes -121/63 93 standing x3 minutes   PATIENT EDUCATION:  Education details:  As above. Person educated: Patient Education method: Explanation Education comprehension: verbalized understanding and needs further education  HOME EXERCISE PROGRAM: Access Code: [email or web address] URL: [email or web address] Date: 01/20/2024 Prepared by: Maryanne Finder  Exercises - Seated Hamstring Stretch  - 2-3 x daily - 5-7 x weekly - 3-5 reps - 30-60 second  hold - Sit to Stand with Arms Crossed  - 2-3 x daily - 5-7 x weekly - 3 sets - 10 reps - Seated March  - 2-3 x daily - 5-7 x weekly - 3 sets - 10 reps - Seated Long Arc Quad  - 2-3 x daily - 5-7 x weekly - 3 sets - 10 reps - Heel Raises with Counter Support  - 2-3 x daily - 5-7 x weekly - 3 sets - 10 reps    ASSESSMENT:  CLINICAL IMPRESSION:   PT c/o continued fatigue in legs with time up. Discussed this as a feature  of both lumbar spinal stenosis as well as orthostatic hypotension- his gait presents with wide based ataxia and ankle weakness, but he doe sin fact has BP drop over 3 minutes up that has positive correlation with symptoms. Will contact PCP and make aware. Pt will benefit from PT interventions to address impairments and help pt achieve his goals.    OBJECTIVE IMPAIRMENTS: Abnormal gait, decreased activity tolerance, decreased balance, decreased endurance, decreased mobility, difficulty walking, and decreased ROM.   ACTIVITY LIMITATIONS: locomotion level, endurance  and balance and endurance  PARTICIPATION LIMITATIONS: shopping and community activity  PERSONAL FACTORS: 1-2 comorbidities: Hx of prostate and neck surgery  are also affecting patient's functional outcome.   REHAB POTENTIAL: Fair due to chronicity of the symptoms   CLINICAL DECISION MAKING:  Stable/uncomplicated  EVALUATION COMPLEXITY: Low   GOALS: Goals reviewed with patient? Yes  SHORT TERM GOALS: Target date: 02/11/2024  Pt will demonstrate Independent with HEP without falls to provide long term benefit of PT interventions and pt safety.  Baseline: Needs further information Goal status: INITIAL  2. Pt will improve hamstring length to 10 degrees in B knees to demonstrate improved LE functions.  Baseline: 40 deg L and 30 deg R.    LONG TERM GOALS: Target date: 03/24/2024  Pt will improve TUG to 15 secs to reduce fall risk Baseline: 21 secs Goal status: INITIAL  2.  Pt will improve 58M gait speed to 55M/sec without AD  to demonstrate improve community ambulation and safety Baseline: 1.3sec/M Goal status: INITIAL  3.  Pt will be able to SLT for 6 secs without LOB to reduce fall risk Baseline: Unable Goal status: INITIAL  4.  Pt will tandem stand for 10 secs without LOB to reduce fall risk Baseline: Unable Goal status: INITIAL  5.  Pt will be able to walk at least 20 mins with or without AD independently without LOB to demonstrate improved QOL.  Baseline: 182ft max Goal status: INITIAL    PLAN:  PT FREQUENCY: 1x/week  PT DURATION: 10 weeks  PLANNED INTERVENTIONS: 97110-Therapeutic exercises, 97530- Therapeutic activity, [id number]- Neuromuscular re-education, 97535- Self Care, [id number]- Manual therapy, and 97116- Gait training.  PLAN FOR NEXT SESSION: Calf, hamstring, quads and Iliopsoas stretch, NuStep and Dynamic balance activities.   11:20 AM, 01/27/24 Peggye JAYSON Linear, PT, DPT Physical Therapist - [facility] in [phone] (Office)     10:38 AM,01/27/24

## 2024-02-01 ENCOUNTER — Encounter: Payer: Medicare PPO | Admitting: Physical Therapy

## 2024-02-02 NOTE — Therapy (Signed)
 OUTPATIENT PHYSICAL THERAPY THORACOLUMBAR TREATMENT   Patient Name: Richard Alexander MRN: [id number] DOB:1941-08-26, 83 y.o., male Today's Date: 02/03/2024  END OF SESSION:  PT End of Session - 02/03/24 1030     Visit Number 4    Number of Visits 10    Date for PT Re-Evaluation 03/24/24    Authorization Type [employer]    PT Start Time 1030    PT Stop Time 1112    PT Time Calculation (min) 42 min    Activity Tolerance No increased pain    Behavior During Therapy WFL for tasks assessed/performed               Past Medical History:  Diagnosis Date   Adenomatous polyp    Allergic rhinitis    Aortic atherosclerosis (HCC)    Arthritis    BPH (benign prostatic hyperplasia)    DDD (degenerative disc disease), cervical    Diuretic-induced hypokalemia    Erectile dysfunction    Esophageal diverticulum    HLD (hyperlipidemia)    Hypertension    Insomnia    LBBB (left bundle branch block)    a.) noted on MPI performed 09/17/2016   Lumbar spinal stenosis    OSA on CPAP    Photoallergic dermatitis    Presence of permanent cardiac pacemaker    a.) Medtronic device   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   CARPAL TUNNEL RELEASE Right 10/28/2021   Procedure: ENDOSCOPIC RIGHT CARPAL TUNNEL RELEASE AND SUBCUTANEOUS ANTERIOR TRANSPOSITION OF ULNAR NERVE AT RIGHT ELBOW.;  Surgeon: Christena Flake, MD;  Location: [facility] ORS;  Service: Orthopedics;  Laterality: Right;   COLONOSCOPY WITH PROPOFOL N/A 07/25/2018   Procedure: COLONOSCOPY WITH PROPOFOL;  Surgeon: Scot Jun, MD;  Location: [facility] ENDOSCOPY;  Service: Endoscopy;  Laterality: N/A;   HAMMER TOE SURGERY Right 2007   2nd toe   LUMBAR LAMINECTOMY/DECOMPRESSION  MICRODISCECTOMY Bilateral 10/28/2016   Procedure: LUMBAR LAMINECTOMY/DECOMPRESSION MICRODISCECTOMY 3 LEVELS;  Surgeon: Venetia Night, MD;  Location: [facility] ORS;  Service: Neurosurgery;  Laterality: Bilateral;  L3-4, L4-5,L5-S1 Laminectomies and bilateral foraminotomies   PACEMAKER INSERTION  2011   PPM GENERATOR CHANGEOUT N/A 12/17/2020   Procedure: PPM GENERATOR CHANGEOUT;  Surgeon: Marcina Millard, MD;  Location: [facility] INVASIVE CV [facility];  Service: Cardiovascular;  Laterality: N/A;   RADIOACTIVE SEED IMPLANT N/A 11/07/2018   Procedure: RADIOACTIVE SEED IMPLANT/BRACHYTHERAPY IMPLANT;  Surgeon: Vanna Scotland, MD;  Location: [facility] ORS;  Service: Urology;  Laterality: N/A;   Patient Active Problem List   Diagnosis Date Noted   Hyperlipidemia 05/28/2020   Claudication (HCC) 05/28/2020   Primary osteoarthritis of right shoulder 11/01/2018   Prostate cancer (HCC) 09/30/2018   Obesity (BMI 35.0-39.9 without comorbidity) 06/21/2017   BPH with elevated PSA 11/23/2016   Lumbar stenosis with neurogenic claudication 10/28/2016   Allergic rhinitis 10/20/2016   Arthritis 10/20/2016   Erectile dysfunction 10/20/2016   Photoallergic dermatitis 10/20/2016   Rotator cuff syndrome of left shoulder 10/20/2016   Left leg numbness 04/09/2016   Lumbosacral radiculopathy at S1 01/23/2016   High risk medication use 09/25/2015   Anemia due to medication 09/17/2014   History of hypokalemia 09/17/2014   Mild anemia 09/17/2014   Diuretic-induced hypokalemia 09/17/2014  DDD (degenerative disc disease), cervical 09/16/2014   History of adenomatous polyp of colon 09/16/2014   Insomnia 09/16/2014   OSA on CPAP 09/16/2014   Benign essential hypertension 04/18/2014   Sinoatrial node dysfunction (HCC) 04/18/2014   Pure hypercholesterolemia 04/18/2014    PCP: Maudie Flakes MD  REFERRING PROVIDER: Filomena Jungling MD  REFERRING DIAG: Balance problems  Rationale for Evaluation and Treatment:  Rehabilitation  THERAPY DIAG:  Unsteady on feet, Fall risk  ONSET DATE: since 2020  SUBJECTIVE:                                                                                                                                                                                           SUBJECTIVE STATEMENT: Pt reported: " I am here because my leg don't have energy. I am strong but when I get up I struggle for the first few steps and then it imporves but my leg gets tired with ~100 ft of walking. I have to sit down some where otherwise I can't remain standing. When I stand for 10 mins or so my low back gets tired and I have to sit down to take rest. When I go to church, I don't go anywhere to stand without any support and I don't sit without support. With support I can stand longer and safely. I furniture walk intermittently at home. I have had 4 to 5 Back injections for pain which has helped. I have to be careful enough to lift my legs so that I don't tripp."  PERTINENT HISTORY:  Patient is a pleasant 83 y.o. male seen in follow-up for the evaluation of low back and bilateral leg pain as well as right shoulder pain. He is status post bilateral S1 TFESI 09/24/2023 with 100% improvement. He denies any problems or complications postinjection. He rates his pain as a 0/10. It is intermittent, aching. He continues to note weakness in his legs but denies any numbness, tingling or loss of control of bowel or bladder. Pain is chronic. Overall he is happy the results from the prior lumbar epidural steroid injections. He continues to take gabapentin 600 mg 3 times a day Patient is  now out close to 5 years having completed I-125 interstitial implant for Gleason 7 adenocarcinoma the prostate seen today in routine follow-up he is doing well specifically denies any increased lower urinary tract symptoms diarrhea or fatigue. ural injections in his cervical spine in 2022. He is status post L3-S1 decompression in 2018 with Dr.  Myer Haff. He also has a prior C3-4 ACDF.    PAIN:  Are you having pain? No  PRECAUTIONS: Fall and ICD/Pacemaker  RED FLAGS: None   WEIGHT BEARING RESTRICTIONS: No  FALLS:  Has patient fallen in last 6 months? No  LIVING ENVIRONMENT: Lives with: lives alone Lives in: House/apartment Stairs: Yes: External: 2 steps; on left going up Has following equipment at home: Single point cane  OCCUPATION: Retired  PLOF: Independent  PATIENT GOALS: " I want to be able to walk at least 20 mins without falling and getting tired."  NEXT MD VISIT: None at this point  OBJECTIVE:  Note: Objective measures were completed at Evaluation unless otherwise noted.    DIAGNOSTIC FINDINGS: See chart   PATIENT SURVEYS:  ABC scale 950/1600= 59% LEFS 48/80  COGNITION: Overall cognitive status: Within functional limits for tasks assessed     SENSATION: WFL  MUSCLE LENGTH: Hamstrings: Right 30 deg; Left 40 deg Thomas test: Right Knee 60 deg; Left knee 50 deg  POSTURE: rounded shoulders, forward head, decreased lumbar lordosis, and left pelvic obliquity  PALPATION: No pain elicited.   LUMBAR ROM: Major loss in flex/SB/Rotation/Ext with tightness and without pain    LOWER EXTREMITY ROM:   BLE WFL with calf and Hamstring tightness. L Ankle DF 90 degrees and PF 45 deg, R Ankle DF 7 deg and PF 45deg     LOWER EXTREMITY MMT: BLE WFL    LUMBAR SPECIAL TESTS:  Slump test: Negative, Quadrant test: Negative, Single leg stance test: Positive, FABER test: Positive, and Thomas test: Positive  FUNCTIONAL TESTS:  30 seconds chair stand test 12 reps completed Timed up and go (TUG): 21 secs 10 meter walk test: 13 secs= 1.3 sec/M SLT: unable on BLE Sami tandem and Tandem : Unable on BLE Standing with EC on NBOS: unable.   GAIT: Distance walked: 27ft Assistive device utilized: Single point cane Level of assistance: Modified independence Comments: Pt demonstrates decreased heel strike,  shuffling gait to LLE, decreased swing, wide base of support and fatigues easily.   TREATMENT DATE: 02/03/24                                                                                                                               Subjective: Patient denies falls since last session. HEP is going well.   Orthostatic BPs  Sitting 150/85 HR 81  Standing 135/81 HR 84  Standing after 3 min 132/83 HR 90   Gait belt donned for all standing activities - required CGA-minA throughout due to balance  Therapeutic Act - improved strength as needed to improve performance of CKC activities/functional movements and as needed for power production to prevent fall during episode of large postural perturbation  Sit to stand 2 x 10  Seated LAQ 2 x 15 each LE 2# AW Standing heel toe raises with 2# AW 2 x 10 with B UE support  Nustep level 2 x 8 minutes to improve cardiorespiratory endurance and muscle strength   Neuromuscular Re-education - for improved sensory integration, static and dynamic postural control, equilibrium and non-equilibrium  coordination as needed for negotiating home and community environment and stepping over obstacles  Dynamic marching on blue agility ladder with 2# AW x 2 laps  Standing narrow BOS 2 x 30 seconds Standing narrow BOS with vertical head turns x 1 minute - R lateral LOB  Standing narrow BOS with horizontal head turns x 1 minute - posterior LOB   Education provided on orthostatic hypotension and symptoms related to mobility.   PATIENT EDUCATION:  Education details:  As above. Person educated: Patient Education method: Explanation Education comprehension: verbalized understanding and needs further education  HOME EXERCISE PROGRAM: Access Code: [email or web address] URL: [email or web address] Date: 01/20/2024 Prepared by: Maylon Peppers  Exercises - Seated Hamstring Stretch  - 2-3 x daily - 5-7 x weekly - 3-5 reps - 30-60 second  hold - Sit to Stand with Arms  Crossed  - 2-3 x daily - 5-7 x weekly - 3 sets - 10 reps - Seated March  - 2-3 x daily - 5-7 x weekly - 3 sets - 10 reps - Seated Long Arc Quad  - 2-3 x daily - 5-7 x weekly - 3 sets - 10 reps - Heel Raises with Counter Support  - 2-3 x daily - 5-7 x weekly - 3 sets - 10 reps    ASSESSMENT:  CLINICAL IMPRESSION:     Assessment of orthostatic vitals obtained at beginning of session with 15 point drop in SBP with initial standing but patient denies symptoms. Education provided on orthostatic hypotension and potential symptoms related to mobility. Session focused on BLE strengthening and dynamic/static balance on level and unlevel surfaces. Gait belt donned throughout with patient requiring CGA-minA for balance. Pt will benefit from PT interventions to address impairments and help pt achieve his goals.    OBJECTIVE IMPAIRMENTS: Abnormal gait, decreased activity tolerance, decreased balance, decreased endurance, decreased mobility, difficulty walking, and decreased ROM.   ACTIVITY LIMITATIONS: locomotion level, endurance  and balance and endurance  PARTICIPATION LIMITATIONS: shopping and community activity  PERSONAL FACTORS: 1-2 comorbidities: Hx of prostate and neck surgery  are also affecting patient's functional outcome.   REHAB POTENTIAL: Fair due to chronicity of the symptoms   CLINICAL DECISION MAKING: Stable/uncomplicated  EVALUATION COMPLEXITY: Low   GOALS: Goals reviewed with patient? Yes  SHORT TERM GOALS: Target date: 02/11/2024  Pt will demonstrate Independent with HEP without falls to provide long term benefit of PT interventions and pt safety.  Baseline: Needs further information Goal status: INITIAL  2. Pt will improve hamstring length to 10 degrees in B knees to demonstrate improved LE functions.  Baseline: 40 deg L and 30 deg R.    LONG TERM GOALS: Target date: 03/24/2024  Pt will improve TUG to 15 secs to reduce fall risk Baseline: 21 secs Goal status:  INITIAL  2.  Pt will improve 16M gait speed to 74M/sec without AD  to demonstrate improve community ambulation and safety Baseline: 1.3sec/M Goal status: INITIAL  3.  Pt will be able to SLT for 6 secs without LOB to reduce fall risk Baseline: Unable Goal status: INITIAL  4.  Pt will tandem stand for 10 secs without LOB to reduce fall risk Baseline: Unable Goal status: INITIAL  5.  Pt will be able to walk at least 20 mins with or without AD independently without LOB to demonstrate improved QOL.  Baseline: 141ft max Goal status: INITIAL    PLAN:  PT FREQUENCY: 1x/week  PT DURATION: 10 weeks  PLANNED INTERVENTIONS: 97110-Therapeutic exercises, 97530- Therapeutic activity, 97112-  Neuromuscular re-education, [id number]- Self Care, 16109- Manual therapy, and [id number]- Gait training.  PLAN FOR NEXT SESSION: Calf, hamstring, quads and Iliopsoas stretch, NuStep and Dynamic balance activities.    Maylon Peppers, PT, DPT Physical Therapist - [place]  [facility]  10:31 AM,02/03/24

## 2024-02-03 ENCOUNTER — Ambulatory Visit: Payer: Medicare PPO | Admitting: Physical Therapy

## 2024-02-03 ENCOUNTER — Encounter: Payer: Self-pay | Admitting: Physical Therapy

## 2024-02-03 DIAGNOSIS — R262 Difficulty in walking, not elsewhere classified: Secondary | ICD-10-CM | POA: Diagnosis not present

## 2024-02-03 DIAGNOSIS — R2681 Unsteadiness on feet: Secondary | ICD-10-CM | POA: Diagnosis not present

## 2024-02-03 DIAGNOSIS — M6281 Muscle weakness (generalized): Secondary | ICD-10-CM | POA: Diagnosis not present

## 2024-02-03 DIAGNOSIS — M6289 Other specified disorders of muscle: Secondary | ICD-10-CM | POA: Diagnosis not present

## 2024-02-08 ENCOUNTER — Encounter: Payer: Medicare PPO | Admitting: Physical Therapy

## 2024-02-08 DIAGNOSIS — I10 Essential (primary) hypertension: Secondary | ICD-10-CM | POA: Diagnosis not present

## 2024-02-08 DIAGNOSIS — H26493 Other secondary cataract, bilateral: Secondary | ICD-10-CM | POA: Diagnosis not present

## 2024-02-08 DIAGNOSIS — H40023 Open angle with borderline findings, high risk, bilateral: Secondary | ICD-10-CM | POA: Diagnosis not present

## 2024-02-10 ENCOUNTER — Ambulatory Visit: Payer: Medicare PPO | Admitting: Physical Therapy

## 2024-02-15 ENCOUNTER — Encounter: Payer: Medicare PPO | Admitting: Physical Therapy

## 2024-02-16 NOTE — Therapy (Signed)
 OUTPATIENT PHYSICAL THERAPY THORACOLUMBAR TREATMENT   Patient Name: Richard Alexander MRN: [id number] DOB:08-Dec-1941, 83 y.o., male Today's Date: 02/17/2024  END OF SESSION:  PT End of Session - 02/17/24 1026     Visit Number 5    Number of Visits 10    Date for PT Re-Evaluation 03/24/24    Authorization Type [employer]    PT Start Time 1030    PT Stop Time 1112    PT Time Calculation (min) 42 min    Activity Tolerance No increased pain    Behavior During Therapy WFL for tasks assessed/performed                Past Medical History:  Diagnosis Date   Adenomatous polyp    Allergic rhinitis    Aortic atherosclerosis (HCC)    Arthritis    BPH (benign prostatic hyperplasia)    DDD (degenerative disc disease), cervical    Diuretic-induced hypokalemia    Erectile dysfunction    Esophageal diverticulum    HLD (hyperlipidemia)    Hypertension    Insomnia    LBBB (left bundle branch block)    a.) noted on MPI performed 09/17/2016   Lumbar spinal stenosis    OSA on CPAP    Photoallergic dermatitis    Presence of permanent cardiac pacemaker    a.) Medtronic device   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   CARPAL TUNNEL RELEASE Right 10/28/2021   Procedure: ENDOSCOPIC RIGHT CARPAL TUNNEL RELEASE AND SUBCUTANEOUS ANTERIOR TRANSPOSITION OF ULNAR NERVE AT RIGHT ELBOW.;  Surgeon: Christena Flake, MD;  Location: [facility] ORS;  Service: Orthopedics;  Laterality: Right;   COLONOSCOPY WITH PROPOFOL N/A 07/25/2018   Procedure: COLONOSCOPY WITH PROPOFOL;  Surgeon: Scot Jun, MD;  Location: [facility] ENDOSCOPY;  Service: Endoscopy;  Laterality: N/A;   HAMMER TOE SURGERY Right 2007   2nd toe   LUMBAR LAMINECTOMY/DECOMPRESSION  MICRODISCECTOMY Bilateral 10/28/2016   Procedure: LUMBAR LAMINECTOMY/DECOMPRESSION MICRODISCECTOMY 3 LEVELS;  Surgeon: Venetia Night, MD;  Location: [facility] ORS;  Service: Neurosurgery;  Laterality: Bilateral;  L3-4, L4-5,L5-S1 Laminectomies and bilateral foraminotomies   PACEMAKER INSERTION  2011   PPM GENERATOR CHANGEOUT N/A 12/17/2020   Procedure: PPM GENERATOR CHANGEOUT;  Surgeon: Marcina Millard, MD;  Location: [facility]IVE [facility];  Service: Cardiovascular;  Laterality: N/A;   RADIOACTIVE SEED IMPLANT N/A 11/07/2018   Procedure: RADIOACTIVE SEED IMPLANT/BRACHYTHERAPY IMPLANT;  Surgeon: Vanna Scotland, MD;  Location: [facility] ORS;  Service: Urology;  Laterality: N/A;   Patient Active Problem List   Diagnosis Date Noted   Hyperlipidemia 05/28/2020   Claudication (HCC) 05/28/2020   Primary osteoarthritis of right shoulder 11/01/2018   Prostate cancer (HCC) 09/30/2018   Obesity (BMI 35.0-39.9 without comorbidity) 06/21/2017   BPH with elevated PSA 11/23/2016   Lumbar stenosis with neurogenic claudication 10/28/2016   Allergic rhinitis 10/20/2016   Arthritis 10/20/2016   Erectile dysfunction 10/20/2016   Photoallergic dermatitis 10/20/2016   Rotator cuff syndrome of left shoulder 10/20/2016   Left leg numbness 04/09/2016   Lumbosacral radiculopathy at S1 01/23/2016   High risk medication use 09/25/2015   Anemia due to medication 09/17/2014   History of hypokalemia 09/17/2014   Mild anemia 09/17/2014   Diuretic-induced hypokalemia  09/17/2014   DDD (degenerative disc disease), cervical 09/16/2014   History of adenomatous polyp of colon 09/16/2014   Insomnia 09/16/2014   OSA on CPAP 09/16/2014   Benign essential hypertension 04/18/2014   Sinoatrial node dysfunction (HCC) 04/18/2014   Pure hypercholesterolemia 04/18/2014    PCP: Maudie Flakes MD  REFERRING PROVIDER: Filomena Jungling MD  REFERRING DIAG: Balance problems  Rationale for Evaluation and Treatment:  Rehabilitation  THERAPY DIAG:  Unsteady on feet, Fall risk  ONSET DATE: since 2020  SUBJECTIVE:                                                                                                                                                                                           SUBJECTIVE STATEMENT: Pt reported: " I am here because my leg don't have energy. I am strong but when I get up I struggle for the first few steps and then it imporves but my leg gets tired with ~100 ft of walking. I have to sit down some where otherwise I can't remain standing. When I stand for 10 mins or so my low back gets tired and I have to sit down to take rest. When I go to church, I don't go anywhere to stand without any support and I don't sit without support. With support I can stand longer and safely. I furniture walk intermittently at home. I have had 4 to 5 Back injections for pain which has helped. I have to be careful enough to lift my legs so that I don't tripp."  PERTINENT HISTORY:  Patient is a pleasant 83 y.o. male seen in follow-up for the evaluation of low back and bilateral leg pain as well as right shoulder pain. He is status post bilateral S1 TFESI 09/24/2023 with 100% improvement. He denies any problems or complications postinjection. He rates his pain as a 0/10. It is intermittent, aching. He continues to note weakness in his legs but denies any numbness, tingling or loss of control of bowel or bladder. Pain is chronic. Overall he is happy the results from the prior lumbar epidural steroid injections. He continues to take gabapentin 600 mg 3 times a day Patient is  now out close to 5 years having completed I-125 interstitial implant for Gleason 7 adenocarcinoma the prostate seen today in routine follow-up he is doing well specifically denies any increased lower urinary tract symptoms diarrhea or fatigue. ural injections in his cervical spine in 2022. He is status post L3-S1 decompression in 2018 with Dr.  Myer Haff. He also has a prior C3-4 ACDF.    PAIN:  Are you having pain? No  PRECAUTIONS: Fall  and ICD/Pacemaker  RED FLAGS: None   WEIGHT BEARING RESTRICTIONS: No  FALLS:  Has patient fallen in last 6 months? No  LIVING ENVIRONMENT: Lives with: lives alone Lives in: House/apartment Stairs: Yes: External: 2 steps; on left going up Has following equipment at home: Single point cane  OCCUPATION: Retired  PLOF: Independent  PATIENT GOALS: " I want to be able to walk at least 20 mins without falling and getting tired."  NEXT MD VISIT: None at this point  OBJECTIVE:  Note: Objective measures were completed at Evaluation unless otherwise noted.    DIAGNOSTIC FINDINGS: See chart   PATIENT SURVEYS:  ABC scale 950/1600= 59% LEFS 48/80  COGNITION: Overall cognitive status: Within functional limits for tasks assessed     SENSATION: WFL  MUSCLE LENGTH: Hamstrings: Right 30 deg; Left 40 deg Thomas test: Right Knee 60 deg; Left knee 50 deg  POSTURE: rounded shoulders, forward head, decreased lumbar lordosis, and left pelvic obliquity  PALPATION: No pain elicited.   LUMBAR ROM: Major loss in flex/SB/Rotation/Ext with tightness and without pain    LOWER EXTREMITY ROM:   BLE WFL with calf and Hamstring tightness. L Ankle DF 90 degrees and PF 45 deg, R Ankle DF 7 deg and PF 45deg     LOWER EXTREMITY MMT: BLE WFL    LUMBAR SPECIAL TESTS:  Slump test: Negative, Quadrant test: Negative, Single leg stance test: Positive, FABER test: Positive, and Thomas test: Positive  FUNCTIONAL TESTS:  30 seconds chair stand test 12 reps completed Timed up and go (TUG): 21 secs 10 meter walk test: 13 secs= 1.3 sec/M SLT: unable on BLE Sami tandem and Tandem : Unable on BLE Standing with EC on NBOS: unable.   GAIT: Distance walked: 76ft Assistive device utilized: Single point cane Level of assistance: Modified independence Comments: Pt demonstrates decreased heel strike,  shuffling gait to LLE, decreased swing, wide base of support and fatigues easily.   TREATMENT DATE: 02/17/24                                                                                                                                 Subjective: Patient denies falls since last session. HEP is going well.   Gait belt donned for all standing activities - required CGA-minA throughout due to balance  Therapeutic Act - improved strength as needed to improve performance of CKC activities/functional movements and as needed for power production to prevent fall during episode of large postural perturbation  Nustep level 3 x 8 minutes to improve cardiorespiratory endurance and muscle strength  Sit to stand 2 x 10  Seated heel toe raises with 2# AW 2 x 10 with B UE support  Seated LAQ 2# AW 2 x 10  Ambulation in hallway with 2# AW bilaterally with no AD x 150'  Seated hamstring stretch bilaterally 2 x 30 seconds   Neuromuscular Re-education - for improved sensory integration, static and dynamic postural  control, equilibrium and non-equilibrium coordination as needed for negotiating home and community environment and stepping over obstacles  Fwd step over 1x2 board x 10 each LE-  minA required throughout  Dynamic marching in // bars with 2# AW x 3 laps  Standing step taps 6" step with 2# AW 2 x 10    PATIENT EDUCATION:  Education details:  As above. Person educated: Patient Education method: Explanation Education comprehension: verbalized understanding and needs further education  HOME EXERCISE PROGRAM: Access Code: [email or web address] URL: [email or web address] Date: 01/20/2024 Prepared by: Maylon Peppers  Exercises - Seated Hamstring Stretch  - 2-3 x daily - 5-7 x weekly - 3-5 reps - 30-60 second  hold - Sit to Stand with Arms Crossed  - 2-3 x daily - 5-7 x weekly - 3 sets - 10 reps - Seated March  - 2-3 x daily - 5-7 x weekly - 3 sets - 10 reps - Seated Long Arc Quad  - 2-3 x  daily - 5-7 x weekly - 3 sets - 10 reps - Heel Raises with Counter Support  - 2-3 x daily - 5-7 x weekly - 3 sets - 10 reps    ASSESSMENT:  CLINICAL IMPRESSION:      Session focused on BLE strengthening and dynamic balance on level surfaces. Gait belt donned throughout with patient requiring CGA-minA for balance. Pt will benefit from PT interventions to address impairments and help pt achieve his goals.    OBJECTIVE IMPAIRMENTS: Abnormal gait, decreased activity tolerance, decreased balance, decreased endurance, decreased mobility, difficulty walking, and decreased ROM.   ACTIVITY LIMITATIONS: locomotion level, endurance  and balance and endurance  PARTICIPATION LIMITATIONS: shopping and community activity  PERSONAL FACTORS: 1-2 comorbidities: Hx of prostate and neck surgery  are also affecting patient's functional outcome.   REHAB POTENTIAL: Fair due to chronicity of the symptoms   CLINICAL DECISION MAKING: Stable/uncomplicated  EVALUATION COMPLEXITY: Low   GOALS: Goals reviewed with patient? Yes  SHORT TERM GOALS: Target date: 02/11/2024  Pt will demonstrate Independent with HEP without falls to provide long term benefit of PT interventions and pt safety.  Baseline: Needs further information Goal status: INITIAL  2. Pt will improve hamstring length to 10 degrees in B knees to demonstrate improved LE functions.  Baseline: 40 deg L and 30 deg R.    LONG TERM GOALS: Target date: 03/24/2024  Pt will improve TUG to 15 secs to reduce fall risk Baseline: 21 secs Goal status: INITIAL  2.  Pt will improve 56M gait speed to 39M/sec without AD  to demonstrate improve community ambulation and safety Baseline: 1.3sec/M Goal status: INITIAL  3.  Pt will be able to SLT for 6 secs without LOB to reduce fall risk Baseline: Unable Goal status: INITIAL  4.  Pt will tandem stand for 10 secs without LOB to reduce fall risk Baseline: Unable Goal status: INITIAL  5.  Pt will be able to  walk at least 20 mins with or without AD independently without LOB to demonstrate improved QOL.  Baseline: 152ft max Goal status: INITIAL    PLAN:  PT FREQUENCY: 1x/week  PT DURATION: 10 weeks  PLANNED INTERVENTIONS: 97110-Therapeutic exercises, 97530- Therapeutic activity, [id number]- Neuromuscular re-education, 97535- Self Care, [id number]- Manual therapy, and 97116- Gait training.  PLAN FOR NEXT SESSION: Calf, hamstring, quads and Iliopsoas stretch, NuStep and Dynamic balance activities.    Maylon Peppers, PT, DPT Physical Therapist -   [facility]  10:27 AM,02/17/24

## 2024-02-17 ENCOUNTER — Ambulatory Visit: Payer: Medicare PPO | Admitting: Physical Therapy

## 2024-02-17 ENCOUNTER — Encounter: Payer: Self-pay | Admitting: Physical Therapy

## 2024-02-17 DIAGNOSIS — M6289 Other specified disorders of muscle: Secondary | ICD-10-CM

## 2024-02-17 DIAGNOSIS — R262 Difficulty in walking, not elsewhere classified: Secondary | ICD-10-CM | POA: Diagnosis not present

## 2024-02-17 DIAGNOSIS — M6281 Muscle weakness (generalized): Secondary | ICD-10-CM | POA: Diagnosis not present

## 2024-02-17 DIAGNOSIS — R2681 Unsteadiness on feet: Secondary | ICD-10-CM | POA: Diagnosis not present

## 2024-02-18 DIAGNOSIS — H9311 Tinnitus, right ear: Secondary | ICD-10-CM | POA: Diagnosis not present

## 2024-02-22 ENCOUNTER — Encounter: Payer: Medicare PPO | Admitting: Physical Therapy

## 2024-02-24 ENCOUNTER — Ambulatory Visit: Payer: Medicare PPO | Attending: Physical Medicine & Rehabilitation | Admitting: Physical Therapy

## 2024-02-24 ENCOUNTER — Encounter: Payer: Self-pay | Admitting: Physical Therapy

## 2024-02-24 DIAGNOSIS — R2681 Unsteadiness on feet: Secondary | ICD-10-CM | POA: Diagnosis not present

## 2024-02-24 DIAGNOSIS — R262 Difficulty in walking, not elsewhere classified: Secondary | ICD-10-CM | POA: Insufficient documentation

## 2024-02-24 DIAGNOSIS — M6281 Muscle weakness (generalized): Secondary | ICD-10-CM | POA: Diagnosis not present

## 2024-02-24 DIAGNOSIS — M6289 Other specified disorders of muscle: Secondary | ICD-10-CM | POA: Insufficient documentation

## 2024-02-24 NOTE — Therapy (Signed)
 OUTPATIENT PHYSICAL THERAPY TREATMENT   Patient Name: Richard Alexander MRN: [id number] DOB:01-Jan-1941, 83 y.o., male Today's Date: 02/24/2024  END OF SESSION:  PT End of Session - 02/24/24 1030     Visit Number 6    Number of Visits 10    Date for PT Re-Evaluation 03/24/24    Authorization Type [employer]    PT Start Time 1033    PT Stop Time 1121    PT Time Calculation (min) 48 min    Activity Tolerance No increased pain    Behavior During Therapy WFL for tasks assessed/performed              Past Medical History:  Diagnosis Date   Adenomatous polyp    Allergic rhinitis    Aortic atherosclerosis (HCC)    Arthritis    BPH (benign prostatic hyperplasia)    DDD (degenerative disc disease), cervical    Diuretic-induced hypokalemia    Erectile dysfunction    Esophageal diverticulum    HLD (hyperlipidemia)    Hypertension    Insomnia    LBBB (left bundle branch block)    a.) noted on MPI performed 09/17/2016   Lumbar spinal stenosis    OSA on CPAP    Photoallergic dermatitis    Presence of permanent cardiac pacemaker    a.) Medtronic device   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   CARPAL TUNNEL RELEASE Right 10/28/2021   Procedure: ENDOSCOPIC RIGHT CARPAL TUNNEL RELEASE AND SUBCUTANEOUS ANTERIOR TRANSPOSITION OF ULNAR NERVE AT RIGHT ELBOW.;  Surgeon: Christena Flake, MD;  Location: [facility] ORS;  Service: Orthopedics;  Laterality: Right;   COLONOSCOPY WITH PROPOFOL N/A 07/25/2018   Procedure: COLONOSCOPY WITH PROPOFOL;  Surgeon: Scot Jun, MD;  Location: [facility] ENDOSCOPY;  Service: Endoscopy;  Laterality: N/A;   HAMMER TOE SURGERY Right 2007   2nd toe   LUMBAR LAMINECTOMY/DECOMPRESSION MICRODISCECTOMY Bilateral  10/28/2016   Procedure: LUMBAR LAMINECTOMY/DECOMPRESSION MICRODISCECTOMY 3 LEVELS;  Surgeon: Venetia Night, MD;  Location: [facility] ORS;  Service: Neurosurgery;  Laterality: Bilateral;  L3-4, L4-5,L5-S1 Laminectomies and bilateral foraminotomies   PACEMAKER INSERTION  2011   PPM GENERATOR CHANGEOUT N/A 12/17/2020   Procedure: PPM GENERATOR CHANGEOUT;  Surgeon: Marcina Millard, MD;  Location: [facility] INVASIVE CV LAB;  Service: Cardiovascular;  Laterality: N/A;   RADIOACTIVE SEED IMPLANT N/A 11/07/2018   Procedure: RADIOACTIVE SEED IMPLANT/BRACHYTHERAPY IMPLANT;  Surgeon: Vanna Scotland, MD;  Location: [facility] ORS;  Service: Urology;  Laterality: N/A;   Patient Active Problem List   Diagnosis Date Noted   Hyperlipidemia 05/28/2020   Claudication (HCC) 05/28/2020   Primary osteoarthritis of right shoulder 11/01/2018   Prostate cancer (HCC) 09/30/2018   Obesity (BMI 35.0-39.9 without comorbidity) 06/21/2017   BPH with elevated PSA 11/23/2016   Lumbar stenosis with neurogenic claudication 10/28/2016   Allergic rhinitis 10/20/2016   Arthritis 10/20/2016   Erectile dysfunction 10/20/2016   Photoallergic dermatitis 10/20/2016   Rotator cuff syndrome of left shoulder 10/20/2016   Left leg numbness 04/09/2016   Lumbosacral radiculopathy at S1 01/23/2016   High risk medication use 09/25/2015   Anemia due to medication 09/17/2014   History of hypokalemia 09/17/2014   Mild anemia 09/17/2014   Diuretic-induced hypokalemia 09/17/2014  DDD (degenerative disc disease), cervical 09/16/2014   History of adenomatous polyp of colon 09/16/2014   Insomnia 09/16/2014   OSA on CPAP 09/16/2014   Benign essential hypertension 04/18/2014   Sinoatrial node dysfunction (HCC) 04/18/2014   Pure hypercholesterolemia 04/18/2014    PCP: Maudie Flakes MD  REFERRING PROVIDER: Filomena Jungling MD  REFERRING DIAG: Balance problems  Rationale for Evaluation and Treatment: Rehabilitation  THERAPY DIAG:   Unsteady on feet, Fall risk  ONSET DATE: since 2020  SUBJECTIVE:                                                                                                                                                                                           SUBJECTIVE STATEMENT: Pt reported: " I am here because my leg don't have energy. I am strong but when I get up I struggle for the first few steps and then it improves but my leg gets tired with ~100 ft of walking. I have to sit down some where otherwise I can't remain standing. When I stand for 10 mins or so my low back gets tired and I have to sit down to take rest. When I go to church, I don't go anywhere to stand without any support and I don't sit without support. With support I can stand longer and safely. I furniture walk intermittently at home. I have had 4 to 5 Back injections for pain which has helped. I have to be careful enough to lift my legs so that I don't tripp."  PERTINENT HISTORY:  Patient is a pleasant 83 y.o. male seen in follow-up for the evaluation of low back and bilateral leg pain as well as right shoulder pain. He is status post bilateral S1 TFESI 09/24/2023 with 100% improvement. He denies any problems or complications postinjection. He rates his pain as a 0/10. It is intermittent, aching. He continues to note weakness in his legs but denies any numbness, tingling or loss of control of bowel or bladder. Pain is chronic. Overall he is happy the results from the prior lumbar epidural steroid injections. He continues to take gabapentin 600 mg 3 times a day Patient is  now out close to 5 years having completed I-125 interstitial implant for Gleason 7 adenocarcinoma the prostate seen today in routine follow-up he is doing well specifically denies any increased lower urinary tract symptoms diarrhea or fatigue. ural injections in his cervical spine in 2022. He is status post L3-S1 decompression in 2018 with Dr. Myer Haff. He also has a prior  C3-4 ACDF.    PAIN:  Are you having pain? No  PRECAUTIONS: Fall and ICD/Pacemaker  RED FLAGS: None   WEIGHT BEARING RESTRICTIONS: No  FALLS:  Has patient fallen in last 6 months? No  LIVING ENVIRONMENT: Lives with: lives alone Lives in: House/apartment Stairs: Yes: External: 2 steps; on left going up Has following equipment at home: Single point cane  OCCUPATION: Retired  PLOF: Independent  PATIENT GOALS: " I want to be able to walk at least 20 mins without falling and getting tired."  NEXT MD VISIT: None at this point  OBJECTIVE:  Note: Objective measures were completed at Evaluation unless otherwise noted.    DIAGNOSTIC FINDINGS: See chart   PATIENT SURVEYS:  ABC scale 950/1600= 59% LEFS 48/80  COGNITION: Overall cognitive status: Within functional limits for tasks assessed     SENSATION: WFL  MUSCLE LENGTH: Hamstrings: Right 30 deg; Left 40 deg Thomas test: Right Knee 60 deg; Left knee 50 deg  POSTURE: rounded shoulders, forward head, decreased lumbar lordosis, and left pelvic obliquity  PALPATION: No pain elicited.   LUMBAR ROM: Major loss in flex/SB/Rotation/Ext with tightness and without pain    LOWER EXTREMITY ROM:   BLE WFL with calf and Hamstring tightness. L Ankle DF 90 degrees and PF 45 deg, R Ankle DF 7 deg and PF 45deg     LOWER EXTREMITY MMT: BLE WFL    LUMBAR SPECIAL TESTS:  Slump test: Negative, Quadrant test: Negative, Single leg stance test: Positive, FABER test: Positive, and Thomas test: Positive  FUNCTIONAL TESTS:  30 seconds chair stand test 12 reps completed Timed up and go (TUG): 21 secs 10 meter walk test: 13 secs= 1.3 sec/M SLT: unable on BLE Sami tandem and Tandem : Unable on BLE Standing with EC on NBOS: unable.   GAIT: Distance walked: 62ft Assistive device utilized: Single point cane Level of assistance: Modified independence Comments: Pt demonstrates decreased heel strike, shuffling gait to LLE, decreased  swing, wide base of support and fatigues easily.    TREATMENT DATE: 02/24/24                                                                                                                                 Subjective: Patient reports no significant concerns or changes since last visit. Pt reports doing well with established HEP.    Gait belt donned for all standing activities - required SBA/CGA throughout due to balance  Therapeutic Act - improved strength as needed to improve performance of CKC activities/functional movements and as needed for power production to prevent fall during episode of large postural perturbation  Nustep level 3 x 7 minutes to improve cardiorespiratory endurance and muscle strength   Standing heel toe raises; 2 x 10 with B UE support  Ambulation in hallway with 4# AW bilaterally with no AD x 210'  Seated hamstring stretch bilaterally 2 x 30 seconds  Standing gastrocnemius stretch; 2 x 30 sec, bilat Minisquat; facing mirror for technique/visual feedback; 2x10  PATIENT EDUCATION: Discussed options for at-home affordable  cycling equipment. HEP updated and reviewed.    *not today* Seated LAQ 2# AW 2 x 10  Sit to stand 2 x 10   Neuromuscular Re-education - for improved sensory integration, static and dynamic postural control, equilibrium and non-equilibrium coordination as needed for negotiating home and community environment and stepping over obstacles  Fwd step over (3) 1x4 boards in // bars; x 4 D/B length of parallel bars, intermittent UE touch on bar Dynamic marching in // bars with 2# AW x 4 laps D/B Standing step taps 6" step; 2x10 Standing march with slow eccentric phase as able; 2x10  -reviewed for HEP   PATIENT EDUCATION:  Education details:  As above. Person educated: Patient Education method: Explanation Education comprehension: verbalized understanding and needs further education  HOME EXERCISE PROGRAM: Access Code: [email or web address] URL:  [email or web address] Date: 01/20/2024 Prepared by: Maylon Peppers  Exercises - Seated Hamstring Stretch  - 2-3 x daily - 5-7 x weekly - 3-5 reps - 30-60 second  hold - Sit to Stand with Arms Crossed  - 2-3 x daily - 5-7 x weekly - 3 sets - 10 reps - Seated March  - 2-3 x daily - 5-7 x weekly - 3 sets - 10 reps - Seated Long Arc Quad  - 2-3 x daily - 5-7 x weekly - 3 sets - 10 reps - Heel Raises with Counter Support  - 2-3 x daily - 5-7 x weekly - 3 sets - 10 reps    ASSESSMENT:  CLINICAL IMPRESSION:      We continued with LE strengthening with increasing emphasis on closed-chain movements and discussed use of cycling equipment at home to promote improved muscular endurance. Pt exhibits decreased single-limb support time - we worked on additional drills for control of weight shift from one LE to the other. Exercise volume/intensity was modestly progressed and HEP was updated today; we reviewed safe techniques for at-home exercise. Pt will benefit from PT interventions to address impairments and help pt achieve his goals.    OBJECTIVE IMPAIRMENTS: Abnormal gait, decreased activity tolerance, decreased balance, decreased endurance, decreased mobility, difficulty walking, and decreased ROM.   ACTIVITY LIMITATIONS: locomotion level, endurance  and balance and endurance  PARTICIPATION LIMITATIONS: shopping and community activity  PERSONAL FACTORS: 1-2 comorbidities: Hx of prostate and neck surgery  are also affecting patient's functional outcome.   REHAB POTENTIAL: Fair due to chronicity of the symptoms   CLINICAL DECISION MAKING: Stable/uncomplicated  EVALUATION COMPLEXITY: Low   GOALS: Goals reviewed with patient? Yes  SHORT TERM GOALS: Target date: 02/11/2024  Pt will demonstrate Independent with HEP without falls to provide long term benefit of PT interventions and pt safety.  Baseline: Needs further information Goal status: INITIAL  2. Pt will improve hamstring  length to 10 degrees in B knees to demonstrate improved LE functions.  Baseline: 40 deg L and 30 deg R.    LONG TERM GOALS: Target date: 03/24/2024  Pt will improve TUG to 15 secs to reduce fall risk Baseline: 21 secs Goal status: INITIAL  2.  Pt will improve 164M gait speed to 64M/sec without AD  to demonstrate improve community ambulation and safety Baseline: 1.3sec/M Goal status: INITIAL  3.  Pt will be able to SLT for 6 secs without LOB to reduce fall risk Baseline: Unable Goal status: INITIAL  4.  Pt will tandem stand for 10 secs without LOB to reduce fall risk Baseline: Unable Goal status: INITIAL  5.  Pt will be able to walk at  least 20 mins with or without AD independently without LOB to demonstrate improved QOL.  Baseline: 113ft max Goal status: INITIAL    PLAN:  PT FREQUENCY: 1x/week  PT DURATION: 10 weeks  PLANNED INTERVENTIONS: 97110-Therapeutic exercises, 97530- Therapeutic activity, [id number]- Neuromuscular re-education, 97535- Self Care, 40981- Manual therapy, and 97116- Gait training.  PLAN FOR NEXT SESSION: Calf, hamstring, quads and Iliopsoas stretch, NuStep and Dynamic balance activities.    Consuela Mimes, PT, DPT [phone]  Physical Therapist - [place]  [facility]  11:50 AM,02/24/24

## 2024-03-08 DIAGNOSIS — I495 Sick sinus syndrome: Secondary | ICD-10-CM | POA: Diagnosis not present

## 2024-03-21 DIAGNOSIS — H40023 Open angle with borderline findings, high risk, bilateral: Secondary | ICD-10-CM | POA: Diagnosis not present

## 2024-03-27 ENCOUNTER — Other Ambulatory Visit: Payer: Self-pay | Admitting: Urology

## 2024-03-27 DIAGNOSIS — R399 Unspecified symptoms and signs involving the genitourinary system: Secondary | ICD-10-CM

## 2024-03-28 DIAGNOSIS — H9311 Tinnitus, right ear: Secondary | ICD-10-CM | POA: Diagnosis not present

## 2024-03-28 DIAGNOSIS — H9203 Otalgia, bilateral: Secondary | ICD-10-CM | POA: Diagnosis not present

## 2024-04-10 DIAGNOSIS — S20211A Contusion of right front wall of thorax, initial encounter: Secondary | ICD-10-CM | POA: Diagnosis not present

## 2024-04-10 DIAGNOSIS — G4733 Obstructive sleep apnea (adult) (pediatric): Secondary | ICD-10-CM | POA: Diagnosis not present

## 2024-04-18 DIAGNOSIS — I495 Sick sinus syndrome: Secondary | ICD-10-CM | POA: Diagnosis not present

## 2024-04-18 DIAGNOSIS — G4733 Obstructive sleep apnea (adult) (pediatric): Secondary | ICD-10-CM | POA: Diagnosis not present

## 2024-04-18 DIAGNOSIS — E7800 Pure hypercholesterolemia, unspecified: Secondary | ICD-10-CM | POA: Diagnosis not present

## 2024-04-18 DIAGNOSIS — I700 Atherosclerosis of aorta: Secondary | ICD-10-CM | POA: Diagnosis not present

## 2024-04-18 DIAGNOSIS — I10 Essential (primary) hypertension: Secondary | ICD-10-CM | POA: Diagnosis not present

## 2024-05-02 DIAGNOSIS — H903 Sensorineural hearing loss, bilateral: Secondary | ICD-10-CM | POA: Diagnosis not present

## 2024-05-02 DIAGNOSIS — H9313 Tinnitus, bilateral: Secondary | ICD-10-CM | POA: Diagnosis not present

## 2024-06-05 DIAGNOSIS — Z6838 Body mass index (BMI) 38.0-38.9, adult: Secondary | ICD-10-CM | POA: Diagnosis not present

## 2024-06-05 DIAGNOSIS — E119 Type 2 diabetes mellitus without complications: Secondary | ICD-10-CM | POA: Diagnosis not present

## 2024-06-05 DIAGNOSIS — M19011 Primary osteoarthritis, right shoulder: Secondary | ICD-10-CM | POA: Diagnosis not present

## 2024-06-05 DIAGNOSIS — M19012 Primary osteoarthritis, left shoulder: Secondary | ICD-10-CM | POA: Diagnosis not present

## 2024-07-10 DIAGNOSIS — G4733 Obstructive sleep apnea (adult) (pediatric): Secondary | ICD-10-CM | POA: Diagnosis not present

## 2024-07-12 DIAGNOSIS — Z0000 Encounter for general adult medical examination without abnormal findings: Secondary | ICD-10-CM | POA: Diagnosis not present

## 2024-07-12 DIAGNOSIS — N3281 Overactive bladder: Secondary | ICD-10-CM | POA: Diagnosis not present

## 2024-07-12 DIAGNOSIS — I119 Hypertensive heart disease without heart failure: Secondary | ICD-10-CM | POA: Diagnosis not present

## 2024-07-12 DIAGNOSIS — I10 Essential (primary) hypertension: Secondary | ICD-10-CM | POA: Diagnosis not present

## 2024-07-12 DIAGNOSIS — E669 Obesity, unspecified: Secondary | ICD-10-CM | POA: Diagnosis not present

## 2024-07-12 DIAGNOSIS — K219 Gastro-esophageal reflux disease without esophagitis: Secondary | ICD-10-CM | POA: Diagnosis not present

## 2024-07-12 DIAGNOSIS — E119 Type 2 diabetes mellitus without complications: Secondary | ICD-10-CM | POA: Diagnosis not present

## 2024-07-12 DIAGNOSIS — N5201 Erectile dysfunction due to arterial insufficiency: Secondary | ICD-10-CM | POA: Diagnosis not present

## 2024-07-12 DIAGNOSIS — I495 Sick sinus syndrome: Secondary | ICD-10-CM | POA: Diagnosis not present

## 2024-07-12 DIAGNOSIS — Z8546 Personal history of malignant neoplasm of prostate: Secondary | ICD-10-CM | POA: Diagnosis not present

## 2024-07-12 DIAGNOSIS — Z125 Encounter for screening for malignant neoplasm of prostate: Secondary | ICD-10-CM | POA: Diagnosis not present

## 2024-07-12 DIAGNOSIS — I700 Atherosclerosis of aorta: Secondary | ICD-10-CM | POA: Diagnosis not present

## 2024-07-12 DIAGNOSIS — N400 Enlarged prostate without lower urinary tract symptoms: Secondary | ICD-10-CM | POA: Diagnosis not present

## 2024-07-12 DIAGNOSIS — Z79899 Other long term (current) drug therapy: Secondary | ICD-10-CM | POA: Diagnosis not present

## 2024-07-12 DIAGNOSIS — D649 Anemia, unspecified: Secondary | ICD-10-CM | POA: Diagnosis not present

## 2024-07-12 DIAGNOSIS — E7800 Pure hypercholesterolemia, unspecified: Secondary | ICD-10-CM | POA: Diagnosis not present

## 2024-07-24 ENCOUNTER — Other Ambulatory Visit: Payer: Self-pay

## 2024-07-24 DIAGNOSIS — R399 Unspecified symptoms and signs involving the genitourinary system: Secondary | ICD-10-CM

## 2024-07-24 DIAGNOSIS — C61 Malignant neoplasm of prostate: Secondary | ICD-10-CM

## 2024-07-24 DIAGNOSIS — N529 Male erectile dysfunction, unspecified: Secondary | ICD-10-CM

## 2024-07-25 LAB — PSA: Prostate Specific Ag, Serum: <0.1 ng/mL (ref 0.0–4.0)

## 2024-07-25 NOTE — Progress Notes (Unsigned)
 07/27/2024 9:55 PM   Richard Alexander December 02, 1941 [id number]  Referring provider: Jeffie Cheryl BRAVO, MD [address] [place],  [place] [address]  Urological history: 1. Prostate cancer -PSA (07/2024) <0.1  -prostate biopsy on 08/12/2018 for an elevated PSA rising on finasteride , 5.3 at the time of biopsy (10.6 adjusted).  He was also noted to have induration on the right side of his prostate gland on rectal exam.  Transrectal ultrasound volume 26 g.  Prostate biopsy showed 7/12 cores positive of Gleason 3+4 and 3+3 prostate cancer.  His primary tumor was on the right with 3 cores of Gleason 3+4 involving up to 86% of the tissue right apex and mid gland.  The remainder of the prostate showed low volume Gleason 3+3 dispersed throughout the prostate -Patient had 1-125 prostate seeds implanted on 11/07/2018 -ADT completed 10/2019 -Released from the cancer center care in April 2024   2. BPH with LU TS - tamsulosin  0.4 mg daily and solifenacin  5 mg daily    3. ED - Contributing factors of age, prostate cancer, pelvic radiation, hypertension, CAD, OSA, degenerative disc disease in the lumbar area, hyperlipidemia and history of smoking - taking tadalafil  20 mg on demand dosing   No chief complaint on file.  HPI: Richard Alexander is a 83 y.o. male who presents today for one year follow up.    Previous records reviewed.     PSA (07/2024) <0.1  Serum creatinine (06/2024) 1.0, eGFR 75  Hbg A1c (06/2024) 6.7  Total cholesterol (06/2024) 169  PMH: Past Medical History:  Diagnosis Date   Adenomatous polyp    Allergic rhinitis    Aortic atherosclerosis (HCC)    Arthritis    BPH (benign prostatic hyperplasia)    DDD (degenerative disc disease), cervical    Diuretic-induced hypokalemia    Erectile dysfunction    Esophageal diverticulum    HLD (hyperlipidemia)    Hypertension    Insomnia    LBBB (left bundle branch block)    a.) noted on MPI performed 09/17/2016   Lumbar spinal  stenosis    OSA on CPAP    Photoallergic dermatitis    Presence of permanent cardiac pacemaker    a.) Medtronic device   Prostate cancer [facility])    a.) 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   CARPAL TUNNEL RELEASE Right 10/28/2021   Procedure: ENDOSCOPIC RIGHT CARPAL TUNNEL RELEASE AND SUBCUTANEOUS ANTERIOR TRANSPOSITION OF ULNAR NERVE AT RIGHT ELBOW.;  Surgeon: Richard Norleen PARAS, MD;  Location: [facility] ORS;  Service: Orthopedics;  Laterality: Right;   COLONOSCOPY WITH PROPOFOL  N/A 07/25/2018   Procedure: COLONOSCOPY WITH PROPOFOL ;  Surgeon: Richard Lamar DASEN, MD;  Location: [facility] ENDOSCOPY;  Service: Endoscopy;  Laterality: N/A;   HAMMER TOE SURGERY Right 2007   2nd toe   LUMBAR LAMINECTOMY/DECOMPRESSION MICRODISCECTOMY Bilateral 10/28/2016   Procedure: LUMBAR LAMINECTOMY/DECOMPRESSION MICRODISCECTOMY 3 LEVELS;  Surgeon: Richard Daisy, MD;  Location: [facility] ORS;  Service: Neurosurgery;  Laterality: Bilateral;  L3-4, L4-5,L5-S1 Laminectomies and bilateral foraminotomies   PACEMAKER INSERTION  2011   PPM GENERATOR CHANGEOUT N/A 12/17/2020   Procedure: PPM GENERATOR CHANGEOUT;  Surgeon: Richard Blunt, MD;  Location: [facility] INVASIVE CV LAB;  Service: Cardiovascular;  Laterality: N/A;   RADIOACTIVE SEED  IMPLANT N/A 11/07/2018   Procedure: RADIOACTIVE SEED IMPLANT/BRACHYTHERAPY IMPLANT;  Surgeon: Richard Knee, MD;  Location: [facility] ORS;  Service: Urology;  Laterality: N/A;    Home Medications:  Allergies as of 07/27/2024   No Known Allergies      Medication List        Accurate as of July 25, 2024  9:55 PM. If you have any questions, ask your nurse or doctor.          acetaminophen  500 MG tablet Commonly known as: TYLENOL  Take 1,000 mg by mouth  every 6 (six) hours as needed (for pain.).   aspirin EC 81 MG tablet Take 81 mg by mouth daily.   Calcium 600-200 MG-UNIT tablet Take 1 tablet by mouth 2 (two) times daily. Bone Health   chlorpheniramine 4 MG tablet Commonly known as: CHLOR-TRIMETON Take 4 mg by mouth every 4 (four) hours as needed for allergies or rhinitis.   cyanocobalamin 1000 MCG tablet Commonly known as: VITAMIN B12 Take 1,000 mcg by mouth daily.   docusate sodium  100 MG capsule Commonly known as: COLACE Take 2 capsules (200 mg total) by mouth daily.   fexofenadine 180 MG tablet Commonly known as: ALLEGRA Take 180 mg by mouth daily as needed for allergies or rhinitis.   fluocinonide cream 0.05 % Commonly known as: LIDEX Apply 1 application topically 2 (two) times daily as needed (for skin irritation).   gabapentin  300 MG capsule Commonly known as: NEURONTIN  Take 600 mg by mouth 3 (three) times daily.   meclizine 25 MG tablet Commonly known as: ANTIVERT Take 25 mg by mouth daily as needed for dizziness.   polyethylene glycol 17 g packet Commonly known as: MIRALAX  / GLYCOLAX  Take 17 g by mouth daily as needed for mild constipation.   potassium chloride  SA 20 MEQ tablet Commonly known as: KLOR-CON  M Take 20 mEq by mouth daily.   pravastatin  20 MG tablet Commonly known as: PRAVACHOL  Take 20 mg by mouth every evening.   sildenafil  100 MG tablet Commonly known as: VIAGRA  Take 1 tablet (100 mg total) by mouth daily as needed for erectile dysfunction. Take two hours prior to intercourse on an empty stomach   solifenacin  5 MG tablet Commonly known as: VESICARE  TAKE 1 TABLET EVERY DAY   tamsulosin  0.4 MG Caps capsule Commonly known as: FLOMAX  TAKE 1 CAPSULE BY MOUTH ONCE DAILY   triamcinolone  0.025 % cream Commonly known as: KENALOG Apply 1 application topically daily as needed for dry skin.   triamterene-hydrochlorothiazide  37.5-25 MG tablet Commonly known as: MAXZIDE-25 Take 1 tablet by  mouth daily.        Allergies: No Known Allergies  Family History: Family History  Problem Relation Age of Onset   Hypertension Father    Stroke Father    Colon cancer Father    Aortic aneurysm Mother    Prostate cancer Neg Hx    Kidney cancer Neg Hx    Bladder Cancer Neg Hx     Social History:  reports that he quit smoking about 48 years ago. His smoking use included cigarettes. He has never used smokeless tobacco. He reports that he does not drink alcohol  and does not use drugs.  ROS: Pertinent ROS in HPI  Physical Exam: There were no vitals taken for this visit.  Constitutional:  Well nourished. Alert and oriented, No acute distress. HEENT: [place] AT, moist mucus membranes.  Trachea midline, no masses. Cardiovascular: No clubbing, cyanosis, or edema. Respiratory: Normal respiratory effort, no increased work  of breathing. GI: Abdomen is soft, non tender, non distended, no abdominal masses. Liver and spleen not palpable.  No hernias appreciated.  Stool sample for occult testing is not indicated.   GU: No CVA tenderness.  No bladder fullness or masses.  Patient with circumcised/uncircumcised phallus. ***Foreskin easily retracted***  Urethral meatus is patent.  No penile discharge. No penile lesions or rashes. Scrotum without lesions, cysts, rashes and/or edema.  Testicles are located scrotally bilaterally. No masses are appreciated in the testicles. Left and right epididymis are normal. Rectal: Patient with  normal sphincter tone. Anus and perineum without scarring or rashes. No rectal masses are appreciated. Prostate is approximately *** grams, *** nodules are appreciated. Seminal vesicles are normal. Skin: No rashes, bruises or suspicious lesions. Lymph: No cervical or inguinal adenopathy. Neurologic: Grossly intact, no focal deficits, moving all 4 extremities. Psychiatric: Normal mood and affect.   Laboratory Data: See EPIC and HPI  I have reviewed the labs.   Pertinent  Imaging: N/A  Assessment & Plan:    1. Prostate cancer -PSA remains undetectable -continue to check PSA annually  2. BPH with LUTS -continue conservative management, avoiding bladder irritants and timed voiding's -Continue tamsulosin  0.4 mg daily and Vesicare  5 mg daily   3. Erectile dysfunction -He is having good results with PDE 5 inhibitors, he would like a prescription for sildenafil  100 mg sent to the [facility] at this time -He will discontinue the tadalafil  5 mg on the tadalafil  20 mg  No follow-ups on file.  These notes generated with voice recognition software. I apologize for typographical errors.  CLOTILDA HELON RIGGERS  [facility] [address]  [address] [place], [place] [address] [phone]

## 2024-07-27 ENCOUNTER — Encounter: Payer: Self-pay | Admitting: Urology

## 2024-07-27 ENCOUNTER — Ambulatory Visit: Payer: Self-pay | Admitting: Urology

## 2024-07-27 VITALS — BP 117/70 | HR 91 | Ht 63.0 in | Wt 208.0 lb

## 2024-07-27 DIAGNOSIS — N529 Male erectile dysfunction, unspecified: Secondary | ICD-10-CM

## 2024-07-27 DIAGNOSIS — R399 Unspecified symptoms and signs involving the genitourinary system: Secondary | ICD-10-CM | POA: Diagnosis not present

## 2024-07-27 DIAGNOSIS — C61 Malignant neoplasm of prostate: Secondary | ICD-10-CM | POA: Diagnosis not present

## 2024-07-27 MED ORDER — SOLIFENACIN SUCCINATE 5 MG PO TABS: 5.0000 mg | ORAL_TABLET | Freq: Every day | ORAL | 3 refills | Status: AC

## 2024-07-27 MED ORDER — TAMSULOSIN HCL 0.4 MG PO CAPS
0.4000 mg | ORAL_CAPSULE | Freq: Every day | ORAL | 3 refills | Status: AC
Start: 2024-07-27 — End: ?

## 2024-07-27 MED ORDER — SILDENAFIL CITRATE 100 MG PO TABS: 100.0000 mg | ORAL_TABLET | Freq: Every day | ORAL | 3 refills | Status: AC | PRN

## 2024-07-31 DIAGNOSIS — L723 Sebaceous cyst: Secondary | ICD-10-CM | POA: Diagnosis not present

## 2024-07-31 DIAGNOSIS — Z1331 Encounter for screening for depression: Secondary | ICD-10-CM | POA: Diagnosis not present

## 2024-08-15 DIAGNOSIS — H40023 Open angle with borderline findings, high risk, bilateral: Secondary | ICD-10-CM | POA: Diagnosis not present

## 2024-08-15 DIAGNOSIS — H04123 Dry eye syndrome of bilateral lacrimal glands: Secondary | ICD-10-CM | POA: Diagnosis not present

## 2024-08-15 DIAGNOSIS — H26493 Other secondary cataract, bilateral: Secondary | ICD-10-CM | POA: Diagnosis not present

## 2024-08-15 DIAGNOSIS — I10 Essential (primary) hypertension: Secondary | ICD-10-CM | POA: Diagnosis not present

## 2024-08-24 DIAGNOSIS — E119 Type 2 diabetes mellitus without complications: Secondary | ICD-10-CM | POA: Diagnosis not present

## 2024-08-24 DIAGNOSIS — M5416 Radiculopathy, lumbar region: Secondary | ICD-10-CM | POA: Diagnosis not present

## 2024-09-05 DIAGNOSIS — I495 Sick sinus syndrome: Secondary | ICD-10-CM | POA: Diagnosis not present

## 2024-09-07 DIAGNOSIS — M5441 Lumbago with sciatica, right side: Secondary | ICD-10-CM | POA: Diagnosis not present

## 2024-09-07 DIAGNOSIS — G8929 Other chronic pain: Secondary | ICD-10-CM | POA: Diagnosis not present

## 2024-09-07 DIAGNOSIS — M5416 Radiculopathy, lumbar region: Secondary | ICD-10-CM | POA: Diagnosis not present

## 2024-09-07 DIAGNOSIS — M5442 Lumbago with sciatica, left side: Secondary | ICD-10-CM | POA: Diagnosis not present

## 2024-09-08 ENCOUNTER — Other Ambulatory Visit: Payer: Self-pay | Admitting: Physical Medicine & Rehabilitation

## 2024-09-08 DIAGNOSIS — G8929 Other chronic pain: Secondary | ICD-10-CM

## 2024-09-11 ENCOUNTER — Encounter: Payer: Self-pay | Admitting: Physical Medicine & Rehabilitation

## 2024-10-09 DIAGNOSIS — I700 Atherosclerosis of aorta: Secondary | ICD-10-CM | POA: Diagnosis not present

## 2024-10-09 DIAGNOSIS — I10 Essential (primary) hypertension: Secondary | ICD-10-CM | POA: Diagnosis not present

## 2024-10-09 DIAGNOSIS — I495 Sick sinus syndrome: Secondary | ICD-10-CM | POA: Diagnosis not present

## 2024-10-09 DIAGNOSIS — E7800 Pure hypercholesterolemia, unspecified: Secondary | ICD-10-CM | POA: Diagnosis not present

## 2024-10-09 DIAGNOSIS — G4733 Obstructive sleep apnea (adult) (pediatric): Secondary | ICD-10-CM | POA: Diagnosis not present

## 2024-10-10 DIAGNOSIS — G4733 Obstructive sleep apnea (adult) (pediatric): Secondary | ICD-10-CM | POA: Diagnosis not present

## 2025-07-27 ENCOUNTER — Ambulatory Visit: Admitting: Urology
